# Patient Record
Sex: Female | Born: 1945
Health system: Southern US, Community
[De-identification: ages and names within clinical notes are randomized; demographics above are authoritative.]

## PROBLEM LIST (undated history)

## (undated) DIAGNOSIS — J45909 Unspecified asthma, uncomplicated: Secondary | ICD-10-CM

## (undated) DIAGNOSIS — M5430 Sciatica, unspecified side: Secondary | ICD-10-CM

## (undated) DIAGNOSIS — M545 Low back pain, unspecified: Secondary | ICD-10-CM

## (undated) DIAGNOSIS — I219 Acute myocardial infarction, unspecified: Secondary | ICD-10-CM

## (undated) DIAGNOSIS — J189 Pneumonia, unspecified organism: Secondary | ICD-10-CM

## (undated) DIAGNOSIS — G8929 Other chronic pain: Secondary | ICD-10-CM

## (undated) DIAGNOSIS — E7800 Pure hypercholesterolemia, unspecified: Secondary | ICD-10-CM

## (undated) DIAGNOSIS — I2510 Atherosclerotic heart disease of native coronary artery without angina pectoris: Secondary | ICD-10-CM

## (undated) DIAGNOSIS — I10 Essential (primary) hypertension: Secondary | ICD-10-CM

## (undated) HISTORY — DX: Essential (primary) hypertension: I10

## (undated) HISTORY — DX: Sciatica, unspecified side: M54.30

## (undated) HISTORY — DX: Acute myocardial infarction, unspecified: I21.9

---

## 1978-07-02 HISTORY — PX: SALPINGECTOMY: SHX328

## 2000-07-02 DIAGNOSIS — I219 Acute myocardial infarction, unspecified: Secondary | ICD-10-CM

## 2000-07-02 HISTORY — DX: Acute myocardial infarction, unspecified: I21.9

## 2000-07-02 HISTORY — PX: CORONARY ARTERY BYPASS GRAFT: SHX141

## 2015-05-02 ENCOUNTER — Ambulatory Visit: Payer: Medicare Other | Attending: Family Medicine | Admitting: Family Medicine

## 2015-05-02 ENCOUNTER — Encounter: Payer: Self-pay | Admitting: Family Medicine

## 2015-05-02 VITALS — BP 151/82 | HR 70 | Temp 98.1°F | Resp 16 | Ht 64.0 in | Wt 181.0 lb

## 2015-05-02 DIAGNOSIS — I252 Old myocardial infarction: Secondary | ICD-10-CM

## 2015-05-02 DIAGNOSIS — Z91048 Other nonmedicinal substance allergy status: Secondary | ICD-10-CM

## 2015-05-02 DIAGNOSIS — M2550 Pain in unspecified joint: Secondary | ICD-10-CM

## 2015-05-02 DIAGNOSIS — I10 Essential (primary) hypertension: Secondary | ICD-10-CM | POA: Diagnosis not present

## 2015-05-02 DIAGNOSIS — K219 Gastro-esophageal reflux disease without esophagitis: Secondary | ICD-10-CM | POA: Diagnosis not present

## 2015-05-02 DIAGNOSIS — Z9109 Other allergy status, other than to drugs and biological substances: Secondary | ICD-10-CM

## 2015-05-02 DIAGNOSIS — G4700 Insomnia, unspecified: Secondary | ICD-10-CM | POA: Diagnosis not present

## 2015-05-02 MED ORDER — ENALAPRIL MALEATE 20 MG PO TABS: 20.0000 mg | ORAL_TABLET | Freq: Every day | ORAL | Status: DC

## 2015-05-02 MED ORDER — MONTELUKAST SODIUM 10 MG PO TABS: 10.0000 mg | ORAL_TABLET | Freq: Every day | ORAL | Status: DC

## 2015-05-02 MED ORDER — ASPIRIN 81 MG PO TABS: 81.0000 mg | ORAL_TABLET | Freq: Every day | ORAL | Status: DC

## 2015-05-02 MED ORDER — METOPROLOL SUCCINATE ER 100 MG PO TB24: 100.0000 mg | ORAL_TABLET | Freq: Every day | ORAL | Status: DC

## 2015-05-02 MED ORDER — TRAZODONE HCL 50 MG PO TABS: 25.0000 mg | ORAL_TABLET | Freq: Every evening | ORAL | Status: DC | PRN

## 2015-05-02 MED ORDER — ESOMEPRAZOLE MAGNESIUM 40 MG PO CPDR: 40.0000 mg | DELAYED_RELEASE_CAPSULE | Freq: Every day | ORAL | Status: DC | PRN

## 2015-05-02 MED ORDER — MELOXICAM 15 MG PO TABS: 15.0000 mg | ORAL_TABLET | Freq: Every day | ORAL | Status: DC | PRN

## 2015-05-02 NOTE — Progress Notes (Signed)
Patient ID: [name], female   DOB: 10-01-1945, 69 y.o.   MRN: [id number]   Subjective:  Patient ID: [name], female    DOB: 05-28-1946  Age: 69 y.o. MRN: [id number]  CC: Hypertension and Insomnia   HPI Latondra Gebhart presents for    1. HTN: taking lopressor 100 mg daily. No HA, CP, SOB. Intermittent palpitations.   2. Hx of MI: in 2002. S/p quadruple bypass. She has not established with cardiology since moving to [place]. Taking ASA. No CP or SOB at rest. Stable chest tightness with exertion. Not on statin. Tried lipitor with leg pains in the past.   3. Insomnia: requesting BZ which she used intermittently for anxiety, trouble sleeping and palpitations.   Past Medical History  Diagnosis Date  . Hypertension Dx 1990  . Myocardial infarction [facility]) 2002    Past Surgical History  Procedure Laterality Date  . Coronary artery bypass graft  2002    Quadruple Bypass in [place]    Family History  Problem Relation Age of Onset  . Heart disease Mother   . Heart disease Father   . Heart disease Brother     Social History  Substance Use Topics  . Smoking status: Never Smoker   . Smokeless tobacco: Not on file  . Alcohol Use: No    ROS Review of Systems  Constitutional: Negative for fever and chills.  Eyes: Negative for visual disturbance.  Respiratory: Positive for chest tightness. Negative for shortness of breath.   Cardiovascular: Negative for chest pain.  Gastrointestinal: Negative for abdominal pain and blood in stool.  Musculoskeletal: Negative for back pain and arthralgias.  Skin: Negative for rash.  Allergic/Immunologic: Negative for immunocompromised state.  Hematological: Negative for adenopathy. Does not bruise/bleed easily.  Psychiatric/Behavioral: Positive for sleep disturbance. Negative for suicidal ideas and dysphoric mood.    Objective:   Today's Vitals: BP 151/82 mmHg  Pulse 70  Temp(Src) 98.1 F (36.7 C) (Oral)  Resp 16  Ht   (1.626 m)  Wt 181 lb (82.101 kg)  BMI 31.05 kg/m2  SpO2 96%  Physical Exam  Constitutional: She is oriented to person, place, and time. She appears well-developed and well-nourished. No distress.  HENT:  Head: Normocephalic and atraumatic.  Cardiovascular: Normal rate, regular rhythm, normal heart sounds and intact distal pulses.   Pulmonary/Chest: Effort normal and breath sounds normal.  Musculoskeletal: She exhibits no edema.  Neurological: She is alert and oriented to person, place, and time.  Skin: Skin is warm and dry. No rash noted.  Psychiatric: She has a normal mood and affect.    Assessment & Plan:   No problem-specific assessment & plan notes found for this encounter.   Outpatient Encounter Prescriptions as of 05/02/2015  Medication Sig  . aspirin 81 MG tablet Take 1 tablet (81 mg total) by mouth daily.  . enalapril (VASOTEC) 20 MG tablet Take 1 tablet (20 mg total) by mouth daily.  . meloxicam (MOBIC) 15 MG tablet Take 1 tablet (15 mg total) by mouth daily as needed for pain.  . nitroGLYCERIN (NITROSTAT) 0.4 MG SL tablet Place 0.4 mg under the tongue every 5 (five) minutes as needed for chest pain.  . Omega-3 Fatty Acids (FISH OIL) 1000 MG CAPS Take 1 capsule by mouth.  . [DISCONTINUED] aspirin 81 MG tablet Take 81 mg by mouth daily.  . [DISCONTINUED] enalapril (VASOTEC) 20 MG tablet Take 20 mg by mouth daily.  . [DISCONTINUED] meloxicam (MOBIC) 15 MG tablet Take 15  mg by mouth daily.  . [DISCONTINUED] metoprolol (LOPRESSOR) 100 MG tablet Take 100 mg by mouth once.  Marland Kitchen. esomeprazole (NEXIUM) 40 MG capsule Take 1 capsule (40 mg total) by mouth daily as needed.  . metoprolol succinate (TOPROL-XL) 100 MG 24 hr tablet Take 1 tablet (100 mg total) by mouth daily. Take with or immediately following a meal.  . montelukast (SINGULAIR) 10 MG tablet Take 1 tablet (10 mg total) by mouth at bedtime.  . traZODone (DESYREL) 50 MG tablet Take 0.5-1 tablets (25-50 mg total) by mouth at  bedtime as needed for sleep.  . [DISCONTINUED] esomeprazole (NEXIUM) 40 MG capsule Take 1 capsule (40 mg total) by mouth daily as needed.  . [DISCONTINUED] montelukast (SINGULAIR) 10 MG tablet Take 1 tablet (10 mg total) by mouth at bedtime.   No facility-administered encounter medications on file as of 05/02/2015.    Follow-up: No Follow-up on file.    Dessa PhiJosalyn Amontae Ng MD

## 2015-05-02 NOTE — Progress Notes (Signed)
Establish care  Hx MI 1990 Medicine refills  No Hx tobacco

## 2015-05-02 NOTE — Patient Instructions (Addendum)
Diagnoses and all orders for this visit:  Environmental allergies -     Discontinue: montelukast (SINGULAIR) 10 MG tablet; Take 1 tablet (10 mg total) by mouth at bedtime. -     montelukast (SINGULAIR) 10 MG tablet; Take 1 tablet (10 mg total) by mouth at bedtime.  Essential hypertension -     Ambulatory referral to Cardiology -     enalapril (VASOTEC) 20 MG tablet; Take 1 tablet (20 mg total) by mouth daily. -     metoprolol succinate (TOPROL-XL) 100 MG 24 hr tablet; Take 1 tablet (100 mg total) by mouth daily. Take with or immediately following a meal.  History of MI (myocardial infarction) -     Ambulatory referral to Cardiology -     enalapril (VASOTEC) 20 MG tablet; Take 1 tablet (20 mg total) by mouth daily. -     aspirin 81 MG tablet; Take 1 tablet (81 mg total) by mouth daily. -     metoprolol succinate (TOPROL-XL) 100 MG 24 hr tablet; Take 1 tablet (100 mg total) by mouth daily. Take with or immediately following a meal. -     Lipid Panel; Future -     COMPLETE METABOLIC PANEL WITH GFR; Future  Gastroesophageal reflux disease, esophagitis presence not specified -     Discontinue: esomeprazole (NEXIUM) 40 MG capsule; Take 1 capsule (40 mg total) by mouth daily as needed. -     esomeprazole (NEXIUM) 40 MG capsule; Take 1 capsule (40 mg total) by mouth daily as needed.  Arthralgia -     meloxicam (MOBIC) 15 MG tablet; Take 1 tablet (15 mg total) by mouth daily as needed for pain.  Insomnia -     traZODone (DESYREL) 50 MG tablet; Take 0.5-1 tablets (25-50 mg total) by mouth at bedtime as needed for sleep.    Return on Friday for fasting labs F/u in 4 weeks for give me an update on trazodone as needed for sleep  Dr. Armen PickupFunches

## 2015-05-03 ENCOUNTER — Encounter: Payer: Self-pay | Admitting: Family Medicine

## 2015-05-03 NOTE — Assessment & Plan Note (Signed)
A: intermittent insomnia P: Avoid BZ as first line treatment Trial of trazodone

## 2015-05-03 NOTE — Assessment & Plan Note (Signed)
A: BP elevated on 100 mg lopressor once daily  P: Start toprol XL 100 mg daily

## 2015-05-03 NOTE — Assessment & Plan Note (Signed)
Patient declined statin Taking fish oil Cardiology referral Lipids Release of records form signed

## 2015-05-06 ENCOUNTER — Ambulatory Visit: Payer: Medicaid Other | Attending: Family Medicine

## 2015-05-06 DIAGNOSIS — I252 Old myocardial infarction: Secondary | ICD-10-CM

## 2015-05-06 LAB — COMPLETE METABOLIC PANEL WITH GFR
ALBUMIN: 4 g/dL (ref 3.6–5.1)
ALK PHOS: 77 U/L (ref 33–130)
ALT: 13 U/L (ref 6–29)
AST: 17 U/L (ref 10–35)
BUN: 10 mg/dL (ref 7–25)
CALCIUM: 9.5 mg/dL (ref 8.6–10.4)
CO2: 28 mmol/L (ref 20–31)
Chloride: 104 mmol/L (ref 98–110)
Creat: 0.71 mg/dL (ref 0.50–0.99)
GFR, EST NON AFRICAN AMERICAN: 87 mL/min (ref >=60)
GFR, Est African American: >89 mL/min (ref >=60)
GLUCOSE: 117 mg/dL — AB (ref 65–99)
POTASSIUM: 5.8 mmol/L — AB (ref 3.5–5.3)
SODIUM: 138 mmol/L (ref 135–146)
Total Bilirubin: 0.9 mg/dL (ref 0.2–1.2)
Total Protein: 7.1 g/dL (ref 6.1–8.1)

## 2015-05-06 LAB — LIPID PANEL
CHOL/HDL RATIO: 5.3 ratio — AB (ref <=5.0)
Cholesterol: 217 mg/dL — ABNORMAL HIGH (ref 125–200)
HDL: 41 mg/dL — AB (ref >=46)
LDL Cholesterol: 138 mg/dL — ABNORMAL HIGH (ref <130)
Triglycerides: 189 mg/dL — ABNORMAL HIGH (ref <150)
VLDL: 38 mg/dL — ABNORMAL HIGH (ref <30)

## 2015-05-09 ENCOUNTER — Other Ambulatory Visit: Payer: Self-pay | Admitting: Family Medicine

## 2015-05-09 DIAGNOSIS — I252 Old myocardial infarction: Secondary | ICD-10-CM

## 2015-05-09 DIAGNOSIS — E785 Hyperlipidemia, unspecified: Secondary | ICD-10-CM

## 2015-05-09 MED ORDER — ROSUVASTATIN CALCIUM 20 MG PO TABS: 20.0000 mg | ORAL_TABLET | Freq: Every day | ORAL | Status: DC

## 2015-05-11 ENCOUNTER — Telehealth: Payer: Self-pay | Admitting: *Deleted

## 2015-05-11 NOTE — Telephone Encounter (Signed)
-----   Message from Dessa PhiJosalyn Funches, MD sent at 05/09/2015  9:28 AM EST ----- High cholesterol Given hx of coronary artery disease I recommend high intensity statin I recall that she did not tolerate lipitor in the past   Trial of crestor 20 mg daily  If crestor causes pain then we can lower intensity to pravastatin

## 2015-05-11 NOTE — Telephone Encounter (Signed)
Date of birth verified by pt   Lab results given pt  Start Crestor  Pt verbalized understanding

## 2015-07-12 MED FILL — ENALAPRIL MALEATE 20 MG TAB: 20 | 30 days supply | Qty: 30 | Fill #2

## 2015-07-12 MED FILL — ESOMEPRAZOLE MAG DR 40 MG C: 40 | 30 days supply | Qty: 30 | Fill #2

## 2015-07-12 MED FILL — AMLODIPINE BESYLATE 5 MG TA: 5 | 30 days supply | Qty: 30 | Fill #2

## 2015-07-12 MED FILL — METOPROLOL SUCC ER 50 MG TA: 50 | 30 days supply | Qty: 60 | Fill #2

## 2015-07-12 MED FILL — MELOXICAM 15 MG TABLET: 15 | 30 days supply | Qty: 30 | Fill #2

## 2015-07-26 MED FILL — ?MONTELUKAST SOD 10 MG TAB: 10 | 30 days supply | Qty: 30 | Fill #2

## 2015-07-28 ENCOUNTER — Encounter: Payer: Self-pay | Admitting: Family Medicine

## 2015-07-28 ENCOUNTER — Other Ambulatory Visit: Payer: Self-pay | Admitting: Family Medicine

## 2015-07-28 ENCOUNTER — Ambulatory Visit: Payer: Medicare Other | Attending: Family Medicine | Admitting: Family Medicine

## 2015-07-28 VITALS — BP 124/75 | HR 67 | Temp 98.0°F | Resp 16 | Wt 179.0 lb

## 2015-07-28 DIAGNOSIS — Z1159 Encounter for screening for other viral diseases: Secondary | ICD-10-CM | POA: Diagnosis not present

## 2015-07-28 DIAGNOSIS — G4700 Insomnia, unspecified: Secondary | ICD-10-CM | POA: Insufficient documentation

## 2015-07-28 DIAGNOSIS — I10 Essential (primary) hypertension: Secondary | ICD-10-CM | POA: Diagnosis present

## 2015-07-28 DIAGNOSIS — Z0000 Encounter for general adult medical examination without abnormal findings: Secondary | ICD-10-CM

## 2015-07-28 DIAGNOSIS — Z9109 Other allergy status, other than to drugs and biological substances: Secondary | ICD-10-CM

## 2015-07-28 DIAGNOSIS — Z91048 Other nonmedicinal substance allergy status: Secondary | ICD-10-CM

## 2015-07-28 DIAGNOSIS — Z79899 Other long term (current) drug therapy: Secondary | ICD-10-CM | POA: Insufficient documentation

## 2015-07-28 DIAGNOSIS — K219 Gastro-esophageal reflux disease without esophagitis: Secondary | ICD-10-CM | POA: Insufficient documentation

## 2015-07-28 DIAGNOSIS — M2550 Pain in unspecified joint: Secondary | ICD-10-CM | POA: Diagnosis not present

## 2015-07-28 DIAGNOSIS — Z7982 Long term (current) use of aspirin: Secondary | ICD-10-CM | POA: Insufficient documentation

## 2015-07-28 DIAGNOSIS — I252 Old myocardial infarction: Secondary | ICD-10-CM | POA: Insufficient documentation

## 2015-07-28 LAB — POCT GLYCOSYLATED HEMOGLOBIN (HGB A1C): HEMOGLOBIN A1C: 6.7

## 2015-07-28 MED ORDER — ASPIRIN 81 MG PO TABS: 81.0000 mg | ORAL_TABLET | Freq: Every day | ORAL | Status: DC

## 2015-07-28 MED ORDER — ESOMEPRAZOLE MAGNESIUM 40 MG PO CPDR: 40.0000 mg | DELAYED_RELEASE_CAPSULE | Freq: Every day | ORAL | Status: DC | PRN

## 2015-07-28 MED ORDER — MONTELUKAST SODIUM 10 MG PO TABS: 10.0000 mg | ORAL_TABLET | Freq: Every day | ORAL | Status: DC

## 2015-07-28 MED ORDER — METOPROLOL SUCCINATE ER 100 MG PO TB24: 100.0000 mg | ORAL_TABLET | Freq: Every day | ORAL | Status: DC

## 2015-07-28 MED ORDER — MELOXICAM 15 MG PO TABS: 15.0000 mg | ORAL_TABLET | Freq: Every day | ORAL | Status: DC | PRN

## 2015-07-28 MED ORDER — TRAZODONE HCL 50 MG PO TABS: 25.0000 mg | ORAL_TABLET | Freq: Every evening | ORAL | Status: DC | PRN

## 2015-07-28 MED ORDER — ENALAPRIL MALEATE 20 MG PO TABS: 20.0000 mg | ORAL_TABLET | Freq: Every day | ORAL | Status: DC

## 2015-07-28 NOTE — Patient Instructions (Addendum)
[name] was seen today for hypertension.  Diagnoses and all orders for this visit:  Healthcare maintenance -     POCT glycosylated hemoglobin (Hb A1C)  Need for hepatitis C screening test -     Hepatitis C antibody, reflex  History of MI (myocardial infarction) -     aspirin 81 MG tablet; Take 1 tablet (81 mg total) by mouth daily. -     enalapril (VASOTEC) 20 MG tablet; Take 1 tablet (20 mg total) by mouth daily. -     metoprolol succinate (TOPROL-XL) 100 MG 24 hr tablet; Take 1 tablet (100 mg total) by mouth daily. Take with or immediately following a meal.  Essential hypertension -     enalapril (VASOTEC) 20 MG tablet; Take 1 tablet (20 mg total) by mouth daily. -     metoprolol succinate (TOPROL-XL) 100 MG 24 hr tablet; Take 1 tablet (100 mg total) by mouth daily. Take with or immediately following a meal.  Gastroesophageal reflux disease, esophagitis presence not specified -     esomeprazole (NEXIUM) 40 MG capsule; Take 1 capsule (40 mg total) by mouth daily as needed.  Arthralgia -     meloxicam (MOBIC) 15 MG tablet; Take 1 tablet (15 mg total) by mouth daily as needed for pain.  Environmental allergies -     montelukast (SINGULAIR) 10 MG tablet; Take 1 tablet (10 mg total) by mouth at bedtime.  Insomnia -     traZODone (DESYREL) 50 MG tablet; Take 0.5-1 tablets (25-50 mg total) by mouth at bedtime as needed for sleep.   All meds refilled  Please follow up in 3 months for HTN  Dr. Armen Pickup

## 2015-07-28 NOTE — Progress Notes (Signed)
F/u HTN  C/c back pain  Pain scale # 5 No tobacco user  No suicidal thoughts in the past two weeks

## 2015-07-28 NOTE — Progress Notes (Signed)
Subjective:  Patient ID: [name], female    DOB: May 23, 1946  Age: 70 y.o. MRN: [id number]  CC: Hypertension   HPI [name] presents for   CHRONIC HYPERTENSION  Disease Monitoring  Blood pressure range: not checking   Chest pain: no   Dyspnea: no   Claudication: no   Medication compliance: yes  Medication Side Effects  Lightheadedness: no   Urinary frequency: no   Edema: no    Preventitive Healthcare:  Exercise: yes   Diet Pattern: low calorie   Salt Restriction: yes   2.  Hx of MI: tried crestor. Had severe aches. Same experience with lipitor in the past. Stopped crestor. Declines trial of another statin. Report taking daily qunol.   Social History  Substance Use Topics  . Smoking status: Never Smoker   . Smokeless tobacco: Not on file  . Alcohol Use: No      Outpatient Prescriptions Prior to Visit  Medication Sig Dispense Refill  . aspirin 81 MG tablet Take 1 tablet (81 mg total) by mouth daily. 30 tablet 11  . enalapril (VASOTEC) 20 MG tablet Take 1 tablet (20 mg total) by mouth daily. 30 tablet 11  . esomeprazole (NEXIUM) 40 MG capsule Take 1 capsule (40 mg total) by mouth daily as needed. 30 capsule 3  . meloxicam (MOBIC) 15 MG tablet Take 1 tablet (15 mg total) by mouth daily as needed for pain. 30 tablet 5  . metoprolol succinate (TOPROL-XL) 100 MG 24 hr tablet Take 1 tablet (100 mg total) by mouth daily. Take with or immediately following a meal. 30 tablet 11  . montelukast (SINGULAIR) 10 MG tablet Take 1 tablet (10 mg total) by mouth at bedtime. 30 tablet 3  . nitroGLYCERIN (NITROSTAT) 0.4 MG SL tablet Place 0.4 mg under the tongue every 5 (five) minutes as needed for chest pain.    . Omega-3 Fatty Acids (FISH OIL) 1000 MG CAPS Take 1 capsule by mouth.    . traZODone (DESYREL) 50 MG tablet Take 0.5-1 tablets (25-50 mg total) by mouth at bedtime as needed for sleep. 30 tablet 0  . rosuvastatin (CRESTOR) 20 MG tablet Take 1 tablet (20 mg total)  by mouth daily. (Patient not taking: Reported on 07/28/2015) 30 tablet 2   No facility-administered medications prior to visit.    ROS Review of Systems  Constitutional: Negative for fever and chills.  Eyes: Negative for visual disturbance.  Respiratory: Negative for shortness of breath.   Cardiovascular: Negative for chest pain.  Gastrointestinal: Negative for abdominal pain and blood in stool.  Musculoskeletal: Negative for back pain and arthralgias.  Skin: Negative for rash.  Allergic/Immunologic: Negative for immunocompromised state.  Hematological: Negative for adenopathy. Does not bruise/bleed easily.  Psychiatric/Behavioral: Negative for suicidal ideas and dysphoric mood.    Objective:  BP 124/75 mmHg  Pulse 67  Temp(Src) 98 F (36.7 C) (Oral)  Resp 16  Wt 179 lb (81.194 kg)  SpO2 96%  BP/Weight 07/28/2015 05/02/2015  Systolic BP 124 151  Diastolic BP 75 82  Wt. (Lbs) 179 181  BMI 30.71 31.05    Physical Exam  Constitutional: She is oriented to person, place, and time. She appears well-developed and well-nourished. No distress.  HENT:  Head: Normocephalic and atraumatic.  Cardiovascular: Normal rate, regular rhythm, normal heart sounds and intact distal pulses.   Pulmonary/Chest: Effort normal and breath sounds normal.  Musculoskeletal: She exhibits no edema.  Neurological: She is alert and oriented to person, place, and time.  Skin: Skin is warm and dry. No rash noted.  Psychiatric: She has a normal mood and affect.   Lab Results  Component Value Date   HGBA1C 6.70 07/28/2015     Assessment & Plan:   Ardel was seen today for hypertension.  Diagnoses and all orders for this visit:  Healthcare maintenance -     POCT glycosylated hemoglobin (Hb A1C)  Need for hepatitis C screening test -     Hepatitis C antibody, reflex  History of MI (myocardial infarction) -     aspirin 81 MG tablet; Take 1 tablet (81 mg total) by mouth daily. -     enalapril  (VASOTEC) 20 MG tablet; Take 1 tablet (20 mg total) by mouth daily. -     metoprolol succinate (TOPROL-XL) 100 MG 24 hr tablet; Take 1 tablet (100 mg total) by mouth daily. Take with or immediately following a meal.  Essential hypertension -     enalapril (VASOTEC) 20 MG tablet; Take 1 tablet (20 mg total) by mouth daily. -     metoprolol succinate (TOPROL-XL) 100 MG 24 hr tablet; Take 1 tablet (100 mg total) by mouth daily. Take with or immediately following a meal.  Gastroesophageal reflux disease, esophagitis presence not specified -     esomeprazole (NEXIUM) 40 MG capsule; Take 1 capsule (40 mg total) by mouth daily as needed.  Arthralgia -     meloxicam (MOBIC) 15 MG tablet; Take 1 tablet (15 mg total) by mouth daily as needed for pain.  Environmental allergies -     montelukast (SINGULAIR) 10 MG tablet; Take 1 tablet (10 mg total) by mouth at bedtime.  Insomnia -     traZODone (DESYREL) 50 MG tablet; Take 0.5-1 tablets (25-50 mg total) by mouth at bedtime as needed for sleep.    No orders of the defined types were placed in this encounter.    Follow-up: No Follow-up on file.   Dessa Phi MD

## 2015-07-28 NOTE — Assessment & Plan Note (Signed)
A: Blood pressure at goal. Meds: compliant  P: I will continue the patient's current medication regimen since her blood pressure is at goal.   

## 2015-07-28 NOTE — Assessment & Plan Note (Signed)
Intolerant of statin  Statin DC/d

## 2015-07-29 LAB — HEPATITIS C ANTIBODY: HCV Ab: NEGATIVE

## 2015-08-08 ENCOUNTER — Telehealth: Payer: Self-pay | Admitting: *Deleted

## 2015-08-08 NOTE — Telephone Encounter (Signed)
Date of birth verified by pt  Lab results given Pt verbalized understanding 

## 2015-08-08 NOTE — Telephone Encounter (Signed)
-----   Message from Dessa Phi, MD sent at 07/29/2015  8:33 AM EST ----- Screening hep C negative

## 2015-08-10 MED FILL — METOPROLOL SUCC ER 50 MG TA: 50 | 30 days supply | Qty: 60 | Fill #3

## 2015-08-10 MED FILL — MELOXICAM 15 MG TABLET: 15 | 30 days supply | Qty: 30 | Fill #3

## 2015-08-10 MED FILL — AMLODIPINE BESYLATE 5 MG TA: 5 | 30 days supply | Qty: 30 | Fill #3

## 2015-08-11 MED FILL — ENALAPRIL MALEATE 20 MG TAB: 20 | 30 days supply | Qty: 30 | Fill #3

## 2015-08-24 MED FILL — MONTELUKAST SOD 10 MG TAB: 10 | 30 days supply | Qty: 30 | Fill #0

## 2015-08-26 ENCOUNTER — Other Ambulatory Visit: Payer: Self-pay | Admitting: Family Medicine

## 2015-08-26 DIAGNOSIS — K219 Gastro-esophageal reflux disease without esophagitis: Secondary | ICD-10-CM

## 2015-08-26 MED ORDER — PANTOPRAZOLE SODIUM 40 MG PO TBEC: 40.0000 mg | DELAYED_RELEASE_TABLET | Freq: Every day | ORAL | Status: DC

## 2015-09-12 MED FILL — MELOXICAM 15 MG TABLET: 15 | 30 days supply | Qty: 30 | Fill #4

## 2015-09-12 MED FILL — METOPROLOL SUCC ER 50 MG TA: 50 | 30 days supply | Qty: 60 | Fill #4

## 2015-09-12 MED FILL — ENALAPRIL MALEATE 20 MG TAB: 20 | 30 days supply | Qty: 30 | Fill #4

## 2015-09-12 MED FILL — AMLODIPINE BESYLATE 5 MG TA: 5 | 30 days supply | Qty: 30 | Fill #4

## 2015-10-04 MED FILL — MONTELUKAST SOD 10 MG TAB: 10 | 30 days supply | Qty: 30 | Fill #1

## 2015-10-17 MED FILL — METOPROLOL SUCC ER 50 MG TA: 50 | 30 days supply | Qty: 60 | Fill #5

## 2015-10-17 MED FILL — MELOXICAM 15 MG TABLET: 15 | 30 days supply | Qty: 30 | Fill #5

## 2015-10-17 MED FILL — AMLODIPINE BESYLATE 5 MG TA: 5 | 30 days supply | Qty: 30 | Fill #5

## 2015-10-17 MED FILL — ENALAPRIL MALEATE 20 MG TAB: 20 | 30 days supply | Qty: 30 | Fill #5

## 2015-10-20 ENCOUNTER — Encounter: Payer: Self-pay | Admitting: Family Medicine

## 2015-10-20 ENCOUNTER — Ambulatory Visit: Payer: Medicare Other | Attending: Family Medicine | Admitting: Family Medicine

## 2015-10-20 VITALS — BP 117/72 | HR 64 | Temp 97.9°F | Resp 16 | Ht 64.0 in | Wt 178.0 lb

## 2015-10-20 DIAGNOSIS — R739 Hyperglycemia, unspecified: Secondary | ICD-10-CM | POA: Diagnosis not present

## 2015-10-20 DIAGNOSIS — I252 Old myocardial infarction: Secondary | ICD-10-CM

## 2015-10-20 DIAGNOSIS — M2550 Pain in unspecified joint: Secondary | ICD-10-CM

## 2015-10-20 DIAGNOSIS — Z91048 Other nonmedicinal substance allergy status: Secondary | ICD-10-CM

## 2015-10-20 DIAGNOSIS — K219 Gastro-esophageal reflux disease without esophagitis: Secondary | ICD-10-CM

## 2015-10-20 DIAGNOSIS — I10 Essential (primary) hypertension: Secondary | ICD-10-CM

## 2015-10-20 DIAGNOSIS — E785 Hyperlipidemia, unspecified: Secondary | ICD-10-CM

## 2015-10-20 DIAGNOSIS — H01139 Eczematous dermatitis of unspecified eye, unspecified eyelid: Secondary | ICD-10-CM | POA: Insufficient documentation

## 2015-10-20 DIAGNOSIS — G4700 Insomnia, unspecified: Secondary | ICD-10-CM

## 2015-10-20 DIAGNOSIS — Z9109 Other allergy status, other than to drugs and biological substances: Secondary | ICD-10-CM

## 2015-10-20 DIAGNOSIS — M79601 Pain in right arm: Secondary | ICD-10-CM | POA: Insufficient documentation

## 2015-10-20 LAB — POCT GLYCOSYLATED HEMOGLOBIN (HGB A1C): HEMOGLOBIN A1C: 6.2

## 2015-10-20 LAB — GLUCOSE, POCT (MANUAL RESULT ENTRY): POC Glucose: 124 mg/dl — AB (ref 70–99)

## 2015-10-20 MED ORDER — MONTELUKAST SODIUM 10 MG PO TABS: 10.0000 mg | ORAL_TABLET | Freq: Every day | ORAL | Status: DC

## 2015-10-20 MED ORDER — PANTOPRAZOLE SODIUM 40 MG PO TBEC: 40.0000 mg | DELAYED_RELEASE_TABLET | Freq: Every day | ORAL | Status: DC

## 2015-10-20 MED ORDER — DICLOFENAC SODIUM 1 % TD GEL: 2.0000 g | Freq: Four times a day (QID) | TRANSDERMAL | Status: DC | PRN

## 2015-10-20 MED ORDER — TRAZODONE HCL 50 MG PO TABS: 25.0000 mg | ORAL_TABLET | Freq: Every evening | ORAL | Status: DC | PRN

## 2015-10-20 MED ORDER — HYDROCORTISONE VALERATE 0.2 % EX OINT: 1.0000 "application " | TOPICAL_OINTMENT | Freq: Two times a day (BID) | CUTANEOUS | Status: DC | PRN

## 2015-10-20 MED ORDER — METOPROLOL SUCCINATE ER 100 MG PO TB24: 100.0000 mg | ORAL_TABLET | Freq: Every day | ORAL | Status: DC

## 2015-10-20 MED ORDER — MELOXICAM 15 MG PO TABS: 15.0000 mg | ORAL_TABLET | Freq: Every day | ORAL | Status: DC | PRN

## 2015-10-20 MED ORDER — ROSUVASTATIN CALCIUM 20 MG PO TABS
10.0000 mg | ORAL_TABLET | ORAL | Status: DC
Start: 2015-10-20 — End: 2016-04-21

## 2015-10-20 MED ORDER — ENALAPRIL MALEATE 20 MG PO TABS: 20.0000 mg | ORAL_TABLET | Freq: Every day | ORAL | Status: DC

## 2015-10-20 MED ORDER — ASPIRIN 81 MG PO TABS: 81.0000 mg | ORAL_TABLET | Freq: Every day | ORAL | Status: AC

## 2015-10-20 NOTE — Patient Instructions (Signed)
Sierra DavenportSandra was seen today for arm pain.  Diagnoses and all orders for this visit:  Hyperglycemia -     POCT glycosylated hemoglobin (Hb A1C) -     POCT glucose (manual entry)  History of MI (myocardial infarction) -     rosuvastatin (CRESTOR) 20 MG tablet; Take 0.5 tablets (10 mg total) by mouth every other day. -     enalapril (VASOTEC) 20 MG tablet; Take 1 tablet (20 mg total) by mouth daily. -     metoprolol succinate (TOPROL-XL) 100 MG 24 hr tablet; Take 1 tablet (100 mg total) by mouth daily. Take with or immediately following a meal. -     aspirin 81 MG tablet; Take 1 tablet (81 mg total) by mouth daily.  Hyperlipidemia -     rosuvastatin (CRESTOR) 20 MG tablet; Take 0.5 tablets (10 mg total) by mouth every other day.  Arthralgia -     meloxicam (MOBIC) 15 MG tablet; Take 1 tablet (15 mg total) by mouth daily as needed for pain.  Essential hypertension -     enalapril (VASOTEC) 20 MG tablet; Take 1 tablet (20 mg total) by mouth daily. -     metoprolol succinate (TOPROL-XL) 100 MG 24 hr tablet; Take 1 tablet (100 mg total) by mouth daily. Take with or immediately following a meal.  Environmental allergies -     montelukast (SINGULAIR) 10 MG tablet; Take 1 tablet (10 mg total) by mouth at bedtime.  Gastroesophageal reflux disease, esophagitis presence not specified -     pantoprazole (PROTONIX) 40 MG tablet; Take 1 tablet (40 mg total) by mouth daily.  Insomnia -     traZODone (DESYREL) 50 MG tablet; Take 0.5-1 tablets (25-50 mg total) by mouth at bedtime as needed for sleep.  Eczematous dermatitis of eyelid, unspecified laterality -     hydrocortisone valerate ointment (WEST-CORT) 0.2 %; Apply 1 application topically 2 (two) times daily as needed. Eyelid dermatitis  Right arm pain -     diclofenac sodium (VOLTAREN) 1 % GEL; Apply 2 g topically 4 (four) times daily as needed (R bicep pain).   90 day supply ordered on all meds  F/u in 6 months for check up  Dr. Armen PickupFunches

## 2015-10-20 NOTE — Progress Notes (Signed)
C/C arm pain x 1 weeks no injury  No tobacco user  Pain scale # 0 Pt stated taking Crestor  1/2 pill every other day  No suicidal thoughts in the past two weeks

## 2015-10-20 NOTE — Progress Notes (Signed)
Subjective:  Patient ID: [name], female    DOB: 09/06/1945  Age: 70 y.o. MRN: [id number]  CC: No chief complaint on file.   HPI [name] presents for   CHRONIC HYPERTENSION  Disease Monitoring  Blood pressure range: not checking   Chest pain: no   Dyspnea: no   Claudication: no   Medication compliance: yes  Medication Side Effects  Lightheadedness: no   Urinary frequency: no   Edema: no    Preventitive Healthcare:  Exercise: yes   Diet Pattern: low calorie   Salt Restriction: yes   2.  Hx of MI: tried crestor. Had severe aches. Same experience with lipitor in the past. She stopped crestor for a while. But restarted at the urging of her cardiologist. She is taking 10 mg every other day with a mobic and tolerating it well.   3. R bicep pain: no pain now. Mobic helps most of the time but not all of the time. Pain exacerbated by reaching backward. No trauma. No arm swelling. She walks with 4 lb weights in each hand for exercise.   4. Eyelid dermatitis: gets itchy or dry eyelids that stings at times. She was treated with an ointment in the past but cannot recall the name. No eye swelling or trauma.   Social History  Substance Use Topics  . Smoking status: Never Smoker   . Smokeless tobacco: Not on file  . Alcohol Use: No      Outpatient Prescriptions Prior to Visit  Medication Sig Dispense Refill  . aspirin 81 MG tablet Take 1 tablet (81 mg total) by mouth daily. 30 tablet 11  . enalapril (VASOTEC) 20 MG tablet Take 1 tablet (20 mg total) by mouth daily. 30 tablet 11  . meloxicam (MOBIC) 15 MG tablet Take 1 tablet (15 mg total) by mouth daily as needed for pain. 30 tablet 5  . metoprolol succinate (TOPROL-XL) 100 MG 24 hr tablet Take 1 tablet (100 mg total) by mouth daily. Take with or immediately following a meal. 30 tablet 11  . montelukast (SINGULAIR) 10 MG tablet Take 1 tablet (10 mg total) by mouth at bedtime. 30 tablet 3  . nitroGLYCERIN  (NITROSTAT) 0.4 MG SL tablet Place 0.4 mg under the tongue every 5 (five) minutes as needed for chest pain.    . NON FORMULARY qunol OTC daily    . Omega-3 Fatty Acids (FISH OIL) 1000 MG CAPS Take 1 capsule by mouth.    . pantoprazole (PROTONIX) 40 MG tablet Take 1 tablet (40 mg total) by mouth daily. 30 tablet 3  . traZODone (DESYREL) 50 MG tablet Take 0.5-1 tablets (25-50 mg total) by mouth at bedtime as needed for sleep. 30 tablet 0   No facility-administered medications prior to visit.    ROS Review of Systems  Constitutional: Negative for fever and chills.  Eyes: Negative for visual disturbance.  Respiratory: Negative for shortness of breath.   Cardiovascular: Negative for chest pain.  Gastrointestinal: Negative for abdominal pain and blood in stool.  Musculoskeletal: Positive for myalgias and arthralgias. Negative for back pain.  Skin: Negative for rash.  Allergic/Immunologic: Negative for immunocompromised state.  Hematological: Negative for adenopathy. Does not bruise/bleed easily.  Psychiatric/Behavioral: Negative for suicidal ideas and dysphoric mood.    Objective:  BP 117/72 mmHg  Pulse 64  Temp(Src) 97.9 F (36.6 C) (Oral)  Resp 16  Ht 5\' 4"  (1.626 m)  Wt 178 lb (80.74 kg)  BMI 30.54 kg/m2  SpO2 96%  BP/Weight 10/20/2015 07/28/2015 05/02/2015  Systolic BP 117 124 151  Diastolic BP 72 75 82  Wt. (Lbs) 178 179 181  BMI 30.54 30.71 31.05    Physical Exam  Constitutional: She is oriented to person, place, and time. She appears well-developed and well-nourished. No distress.  HENT:  Head: Normocephalic and atraumatic.  Eyes: EOM are normal.  Cardiovascular: Normal rate, regular rhythm, normal heart sounds and intact distal pulses.   Pulmonary/Chest: Effort normal and breath sounds normal.  Musculoskeletal: She exhibits no edema.       Arms: Neurological: She is alert and oriented to person, place, and time.  Skin: Skin is warm and dry. No rash noted.  Dry and  dark skin on eye lids No masses No swelling  No eye discharge   Psychiatric: She has a normal mood and affect.   Lab Results  Component Value Date   HGBA1C 6.70 07/28/2015    Lab Results  Component Value Date   HGBA1C 6.2 10/20/2015   CBG 124 Assessment & Plan:   Lamoine was seen today for arm pain.  Diagnoses and all orders for this visit:  Hyperglycemia -     POCT glycosylated hemoglobin (Hb A1C) -     POCT glucose (manual entry)    No orders of the defined types were placed in this encounter.    Follow-up: No Follow-up on file.   Dessa Phi MD

## 2015-10-20 NOTE — Assessment & Plan Note (Signed)
R bicep pains No trauma no evidence of ruptured tendon Voltaren gel ordered

## 2015-10-20 NOTE — Assessment & Plan Note (Signed)
Well controlled Compliant Continue current regimen  

## 2015-10-20 NOTE — Assessment & Plan Note (Signed)
Eczema of eyelid 0.2% hydrocortisone ointment ordered

## 2015-11-07 MED FILL — HYDROCORTISONE VAL 0.2% OIN: 0.2 | 15 days supply | Qty: 45 | Fill #0 | Status: TO

## 2015-11-07 MED FILL — MONTELUKAST SOD 10 MG TAB: 10 | 30 days supply | Qty: 30 | Fill #0

## 2015-11-07 MED FILL — VOLTAREN 1% GEL: 1 | 12 days supply | Qty: 100 | Fill #0 | Status: TO

## 2015-11-15 MED FILL — METOPROLOL SUCC ER 50 MG TA: 50 | 30 days supply | Qty: 60 | Fill #6

## 2015-11-15 MED FILL — AMLODIPINE BESYLATE 5 MG TA: 5 | 30 days supply | Qty: 30 | Fill #6

## 2015-11-15 MED FILL — MELOXICAM 15 MG TABLET: 15 | 30 days supply | Qty: 30 | Fill #0

## 2015-11-16 MED FILL — ENALAPRIL MALEATE 20 MG TAB: 20 | 30 days supply | Qty: 30 | Fill #6

## 2015-12-14 MED FILL — MELOXICAM 15 MG TABLET: 15 | 30 days supply | Qty: 30 | Fill #1

## 2015-12-14 MED FILL — ROSUVASTATIN CALCIUM 20 MG: 20 | 60 days supply | Qty: 30 | Fill #0 | Status: TO

## 2015-12-14 MED FILL — METOPROLOL SUCC ER 50 MG TA: 50 | 30 days supply | Qty: 60 | Fill #7

## 2015-12-14 MED FILL — ENALAPRIL MALEATE 20 MG TAB: 20 | 30 days supply | Qty: 30 | Fill #7

## 2015-12-15 MED FILL — MONTELUKAST SOD 10 MG TAB: 10 | 30 days supply | Qty: 30 | Fill #1

## 2015-12-27 ENCOUNTER — Encounter: Payer: Self-pay | Admitting: Family Medicine

## 2016-01-05 MED FILL — AMLODIPINE BESYLATE 5 MG TA: 5 | 30 days supply | Qty: 30 | Fill #0

## 2016-01-12 ENCOUNTER — Encounter: Payer: Self-pay | Admitting: Family Medicine

## 2016-01-16 MED FILL — MONTELUKAST SOD 10 MG TAB: 10 | 30 days supply | Qty: 30 | Fill #2

## 2016-01-16 MED FILL — METOPROLOL SUCC ER 50 MG TA: 50 | 30 days supply | Qty: 60 | Fill #8

## 2016-01-16 MED FILL — ENALAPRIL MALEATE 20 MG TAB: 20 | 30 days supply | Qty: 30 | Fill #8

## 2016-01-16 MED FILL — MELOXICAM 15 MG TABLET: 15 | 30 days supply | Qty: 30 | Fill #2

## 2016-02-08 MED FILL — ROSUVASTATIN CAL 20 MG TAB: 20 | 30 days supply | Qty: 30 | Fill #2

## 2016-02-08 MED FILL — AMLODIPINE BESYLATE 5 MG TA: 5 | 30 days supply | Qty: 30 | Fill #1 | Status: TO

## 2016-02-15 MED FILL — MONTELUKAST SOD 10 MG TAB: 10 | 30 days supply | Qty: 30 | Fill #3 | Status: TO

## 2016-02-15 MED FILL — ENALAPRIL MALEATE 20 MG TAB: 20 | 30 days supply | Qty: 30 | Fill #9 | Status: TO

## 2016-02-15 MED FILL — METOPROLOL SUCC ER 50 MG TA: 50 | 30 days supply | Qty: 60 | Fill #9 | Status: TO

## 2016-02-15 MED FILL — MELOXICAM 15 MG TABLET: 15 | 30 days supply | Qty: 30 | Fill #3 | Status: TO

## 2016-02-23 ENCOUNTER — Emergency Department (HOSPITAL_COMMUNITY)
Admission: EM | Admit: 2016-02-23 | Discharge: 2016-02-23 | Disposition: A | Discharge status: Home / Self Care | Payer: Medicare Other | Attending: Emergency Medicine | Admitting: Emergency Medicine

## 2016-02-23 ENCOUNTER — Encounter (HOSPITAL_COMMUNITY): Payer: Self-pay

## 2016-02-23 DIAGNOSIS — I10 Essential (primary) hypertension: Secondary | ICD-10-CM | POA: Diagnosis not present

## 2016-02-23 DIAGNOSIS — Z7982 Long term (current) use of aspirin: Secondary | ICD-10-CM | POA: Insufficient documentation

## 2016-02-23 DIAGNOSIS — M5442 Lumbago with sciatica, left side: Secondary | ICD-10-CM | POA: Insufficient documentation

## 2016-02-23 DIAGNOSIS — Z951 Presence of aortocoronary bypass graft: Secondary | ICD-10-CM | POA: Diagnosis not present

## 2016-02-23 DIAGNOSIS — M545 Low back pain: Secondary | ICD-10-CM | POA: Diagnosis present

## 2016-02-23 DIAGNOSIS — I252 Old myocardial infarction: Secondary | ICD-10-CM | POA: Insufficient documentation

## 2016-02-23 MED ORDER — PREDNISONE 20 MG PO TABS: 40.0000 mg | ORAL_TABLET | Freq: Every day | ORAL | 0 refills | Status: DC

## 2016-02-23 MED ORDER — TRAMADOL HCL 50 MG PO TABS: 50.0000 mg | ORAL_TABLET | Freq: Two times a day (BID) | ORAL | 0 refills | Status: DC | PRN

## 2016-02-23 MED ORDER — PREDNISONE 20 MG PO TABS
60.0000 mg | ORAL_TABLET | Freq: Once | ORAL | Status: AC
  Administered 2016-02-23: 60 mg via ORAL
  Filled 2016-02-23: qty 3

## 2016-02-23 MED ORDER — CYCLOBENZAPRINE HCL 10 MG PO TABS: 10.0000 mg | ORAL_TABLET | Freq: Two times a day (BID) | ORAL | 0 refills | Status: DC | PRN

## 2016-02-23 MED ORDER — CYCLOBENZAPRINE HCL 10 MG PO TABS
10.0000 mg | ORAL_TABLET | Freq: Once | ORAL | Status: AC
  Administered 2016-02-23: 10 mg via ORAL
  Filled 2016-02-23: qty 1

## 2016-02-23 MED ORDER — KETOROLAC TROMETHAMINE 60 MG/2ML IM SOLN
30.0000 mg | Freq: Once | INTRAMUSCULAR | Status: AC
  Administered 2016-02-23: 30 mg via INTRAMUSCULAR
  Filled 2016-02-23: qty 2

## 2016-02-23 NOTE — ED Provider Notes (Signed)
Medical screening examination/treatment/procedure(s) were conducted as a shared visit with non-physician practitioner(s) and myself.  I personally evaluated the patient during the encounter. Briefly, the patient is a 70 y.o. female presents with lower back pain with left-sided sciatica. No red flags in history or on exam. Likely muscle strain/spasm. Symptomatic management with analgesia and muscle relaxers appropriate at this time. Patient given recommendations for stretching exercises. No suspicion for cauda equina, AAA. Safe for discharge with strict return precautions.     Nira ConnPedro Eduardo Marylu Dudenhoeffer, MD 02/24/16 [phone]

## 2016-02-23 NOTE — ED Provider Notes (Signed)
[facility]-EMERGENCY DEPT Provider Note   CSN: [id number] Arrival date & time: 02/23/16  4098     History   Chief Complaint Chief Complaint  Patient presents with  . Back Pain    HPI [name] is a 70 y.o. female.  HPI   Patient is a 70 year old female with history of hypertension, CABG, she presents to the emergency room with complaint of low back pain in the left side that extends to her left buttocks and radiates down her leg. She has had this pain for the past 5 days with some mild improvement with applying heat and icy hot and taking ibuprofen. She rates her pain 8 out of 10, described as a shooting pain, exacerbated after sitting for periods of time also worse in the evenings when getting out of bed in the morning. No other alleviating or aggravating factors.  She denies any numbness, tingling, weakness, flank pain, dysuria, abdominal pain. She denies any bladder or bowel incontinence, no fever, no history of IV drug use, cancer, prolonged steroid use.  She pain began she is able to walk and even continues to get on the treadmill 40 minutes a day.    Past Medical History:  Diagnosis Date  . Hypertension Dx 1990  . Myocardial infarction [facility]) 2002    Patient Active Problem List   Diagnosis Date Noted  . Eczematous dermatitis of eyelid 10/20/2015  . Right arm pain 10/20/2015  . Hyperlipidemia 05/09/2015  . Environmental allergies 05/02/2015  . GERD (gastroesophageal reflux disease) 05/02/2015  . Insomnia 05/02/2015  . Arthralgia 05/02/2015  . Hypertension   . History of MI (myocardial infarction)     Past Surgical History:  Procedure Laterality Date  . CORONARY ARTERY BYPASS GRAFT  2002   Quadruple Bypass in [place]    OB History    No data available       Home Medications    Prior to Admission medications   Medication Sig Start Date End Date Taking? Authorizing Provider  amLODipine (NORVASC) 5 MG tablet Take 5 mg by mouth daily.   Yes Historical  Provider, MD  aspirin 81 MG tablet Take 1 tablet (81 mg total) by mouth daily. 10/20/15  Yes Josalyn Funches, MD  calcium carbonate (OS-CAL) 1250 (500 Ca) MG chewable tablet Chew 1 tablet by mouth daily.   Yes Historical Provider, MD  enalapril (VASOTEC) 20 MG tablet Take 1 tablet (20 mg total) by mouth daily. 10/20/15  Yes Josalyn Funches, MD  KRILL OIL PO Take 1 capsule by mouth daily.   Yes Historical Provider, MD  meloxicam (MOBIC) 15 MG tablet Take 1 tablet (15 mg total) by mouth daily as needed for pain. 10/20/15  Yes Josalyn Funches, MD  metoprolol succinate (TOPROL-XL) 100 MG 24 hr tablet Take 1 tablet (100 mg total) by mouth daily. Take with or immediately following a meal. 10/20/15  Yes Josalyn Funches, MD  montelukast (SINGULAIR) 10 MG tablet Take 1 tablet (10 mg total) by mouth at bedtime. 10/20/15  Yes Josalyn Funches, MD  nitroGLYCERIN (NITROSTAT) 0.4 MG SL tablet Place 0.4 mg under the tongue every 5 (five) minutes as needed for chest pain. Reported on 10/20/2015   Yes Historical Provider, MD  NON FORMULARY qunol OTC daily   Yes Historical Provider, MD  rosuvastatin (CRESTOR) 20 MG tablet Take 0.5 tablets (10 mg total) by mouth every other day. 10/20/15  Yes Josalyn Funches, MD  cyclobenzaprine (FLEXERIL) 10 MG tablet Take 1 tablet (10 mg total) by mouth 2 (  two) times daily as needed for muscle spasms. 02/23/16   Danelle Berry, PA-C  diclofenac sodium (VOLTAREN) 1 % GEL Apply 2 g topically 4 (four) times daily as needed (R bicep pain). Patient not taking: Reported on 02/23/2016 10/20/15   Dessa Phi, MD  hydrocortisone valerate ointment (WEST-CORT) 0.2 % Apply 1 application topically 2 (two) times daily as needed. Eyelid dermatitis Patient not taking: Reported on 02/23/2016 10/20/15   Dessa Phi, MD  pantoprazole (PROTONIX) 40 MG tablet Take 1 tablet (40 mg total) by mouth daily. Patient not taking: Reported on 02/23/2016 10/20/15   Dessa Phi, MD  predniSONE (DELTASONE) 20 MG tablet  Take 2 tablets (40 mg total) by mouth daily. 02/23/16   Danelle Berry, PA-C  traMADol (ULTRAM) 50 MG tablet Take 1 tablet (50 mg total) by mouth every 12 (twelve) hours as needed for severe pain. 02/23/16   Danelle Berry, PA-C  traZODone (DESYREL) 50 MG tablet Take 0.5-1 tablets (25-50 mg total) by mouth at bedtime as needed for sleep. Patient not taking: Reported on 02/23/2016 10/20/15   Dessa Phi, MD    Family History Family History  Problem Relation Age of Onset  . Heart disease Mother   . Heart disease Father   . Heart disease Brother     Social History Social History  Substance Use Topics  . Smoking status: Never Smoker  . Smokeless tobacco: Never Used  . Alcohol use No     Allergies   Statins   Review of Systems Review of Systems  All other systems reviewed and are negative.    Physical Exam Updated Vital Signs BP 135/92   Pulse (!) 57   Temp 97.9 F (36.6 C)   Resp 18   SpO2 99%   Physical Exam  Constitutional: She is oriented to person, place, and time. She appears well-developed and well-nourished. No distress.  HENT:  Head: Normocephalic and atraumatic.  Right Ear: External ear normal.  Left Ear: External ear normal.  Nose: Nose normal.  Mouth/Throat: Oropharynx is clear and moist. No oropharyngeal exudate.  Eyes: Conjunctivae and EOM are normal. Pupils are equal, round, and reactive to light. Right eye exhibits no discharge. Left eye exhibits no discharge. No scleral icterus.  Neck: Normal range of motion. Neck supple. No JVD present. No tracheal deviation present.  Cardiovascular: Normal rate and regular rhythm.   Pulmonary/Chest: Effort normal and breath sounds normal. No stridor. No respiratory distress.  Abdominal: Soft. Bowel sounds are normal. She exhibits no distension and no mass. There is no tenderness. There is no guarding.  Musculoskeletal: Normal range of motion. She exhibits tenderness. She exhibits no edema.  ttp in left lumbar paraspinal  muscles, left buttock, left IT band.  No muscle spasms palpated or observed, no erythema.   No midline tenderness from cervical lumbar spine, no step-off.  Lymphadenopathy:    She has no cervical adenopathy.  Neurological: She is alert and oriented to person, place, and time. She exhibits normal muscle tone. Coordination normal.  Normal sensation to light touch in all extremities. Normal strength in bilateral lower extremities with dorsi flexion and plantar flexion. Able to lift leg off the exam table to 90 bilaterally  Skin: Skin is warm and dry. No rash noted. She is not diaphoretic. No erythema. No pallor.  Psychiatric: She has a normal mood and affect. Her behavior is normal. Judgment and thought content normal.  Nursing note and vitals reviewed.    ED Treatments / Results  Labs (all labs  ordered are listed, but only abnormal results are displayed) Labs Reviewed - No data to display  EKG  EKG Interpretation None       Radiology No results found.  Procedures Procedures (including critical care time)  Medications Ordered in ED Medications  cyclobenzaprine (FLEXERIL) tablet 10 mg (10 mg Oral Given 02/23/16 1216)  predniSONE (DELTASONE) tablet 60 mg (60 mg Oral Given 02/23/16 1216)  ketorolac (TORADOL) injection 30 mg (30 mg Intramuscular Given 02/23/16 1218)     Initial Impression / Assessment and Plan / ED Course  I have reviewed the triage vital signs and the nursing notes.  Pertinent labs & imaging results that were available during my care of the patient were reviewed by me and considered in my medical decision making (see chart for details).  Clinical Course  Pt with left low back pain with radiation to buttock and down left leg to knee.  Reproducible with palpation.  Consistent with low back strain with  Normal neurological exam, no evidence of urinary incontinence or retention, pain is consistently reproducible. There is no evidence of AAA or concern for dissection  at this time.   Patient can walk but states is painful.  No loss of bowel or bladder control.  No concern for cauda equina.  No fever, night sweats, weight loss, h/o cancer, IVDU.  Pain treated here in the department with adequate improvement. RICE protocol and pain medicine indicated and discussed with patient. I have also discussed reasons to return immediately to the ER.  Patient expresses understanding and agrees with plan.  Dr. Eudelia Bunchardama has seen and personally evaluated the pt and agrees with assessment and plan. Pt discharged home in good condition, VSS.    Final Clinical Impressions(s) / ED Diagnoses   Final diagnoses:  Left-sided low back pain with left-sided sciatica    New Prescriptions Discharge Medication List as of 02/23/2016 12:11 PM    START taking these medications   Details  cyclobenzaprine (FLEXERIL) 10 MG tablet Take 1 tablet (10 mg total) by mouth 2 (two) times daily as needed for muscle spasms., Starting Thu 02/23/2016, Print    predniSONE (DELTASONE) 20 MG tablet Take 2 tablets (40 mg total) by mouth daily., Starting Thu 02/23/2016, Print    traMADol (ULTRAM) 50 MG tablet Take 1 tablet (50 mg total) by mouth every 12 (twelve) hours as needed for severe pain., Starting Thu 02/23/2016, Print         Danelle BerryLeisa Jacoby Ritsema, PA-C 02/23/16 1611

## 2016-02-23 NOTE — ED Triage Notes (Signed)
Pt presents with 6 day h/o low back pain.  Pt denies any injury, reports pain begins to low back, radiates into L buttock and down the outer aspect of L thigh and into L groin.  Pt denies dysuria, pt reports pain worsens at night.

## 2016-03-08 ENCOUNTER — Inpatient Hospital Stay: Payer: Medicare Other | Admitting: Family Medicine

## 2016-03-10 ENCOUNTER — Encounter: Payer: Self-pay | Admitting: Family Medicine

## 2016-03-10 ENCOUNTER — Ambulatory Visit: Payer: Medicare Other | Attending: Family Medicine | Admitting: Family Medicine

## 2016-03-10 VITALS — BP 120/81 | HR 82 | Temp 97.7°F | Wt 181.2 lb

## 2016-03-10 DIAGNOSIS — I10 Essential (primary) hypertension: Secondary | ICD-10-CM | POA: Insufficient documentation

## 2016-03-10 DIAGNOSIS — Z79899 Other long term (current) drug therapy: Secondary | ICD-10-CM | POA: Insufficient documentation

## 2016-03-10 DIAGNOSIS — M5442 Lumbago with sciatica, left side: Secondary | ICD-10-CM | POA: Diagnosis not present

## 2016-03-10 DIAGNOSIS — Z7982 Long term (current) use of aspirin: Secondary | ICD-10-CM | POA: Insufficient documentation

## 2016-03-10 DIAGNOSIS — E785 Hyperlipidemia, unspecified: Secondary | ICD-10-CM | POA: Diagnosis not present

## 2016-03-10 DIAGNOSIS — I252 Old myocardial infarction: Secondary | ICD-10-CM

## 2016-03-10 DIAGNOSIS — M549 Dorsalgia, unspecified: Secondary | ICD-10-CM | POA: Diagnosis present

## 2016-03-10 MED ORDER — NITROGLYCERIN 0.4 MG SL SUBL: 0.4000 mg | SUBLINGUAL_TABLET | SUBLINGUAL | 3 refills | Status: DC | PRN

## 2016-03-10 NOTE — Assessment & Plan Note (Signed)
Improved Recommended home PT Continue conservative treatment

## 2016-03-10 NOTE — Patient Instructions (Addendum)
[name] was seen today for back pain.  Diagnoses and all orders for this visit:  History of MI (myocardial infarction) -     nitroGLYCERIN (NITROSTAT) 0.4 MG SL tablet; Place 1 tablet (0.4 mg total) under the tongue every 5 (five) minutes as needed for chest pain. Reported on 10/20/2015  Hyperlipidemia -     Lipid Panel; Future   F/u in one month lipid, lab visit and tetanus shot  F/u with me in 3 months for HTN   Dr. Armen Pickup   Sciatica With Rehab The sciatic nerve runs from the back down the leg and is responsible for sensation and control of the muscles in the back (posterior) side of the thigh, lower leg, and foot. Sciatica is a condition that is characterized by inflammation of this nerve.  SYMPTOMS   Signs of nerve damage, including numbness and/or weakness along the posterior side of the lower extremity.  Pain in the back of the thigh that may also travel down the leg.  Pain that worsens when sitting for long periods of time.  Occasionally, pain in the back or buttock. CAUSES  Inflammation of the sciatic nerve is the cause of sciatica. The inflammation is due to something irritating the nerve. Common sources of irritation include:  Sitting for long periods of time.  Direct trauma to the nerve.  Arthritis of the spine.  Herniated or ruptured disk.  Slipping of the vertebrae (spondylolisthesis).  Pressure from soft tissues, such as muscles or ligament-like tissue (fascia). RISK INCREASES WITH:  Sports that place pressure or stress on the spine (football or weightlifting).  Poor strength and flexibility.  Failure to warm up properly before activity.  Family history of low back pain or disk disorders.  Previous back injury or surgery.  Poor body mechanics, especially when lifting, or poor posture. PREVENTION   Warm up and stretch properly before activity.  Maintain physical fitness:  Strength, flexibility, and endurance.  Cardiovascular  fitness.  Learn and use proper technique, especially with posture and lifting. When possible, have coach correct improper technique.  Avoid activities that place stress on the spine. PROGNOSIS If treated properly, then sciatica usually resolves within 6 weeks. However, occasionally surgery is necessary.  RELATED COMPLICATIONS   Permanent nerve damage, including pain, numbness, tingle, or weakness.  Chronic back pain.  Risks of surgery: infection, bleeding, nerve damage, or damage to surrounding tissues. TREATMENT Treatment initially involves resting from any activities that aggravate your symptoms. The use of ice and medication may help reduce pain and inflammation. The use of strengthening and stretching exercises may help reduce pain with activity. These exercises may be performed at home or with referral to a therapist. A therapist may recommend further treatments, such as transcutaneous electronic nerve stimulation (TENS) or ultrasound. Your caregiver may recommend corticosteroid injections to help reduce inflammation of the sciatic nerve. If symptoms persist despite non-surgical (conservative) treatment, then surgery may be recommended. MEDICATION  If pain medication is necessary, then nonsteroidal anti-inflammatory medications, such as aspirin and ibuprofen, or other minor pain relievers, such as acetaminophen, are often recommended.  Do not take pain medication for 7 days before surgery.  Prescription pain relievers may be given if deemed necessary by your caregiver. Use only as directed and only as much as you need.  Ointments applied to the skin may be helpful.  Corticosteroid injections may be given by your caregiver. These injections should be reserved for the most serious cases, because they may only be given a certain number  of times. HEAT AND COLD  Cold treatment (icing) relieves pain and reduces inflammation. Cold treatment should be applied for 10 to 15 minutes every 2 to  3 hours for inflammation and pain and immediately after any activity that aggravates your symptoms. Use ice packs or massage the area with a piece of ice (ice massage).  Heat treatment may be used prior to performing the stretching and strengthening activities prescribed by your caregiver, physical therapist, or athletic trainer. Use a heat pack or soak the injury in warm water. SEEK MEDICAL CARE IF:  Treatment seems to offer no benefit, or the condition worsens.  Any medications produce adverse side effects. EXERCISES  RANGE OF MOTION (ROM) AND STRETCHING EXERCISES - Sciatica Most people with sciatic will find that their symptoms worsen with either excessive bending forward (flexion) or arching at the low back (extension). The exercises which will help resolve your symptoms will focus on the opposite motion. Your physician, physical therapist or athletic trainer will help you determine which exercises will be most helpful to resolve your low back pain. Do not complete any exercises without first consulting with your clinician. Discontinue any exercises which worsen your symptoms until you speak to your clinician. If you have pain, numbness or tingling which travels down into your buttocks, leg or foot, the goal of the therapy is for these symptoms to move closer to your back and eventually resolve. Occasionally, these leg symptoms will get better, but your low back pain may worsen; this is typically an indication of progress in your rehabilitation. Be certain to be very alert to any changes in your symptoms and the activities in which you participated in the 24 hours prior to the change. Sharing this information with your clinician will allow him/her to most efficiently treat your condition. These exercises may help you when beginning to rehabilitate your injury. Your symptoms may resolve with or without further involvement from your physician, physical therapist or athletic trainer. While completing  these exercises, remember:   Restoring tissue flexibility helps normal motion to return to the joints. This allows healthier, less painful movement and activity.  An effective stretch should be held for at least 30 seconds.  A stretch should never be painful. You should only feel a gentle lengthening or release in the stretched tissue. FLEXION RANGE OF MOTION AND STRETCHING EXERCISES: STRETCH - Flexion, Single Knee to Chest   Lie on a firm bed or floor with both legs extended in front of you.  Keeping one leg in contact with the floor, bring your opposite knee to your chest. Hold your leg in place by either grabbing behind your thigh or at your knee.  Pull until you feel a gentle stretch in your low back. Hold __________ seconds.  Slowly release your grasp and repeat the exercise with the opposite side. Repeat __________ times. Complete this exercise __________ times per day.  STRETCH - Flexion, Double Knee to Chest  Lie on a firm bed or floor with both legs extended in front of you.  Keeping one leg in contact with the floor, bring your opposite knee to your chest.  Tense your stomach muscles to support your back and then lift your other knee to your chest. Hold your legs in place by either grabbing behind your thighs or at your knees.  Pull both knees toward your chest until you feel a gentle stretch in your low back. Hold __________ seconds.  Tense your stomach muscles and slowly return one leg at a  time to the floor. Repeat __________ times. Complete this exercise __________ times per day.  STRETCH - Low Trunk Rotation   Lie on a firm bed or floor. Keeping your legs in front of you, bend your knees so they are both pointed toward the ceiling and your feet are flat on the floor.  Extend your arms out to the side. This will stabilize your upper body by keeping your shoulders in contact with the floor.  Gently and slowly drop both knees together to one side until you feel a  gentle stretch in your low back. Hold for __________ seconds.  Tense your stomach muscles to support your low back as you bring your knees back to the starting position. Repeat the exercise to the other side. Repeat __________ times. Complete this exercise __________ times per day  EXTENSION RANGE OF MOTION AND FLEXIBILITY EXERCISES: STRETCH - Extension, Prone on Elbows  Lie on your stomach on the floor, a bed will be too soft. Place your palms about shoulder width apart and at the height of your head.  Place your elbows under your shoulders. If this is too painful, stack pillows under your chest.  Allow your body to relax so that your hips drop lower and make contact more completely with the floor.  Hold this position for __________ seconds.  Slowly return to lying flat on the floor. Repeat __________ times. Complete this exercise __________ times per day.  RANGE OF MOTION - Extension, Prone Press Ups  Lie on your stomach on the floor, a bed will be too soft. Place your palms about shoulder width apart and at the height of your head.  Keeping your back as relaxed as possible, slowly straighten your elbows while keeping your hips on the floor. You may adjust the placement of your hands to maximize your comfort. As you gain motion, your hands will come more underneath your shoulders.  Hold this position __________ seconds.  Slowly return to lying flat on the floor. Repeat __________ times. Complete this exercise __________ times per day.  STRENGTHENING EXERCISES - Sciatica  These exercises may help you when beginning to rehabilitate your injury. These exercises should be done near your "sweet spot." This is the neutral, low-back arch, somewhere between fully rounded and fully arched, that is your least painful position. When performed in this safe range of motion, these exercises can be used for people who have either a flexion or extension based injury. These exercises may resolve your  symptoms with or without further involvement from your physician, physical therapist or athletic trainer. While completing these exercises, remember:   Muscles can gain both the endurance and the strength needed for everyday activities through controlled exercises.  Complete these exercises as instructed by your physician, physical therapist or athletic trainer. Progress with the resistance and repetition exercises only as your caregiver advises.  You may experience muscle soreness or fatigue, but the pain or discomfort you are trying to eliminate should never worsen during these exercises. If this pain does worsen, stop and make certain you are following the directions exactly. If the pain is still present after adjustments, discontinue the exercise until you can discuss the trouble with your clinician. STRENGTHENING - Deep Abdominals, Pelvic Tilt   Lie on a firm bed or floor. Keeping your legs in front of you, bend your knees so they are both pointed toward the ceiling and your feet are flat on the floor.  Tense your lower abdominal muscles to press your low back  into the floor. This motion will rotate your pelvis so that your tail bone is scooping upwards rather than pointing at your feet or into the floor.  With a gentle tension and even breathing, hold this position for __________ seconds. Repeat __________ times. Complete this exercise __________ times per day.  STRENGTHENING - Abdominals, Crunches   Lie on a firm bed or floor. Keeping your legs in front of you, bend your knees so they are both pointed toward the ceiling and your feet are flat on the floor. Cross your arms over your chest.  Slightly tip your chin down without bending your neck.  Tense your abdominals and slowly lift your trunk high enough to just clear your shoulder blades. Lifting higher can put excessive stress on the low back and does not further strengthen your abdominal muscles.  Control your return to the starting  position. Repeat __________ times. Complete this exercise __________ times per day.  STRENGTHENING - Quadruped, Opposite UE/LE Lift  Assume a hands and knees position on a firm surface. Keep your hands under your shoulders and your knees under your hips. You may place padding under your knees for comfort.  Find your neutral spine and gently tense your abdominal muscles so that you can maintain this position. Your shoulders and hips should form a rectangle that is parallel with the floor and is not twisted.  Keeping your trunk steady, lift your right hand no higher than your shoulder and then your left leg no higher than your hip. Make sure you are not holding your breath. Hold this position __________ seconds.  Continuing to keep your abdominal muscles tense and your back steady, slowly return to your starting position. Repeat with the opposite arm and leg. Repeat __________ times. Complete this exercise __________ times per day.  STRENGTHENING - Abdominals and Quadriceps, Straight Leg Raise   Lie on a firm bed or floor with both legs extended in front of you.  Keeping one leg in contact with the floor, bend the other knee so that your foot can rest flat on the floor.  Find your neutral spine, and tense your abdominal muscles to maintain your spinal position throughout the exercise.  Slowly lift your straight leg off the floor about 6 inches for a count of 15, making sure to not hold your breath.  Still keeping your neutral spine, slowly lower your leg all the way to the floor. Repeat this exercise with each leg __________ times. Complete this exercise __________ times per day. POSTURE AND BODY MECHANICS CONSIDERATIONS - Sciatica Keeping correct posture when sitting, standing or completing your activities will reduce the stress put on different body tissues, allowing injured tissues a chance to heal and limiting painful experiences. The following are general guidelines for improved posture.  Your physician or physical therapist will provide you with any instructions specific to your needs. While reading these guidelines, remember:  The exercises prescribed by your provider will help you have the flexibility and strength to maintain correct postures.  The correct posture provides the optimal environment for your joints to work. All of your joints have less wear and tear when properly supported by a spine with good posture. This means you will experience a healthier, less painful body.  Correct posture must be practiced with all of your activities, especially prolonged sitting and standing. Correct posture is as important when doing repetitive low-stress activities (typing) as it is when doing a single heavy-load activity (lifting). RESTING POSITIONS Consider which positions are most  painful for you when choosing a resting position. If you have pain with flexion-based activities (sitting, bending, stooping, squatting), choose a position that allows you to rest in a less flexed posture. You would want to avoid curling into a fetal position on your side. If your pain worsens with extension-based activities (prolonged standing, working overhead), avoid resting in an extended position such as sleeping on your stomach. Most people will find more comfort when they rest with their spine in a more neutral position, neither too rounded nor too arched. Lying on a non-sagging bed on your side with a pillow between your knees, or on your back with a pillow under your knees will often provide some relief. Keep in mind, being in any one position for a prolonged period of time, no matter how correct your posture, can still lead to stiffness. PROPER SITTING POSTURE In order to minimize stress and discomfort on your spine, you must sit with correct posture Sitting with good posture should be effortless for a healthy body. Returning to good posture is a gradual process. Many people can work toward this most  comfortably by using various supports until they have the flexibility and strength to maintain this posture on their own. When sitting with proper posture, your ears will fall over your shoulders and your shoulders will fall over your hips. You should use the back of the chair to support your upper back. Your low back will be in a neutral position, just slightly arched. You may place a small pillow or folded towel at the base of your low back for support.  When working at a desk, create an environment that supports good, upright posture. Without extra support, muscles fatigue and lead to excessive strain on joints and other tissues. Keep these recommendations in mind: CHAIR:   A chair should be able to slide under your desk when your back makes contact with the back of the chair. This allows you to work closely.  The chair's height should allow your eyes to be level with the upper part of your monitor and your hands to be slightly lower than your elbows. BODY POSITION  Your feet should make contact with the floor. If this is not possible, use a foot rest.  Keep your ears over your shoulders. This will reduce stress on your neck and low back. INCORRECT SITTING POSTURES   If you are feeling tired and unable to assume a healthy sitting posture, do not slouch or slump. This puts excessive strain on your back tissues, causing more damage and pain. Healthier options include:  Using more support, like a lumbar pillow.  Switching tasks to something that requires you to be upright or walking.  Talking a brief walk.  Lying down to rest in a neutral-spine position. PROLONGED STANDING WHILE SLIGHTLY LEANING FORWARD  When completing a task that requires you to lean forward while standing in one place for a long time, place either foot up on a stationary 2-4 inch high object to help maintain the best posture. When both feet are on the ground, the low back tends to lose its slight inward curve. If this  curve flattens (or becomes too large), then the back and your other joints will experience too much stress, fatigue more quickly and can cause pain.  CORRECT STANDING POSTURES Proper standing posture should be assumed with all daily activities, even if they only take a few moments, like when brushing your teeth. As in sitting, your ears should fall over your  shoulders and your shoulders should fall over your hips. You should keep a slight tension in your abdominal muscles to brace your spine. Your tailbone should point down to the ground, not behind your body, resulting in an over-extended swayback posture.  INCORRECT STANDING POSTURES  Common incorrect standing postures include a forward head, locked knees and/or an excessive swayback. WALKING Walk with an upright posture. Your ears, shoulders and hips should all line-up. PROLONGED ACTIVITY IN A FLEXED POSITION When completing a task that requires you to bend forward at your waist or lean over a low surface, try to find a way to stabilize 3 of 4 of your limbs. You can place a hand or elbow on your thigh or rest a knee on the surface you are reaching across. This will provide you more stability so that your muscles do not fatigue as quickly. By keeping your knees relaxed, or slightly bent, you will also reduce stress across your low back. CORRECT LIFTING TECHNIQUES DO :   Assume a wide stance. This will provide you more stability and the opportunity to get as close as possible to the object which you are lifting.  Tense your abdominals to brace your spine; then bend at the knees and hips. Keeping your back locked in a neutral-spine position, lift using your leg muscles. Lift with your legs, keeping your back straight.  Test the weight of unknown objects before attempting to lift them.  Try to keep your elbows locked down at your sides in order get the best strength from your shoulders when carrying an object.  Always ask for help when lifting  heavy or awkward objects. INCORRECT LIFTING TECHNIQUES DO NOT:   Lock your knees when lifting, even if it is a small object.  Bend and twist. Pivot at your feet or move your feet when needing to change directions.  Assume that you cannot safely pick up a paperclip without proper posture.   This information is not intended to replace advice given to you by your health care provider. Make sure you discuss any questions you have with your health care provider.   Document Released: 06/18/2005 Document Revised: 11/02/2014 Document Reviewed: 09/30/2008 [employer] Interactive Patient Education [employer]

## 2016-03-10 NOTE — Progress Notes (Signed)
Pt states she is using [facility] but pharmacy is not found in database.

## 2016-03-10 NOTE — Progress Notes (Signed)
Subjective:  Patient ID: [name], female    DOB: 01/17/1946  Age: 70 y.o. MRN: [id number]  CC: Back Pain   HPI Sierra SkeensSandra Divito has hx of HTN and MI she presents for   1. ED f/u low back pain: she went to ED on 02/23/2016 for L sided low back pain radiating down her L leg. She was diagnosed with sciatica. She was treated with steroid shot followed by oral prednisone. She also took tramadol and flexeril for about 3 days. She tolerated the treatment well other than increased appetite and some chest pain relieved with nitroglycerin. She denies CP now. She works out daily. She uses heat and voltaren gel for pain. She stretches. No weakness in leg. No fecal or urinary incontinence.   Social History  Substance Use Topics  . Smoking status: Never Smoker  . Smokeless tobacco: Never Used  . Alcohol use No    Outpatient Medications Prior to Visit  Medication Sig Dispense Refill  . amLODipine (NORVASC) 5 MG tablet Take 5 mg by mouth daily.    Marland Kitchen. aspirin 81 MG tablet Take 1 tablet (81 mg total) by mouth daily. 90 tablet 3  . calcium carbonate (OS-CAL) 1250 (500 Ca) MG chewable tablet Chew 1 tablet by mouth daily.    . cyclobenzaprine (FLEXERIL) 10 MG tablet Take 1 tablet (10 mg total) by mouth 2 (two) times daily as needed for muscle spasms. 20 tablet 0  . diclofenac sodium (VOLTAREN) 1 % GEL Apply 2 g topically 4 (four) times daily as needed (R bicep pain). 100 g 1  . enalapril (VASOTEC) 20 MG tablet Take 1 tablet (20 mg total) by mouth daily. 90 tablet 3  . hydrocortisone valerate ointment (WEST-CORT) 0.2 % Apply 1 application topically 2 (two) times daily as needed. Eyelid dermatitis 45 g 1  . KRILL OIL PO Take 1 capsule by mouth daily.    . meloxicam (MOBIC) 15 MG tablet Take 1 tablet (15 mg total) by mouth daily as needed for pain. 90 tablet 1  . metoprolol succinate (TOPROL-XL) 100 MG 24 hr tablet Take 1 tablet (100 mg total) by mouth daily. Take with or immediately following a meal. 90  tablet 3  . montelukast (SINGULAIR) 10 MG tablet Take 1 tablet (10 mg total) by mouth at bedtime. 90 tablet 3  . nitroGLYCERIN (NITROSTAT) 0.4 MG SL tablet Place 0.4 mg under the tongue every 5 (five) minutes as needed for chest pain. Reported on 10/20/2015    . NON FORMULARY qunol OTC daily    . pantoprazole (PROTONIX) 40 MG tablet Take 1 tablet (40 mg total) by mouth daily. 90 tablet 3  . predniSONE (DELTASONE) 20 MG tablet Take 2 tablets (40 mg total) by mouth daily. 10 tablet 0  . rosuvastatin (CRESTOR) 20 MG tablet Take 0.5 tablets (10 mg total) by mouth every other day. 30 tablet 3  . traMADol (ULTRAM) 50 MG tablet Take 1 tablet (50 mg total) by mouth every 12 (twelve) hours as needed for severe pain. 6 tablet 0  . traZODone (DESYREL) 50 MG tablet Take 0.5-1 tablets (25-50 mg total) by mouth at bedtime as needed for sleep. 90 tablet 3   No facility-administered medications prior to visit.     ROS Review of Systems  Constitutional: Negative for chills and fever.  Eyes: Negative for visual disturbance.  Respiratory: Negative for shortness of breath.   Cardiovascular: Negative for chest pain.  Gastrointestinal: Negative for abdominal pain and blood in stool.  Musculoskeletal: Positive for back pain. Negative for arthralgias.  Skin: Negative for rash.  Allergic/Immunologic: Negative for immunocompromised state.  Hematological: Negative for adenopathy. Does not bruise/bleed easily.  Psychiatric/Behavioral: Negative for dysphoric mood and suicidal ideas.    Objective:  BP 120/81 (BP Location: Right Arm, Patient Position: Sitting, Cuff Size: Small)   Pulse 82   Temp 97.7 F (36.5 C) (Oral)   Wt 181 lb 3.2 oz (82.2 kg)   SpO2 92%   BMI 31.10 kg/m   BP/Weight 03/10/2016 02/23/2016 10/20/2015  Systolic BP 120 135 117  Diastolic BP 81 92 72  Wt. (Lbs) 181.2 - 178  BMI 31.1 - 30.54   Physical Exam  Constitutional: She is oriented to person, place, and time. She appears well-developed  and well-nourished. No distress.  HENT:  Head: Normocephalic and atraumatic.  Cardiovascular: Normal rate, regular rhythm, normal heart sounds and intact distal pulses.   Pulmonary/Chest: Effort normal and breath sounds normal.  Musculoskeletal: She exhibits no edema.       Lumbar back: Normal. She exhibits normal range of motion, no tenderness, no bony tenderness, no swelling, no edema, no deformity, no laceration, no pain, no spasm and normal pulse.  Neurological: She is alert and oriented to person, place, and time.  Skin: Skin is warm and dry. No rash noted.  Psychiatric: She has a normal mood and affect.    Assessment & Plan:  Everlie was seen today for back pain.  Diagnoses and all orders for this visit:  History of MI (myocardial infarction) -     nitroGLYCERIN (NITROSTAT) 0.4 MG SL tablet; Place 1 tablet (0.4 mg total) under the tongue every 5 (five) minutes as needed for chest pain. Reported on 10/20/2015  Hyperlipidemia -     Lipid Panel; Future   There are no diagnoses linked to this encounter.  No orders of the defined types were placed in this encounter.   Follow-up: Return in about 4 weeks (around 04/07/2016) for labs and tetanus shot .   Dessa Phi MD

## 2016-03-26 ENCOUNTER — Ambulatory Visit (INDEPENDENT_AMBULATORY_CARE_PROVIDER_SITE_OTHER): Payer: Medicare Other

## 2016-03-26 ENCOUNTER — Ambulatory Visit (HOSPITAL_COMMUNITY)
Admission: EM | Admit: 2016-03-26 | Discharge: 2016-03-26 | Disposition: A | Discharge status: Home / Self Care | Payer: Medicare Other | Attending: Family Medicine | Admitting: Family Medicine

## 2016-03-26 ENCOUNTER — Encounter (HOSPITAL_COMMUNITY): Payer: Self-pay | Admitting: Emergency Medicine

## 2016-03-26 DIAGNOSIS — J189 Pneumonia, unspecified organism: Secondary | ICD-10-CM

## 2016-03-26 MED ORDER — ALBUTEROL SULFATE (2.5 MG/3ML) 0.083% IN NEBU
INHALATION_SOLUTION | RESPIRATORY_TRACT | Status: AC
  Filled 2016-03-26: qty 3

## 2016-03-26 MED ORDER — ALBUTEROL SULFATE (2.5 MG/3ML) 0.083% IN NEBU
2.5000 mg | INHALATION_SOLUTION | Freq: Once | RESPIRATORY_TRACT | Status: AC
  Administered 2016-03-26: 2.5 mg via RESPIRATORY_TRACT

## 2016-03-26 MED ORDER — ALBUTEROL SULFATE HFA 108 (90 BASE) MCG/ACT IN AERS: 1.0000 | INHALATION_SPRAY | Freq: Four times a day (QID) | RESPIRATORY_TRACT | 0 refills | Status: DC | PRN

## 2016-03-26 MED ORDER — AZITHROMYCIN 250 MG PO TABS: 250.0000 mg | ORAL_TABLET | Freq: Every day | ORAL | 0 refills | Status: DC

## 2016-03-26 NOTE — ED Provider Notes (Signed)
CSN: [id number]     Arrival date & time 03/26/16  1557 History   First MD Initiated Contact with Patient 03/26/16 1744     Chief Complaint  Patient presents with  . Cough   (Consider location/radiation/quality/duration/timing/severity/associated sxs/prior Treatment) HPI 70 year old female with cough, sputum, fever symptoms of been present for about one week. She has been using home "bush medicine" but it is not been helping she states that she does have sputum and has been somewhat discolored. No shortness of breath at home or with exertion. Past Medical History:  Diagnosis Date  . Hypertension Dx 1990  . Myocardial infarction [facility]) 2002   Past Surgical History:  Procedure Laterality Date  . CORONARY ARTERY BYPASS GRAFT  2002   Quadruple Bypass in [place]   Family History  Problem Relation Age of Onset  . Heart disease Mother   . Heart disease Father   . Heart disease Brother    Social History  Substance Use Topics  . Smoking status: Never Smoker  . Smokeless tobacco: Never Used  . Alcohol use No   OB History    No data available     Review of Systems  Denies: HEADACHE, NAUSEA, ABDOMINAL PAIN, CHEST PAIN, CONGESTION, DYSURIA, SHORTNESS OF BREATH  Allergies  Statins  Home Medications   Prior to Admission medications   Medication Sig Start Date End Date Taking? Authorizing Provider  amLODipine (NORVASC) 5 MG tablet Take 5 mg by mouth daily.   Yes Historical Provider, MD  enalapril (VASOTEC) 20 MG tablet Take 1 tablet (20 mg total) by mouth daily. 10/20/15  Yes Josalyn Funches, MD  meloxicam (MOBIC) 15 MG tablet Take 1 tablet (15 mg total) by mouth daily as needed for pain. 10/20/15  Yes Josalyn Funches, MD  metoprolol succinate (TOPROL-XL) 100 MG 24 hr tablet Take 1 tablet (100 mg total) by mouth daily. Take with or immediately following a meal. 10/20/15  Yes Josalyn Funches, MD  montelukast (SINGULAIR) 10 MG tablet Take 1 tablet (10 mg total) by mouth at bedtime.  10/20/15  Yes Dessa Phi, MD  aspirin 81 MG tablet Take 1 tablet (81 mg total) by mouth daily. 10/20/15   Josalyn Funches, MD  calcium carbonate (OS-CAL) 1250 (500 Ca) MG chewable tablet Chew 1 tablet by mouth daily.    Historical Provider, MD  cyclobenzaprine (FLEXERIL) 10 MG tablet Take 1 tablet (10 mg total) by mouth 2 (two) times daily as needed for muscle spasms. 02/23/16   Danelle Berry, PA-C  diclofenac sodium (VOLTAREN) 1 % GEL Apply 2 g topically 4 (four) times daily as needed (R bicep pain). 10/20/15   Josalyn Funches, MD  hydrocortisone valerate ointment (WEST-CORT) 0.2 % Apply 1 application topically 2 (two) times daily as needed. Eyelid dermatitis 10/20/15   Josalyn Funches, MD  KRILL OIL PO Take 1 capsule by mouth daily.    Historical Provider, MD  nitroGLYCERIN (NITROSTAT) 0.4 MG SL tablet Place 1 tablet (0.4 mg total) under the tongue every 5 (five) minutes as needed for chest pain. Reported on 10/20/2015 03/10/16   Dessa Phi, MD  NON FORMULARY qunol OTC daily    Historical Provider, MD  pantoprazole (PROTONIX) 40 MG tablet Take 1 tablet (40 mg total) by mouth daily. 10/20/15   Josalyn Funches, MD  rosuvastatin (CRESTOR) 20 MG tablet Take 0.5 tablets (10 mg total) by mouth every other day. 10/20/15   Josalyn Funches, MD  traMADol (ULTRAM) 50 MG tablet Take 1 tablet (50 mg total) by mouth  every 12 (twelve) hours as needed for severe pain. 02/23/16   Danelle BerryLeisa Tapia, PA-C  traZODone (DESYREL) 50 MG tablet Take 0.5-1 tablets (25-50 mg total) by mouth at bedtime as needed for sleep. 10/20/15   Dessa PhiJosalyn Funches, MD   Meds Ordered and Administered this Visit   Medications  albuterol (PROVENTIL) (2.5 MG/3ML) 0.083% nebulizer solution 2.5 mg (not administered)    BP 132/89 (BP Location: Right Arm)   Pulse 77   Temp 98.2 F (36.8 C) (Oral)   Resp 16   SpO2 96%  No data found.   Physical Exam NURSES NOTES AND VITAL SIGNS REVIEWED. CONSTITUTIONAL: Well developed, well nourished, no acute  distress HEENT: normocephalic, atraumatic EYES: Conjunctiva normal NECK:normal ROM, supple, no adenopathy PULMONARY:No respiratory distress, normal effort patient is speaking in full sentences, coarse breath sounds with a few crackles noted right posterior chest. ABDOMINAL: Soft, ND, NT BS+, No CVAT MUSCULOSKELETAL: Normal ROM of all extremities,  SKIN: warm and dry without rash PSYCHIATRIC: Mood and affect, behavior are normal  Urgent Care Course   Clinical Course  Albuterol nebulizer and chest x-ray.  Procedures (including critical care time)  Labs Review Labs Reviewed - No data to display  Imaging Review Dg Chest 2 View  Result Date: 03/26/2016 CLINICAL DATA:  Cough starting 6 days ago. EXAM: CHEST  2 VIEW COMPARISON:  None. FINDINGS: The heart size normal. Patient status post prior CABG and median sternotomy. The mediastinal contour is normal. There is mild diffuse increased pulmonary interstitium bilaterally. Degenerative joint changes of the spine are noted. IMPRESSION: Mild interstitial edema. Electronically Signed   By: Sherian ReinWei-Chen  Lin M.D.   On: 03/26/2016 18:20     Visual Acuity Review  Right Eye Distance:   Left Eye Distance:   Bilateral Distance:    Right Eye Near:   Left Eye Near:    Bilateral Near:         MDM   1. CAP (community acquired pneumonia)   Clinical   Patient is reassured that there are no issues that require transfer to higher level of care at this time or additional tests. Patient is advised to continue home symptomatic treatment. Patient is advised that if there are new or worsening symptoms to attend the emergency department, contact primary care provider, or return to UC. Instructions of care provided discharged home in stable condition.    THIS NOTE WAS GENERATED USING A VOICE RECOGNITION SOFTWARE PROGRAM. ALL REASONABLE EFFORTS  WERE MADE TO PROOFREAD THIS DOCUMENT FOR ACCURACY.  I have verbally reviewed the discharge instructions  with the patient. A printed AVS was given to the patient.  All questions were answered prior to discharge.      Tharon AquasFrank C Taziah Difatta, PA 03/26/16 [phone]

## 2016-03-26 NOTE — ED Triage Notes (Signed)
[facility] with a [facility] of a cough, chest congestion, sore throat and headache. The patient did not present with fever today and denied any fever at home > 100.4

## 2016-04-01 DIAGNOSIS — J189 Pneumonia, unspecified organism: Secondary | ICD-10-CM

## 2016-04-01 HISTORY — DX: Pneumonia, unspecified organism: J18.9

## 2016-04-19 ENCOUNTER — Observation Stay (HOSPITAL_COMMUNITY): Payer: Medicare Other

## 2016-04-19 ENCOUNTER — Observation Stay (HOSPITAL_COMMUNITY)
Admission: AD | Admit: 2016-04-19 | Discharge: 2016-04-21 | Disposition: A | Discharge status: Home / Self Care | Payer: Medicare Other | Source: Ambulatory Visit | Attending: Cardiovascular Disease | Admitting: Cardiovascular Disease

## 2016-04-19 ENCOUNTER — Encounter (HOSPITAL_COMMUNITY): Payer: Self-pay

## 2016-04-19 DIAGNOSIS — Z951 Presence of aortocoronary bypass graft: Secondary | ICD-10-CM | POA: Diagnosis not present

## 2016-04-19 DIAGNOSIS — K219 Gastro-esophageal reflux disease without esophagitis: Secondary | ICD-10-CM | POA: Diagnosis present

## 2016-04-19 DIAGNOSIS — I252 Old myocardial infarction: Secondary | ICD-10-CM | POA: Diagnosis not present

## 2016-04-19 DIAGNOSIS — E785 Hyperlipidemia, unspecified: Secondary | ICD-10-CM | POA: Diagnosis not present

## 2016-04-19 DIAGNOSIS — M2550 Pain in unspecified joint: Secondary | ICD-10-CM | POA: Diagnosis present

## 2016-04-19 DIAGNOSIS — Z7982 Long term (current) use of aspirin: Secondary | ICD-10-CM | POA: Diagnosis not present

## 2016-04-19 DIAGNOSIS — I10 Essential (primary) hypertension: Secondary | ICD-10-CM | POA: Diagnosis not present

## 2016-04-19 DIAGNOSIS — R072 Precordial pain: Secondary | ICD-10-CM | POA: Diagnosis present

## 2016-04-19 DIAGNOSIS — I25110 Atherosclerotic heart disease of native coronary artery with unstable angina pectoris: Principal | ICD-10-CM | POA: Insufficient documentation

## 2016-04-19 DIAGNOSIS — M5430 Sciatica, unspecified side: Secondary | ICD-10-CM

## 2016-04-19 DIAGNOSIS — R079 Chest pain, unspecified: Secondary | ICD-10-CM | POA: Diagnosis present

## 2016-04-19 HISTORY — DX: Low back pain, unspecified: M54.50

## 2016-04-19 HISTORY — DX: Atherosclerotic heart disease of native coronary artery without angina pectoris: I25.10

## 2016-04-19 HISTORY — DX: Sciatica, unspecified side: M54.30

## 2016-04-19 HISTORY — DX: Pneumonia, unspecified organism: J18.9

## 2016-04-19 HISTORY — DX: Low back pain: M54.5

## 2016-04-19 HISTORY — DX: Unspecified asthma, uncomplicated: J45.909

## 2016-04-19 HISTORY — DX: Other chronic pain: G89.29

## 2016-04-19 HISTORY — DX: Pure hypercholesterolemia, unspecified: E78.00

## 2016-04-19 LAB — CBC WITH DIFFERENTIAL/PLATELET
BASOS ABS: 0.1 10*3/uL (ref 0.0–0.1)
BASOS PCT: 1 %
Eosinophils Absolute: 0.7 10*3/uL (ref 0.0–0.7)
Eosinophils Relative: 8 %
HEMATOCRIT: 43.6 % (ref 36.0–46.0)
HEMOGLOBIN: 14.1 g/dL (ref 12.0–15.0)
Lymphocytes Relative: 34 %
Lymphs Abs: 2.9 10*3/uL (ref 0.7–4.0)
MCH: 28.3 pg (ref 26.0–34.0)
MCHC: 32.3 g/dL (ref 30.0–36.0)
MCV: 87.4 fL (ref 78.0–100.0)
Monocytes Absolute: 0.7 10*3/uL (ref 0.1–1.0)
Monocytes Relative: 8 %
NEUTROS ABS: 4.1 10*3/uL (ref 1.7–7.7)
NEUTROS PCT: 49 %
Platelets: 202 10*3/uL (ref 150–400)
RBC: 4.99 MIL/uL (ref 3.87–5.11)
RDW: 13.7 % (ref 11.5–15.5)
WBC: 8.4 10*3/uL (ref 4.0–10.5)

## 2016-04-19 LAB — TROPONIN I: TROPONIN I: 0.03 ng/mL — AB (ref <0.03)

## 2016-04-19 LAB — COMPREHENSIVE METABOLIC PANEL
ALT: 21 U/L (ref 14–54)
ANION GAP: 6 (ref 5–15)
AST: 23 U/L (ref 15–41)
Albumin: 4 g/dL (ref 3.5–5.0)
Alkaline Phosphatase: 68 U/L (ref 38–126)
BUN: 13 mg/dL (ref 6–20)
CHLORIDE: 102 mmol/L (ref 101–111)
CO2: 30 mmol/L (ref 22–32)
Calcium: 9.7 mg/dL (ref 8.9–10.3)
Creatinine, Ser: 0.66 mg/dL (ref 0.44–1.00)
Glucose, Bld: 116 mg/dL — ABNORMAL HIGH (ref 65–99)
POTASSIUM: 4.5 mmol/L (ref 3.5–5.1)
Sodium: 138 mmol/L (ref 135–145)
Total Bilirubin: 1 mg/dL (ref 0.3–1.2)
Total Protein: 7.4 g/dL (ref 6.5–8.1)

## 2016-04-19 MED ORDER — HEPARIN (PORCINE) IN NACL 100-0.45 UNIT/ML-% IJ SOLN
950.0000 [IU]/h | INTRAMUSCULAR | Status: DC
  Administered 2016-04-19: 950 [IU]/h via INTRAVENOUS
  Filled 2016-04-19: qty 250

## 2016-04-19 MED ORDER — TRAMADOL HCL 50 MG PO TABS: 50.0000 mg | ORAL_TABLET | Freq: Two times a day (BID) | ORAL | Status: DC | PRN

## 2016-04-19 MED ORDER — ALBUTEROL SULFATE (2.5 MG/3ML) 0.083% IN NEBU: 2.5000 mg | INHALATION_SOLUTION | Freq: Four times a day (QID) | RESPIRATORY_TRACT | Status: DC | PRN

## 2016-04-19 MED ORDER — PANTOPRAZOLE SODIUM 40 MG PO TBEC
40.0000 mg | DELAYED_RELEASE_TABLET | Freq: Every day | ORAL | Status: DC
  Administered 2016-04-20 – 2016-04-21 (×2): 40 mg via ORAL
  Filled 2016-04-19 (×2): qty 1

## 2016-04-19 MED ORDER — MONTELUKAST SODIUM 10 MG PO TABS
10.0000 mg | ORAL_TABLET | Freq: Every day | ORAL | Status: DC
  Administered 2016-04-19 – 2016-04-20 (×2): 10 mg via ORAL
  Filled 2016-04-19 (×2): qty 1

## 2016-04-19 MED ORDER — TRAZODONE HCL 50 MG PO TABS: 25.0000 mg | ORAL_TABLET | Freq: Every evening | ORAL | Status: DC | PRN

## 2016-04-19 MED ORDER — AMLODIPINE BESYLATE 5 MG PO TABS
5.0000 mg | ORAL_TABLET | Freq: Every day | ORAL | Status: DC
  Administered 2016-04-20 – 2016-04-21 (×2): 5 mg via ORAL
  Filled 2016-04-19 (×2): qty 1

## 2016-04-19 MED ORDER — SODIUM CHLORIDE 0.9% FLUSH: 3.0000 mL | INTRAVENOUS | Status: DC | PRN

## 2016-04-19 MED ORDER — ASPIRIN EC 81 MG PO TBEC
81.0000 mg | DELAYED_RELEASE_TABLET | Freq: Every day | ORAL | Status: DC
  Administered 2016-04-20 – 2016-04-21 (×2): 81 mg via ORAL
  Filled 2016-04-19 (×2): qty 1

## 2016-04-19 MED ORDER — DICLOFENAC SODIUM 1 % TD GEL
2.0000 g | Freq: Four times a day (QID) | TRANSDERMAL | Status: DC | PRN
  Filled 2016-04-19: qty 100

## 2016-04-19 MED ORDER — ONDANSETRON HCL 4 MG/2ML IJ SOLN: 4.0000 mg | Freq: Four times a day (QID) | INTRAMUSCULAR | Status: DC | PRN

## 2016-04-19 MED ORDER — ACETAMINOPHEN 325 MG PO TABS: 650.0000 mg | ORAL_TABLET | ORAL | Status: DC | PRN

## 2016-04-19 MED ORDER — HEPARIN BOLUS VIA INFUSION
4000.0000 [IU] | Freq: Once | INTRAVENOUS | Status: AC
  Administered 2016-04-19: 4000 [IU] via INTRAVENOUS
  Filled 2016-04-19: qty 4000

## 2016-04-19 MED ORDER — METOPROLOL SUCCINATE ER 100 MG PO TB24
100.0000 mg | ORAL_TABLET | Freq: Every day | ORAL | Status: DC
  Administered 2016-04-20 – 2016-04-21 (×2): 100 mg via ORAL
  Filled 2016-04-19 (×2): qty 1

## 2016-04-19 MED ORDER — SODIUM CHLORIDE 0.9 % IV SOLN: 250.0000 mL | INTRAVENOUS | Status: DC | PRN

## 2016-04-19 MED ORDER — CALCIUM CARBONATE 1250 (500 CA) MG PO TABS
1250.0000 mg | ORAL_TABLET | Freq: Every day | ORAL | Status: DC
  Administered 2016-04-20 – 2016-04-21 (×2): 1250 mg via ORAL
  Filled 2016-04-19 (×2): qty 1

## 2016-04-19 MED ORDER — ROSUVASTATIN CALCIUM 10 MG PO TABS
10.0000 mg | ORAL_TABLET | Freq: Every day | ORAL | Status: DC
  Administered 2016-04-19 – 2016-04-20 (×2): 10 mg via ORAL
  Filled 2016-04-19 (×2): qty 1

## 2016-04-19 MED ORDER — CYCLOBENZAPRINE HCL 10 MG PO TABS: 10.0000 mg | ORAL_TABLET | Freq: Two times a day (BID) | ORAL | Status: DC | PRN

## 2016-04-19 MED ORDER — NITROGLYCERIN 0.4 MG SL SUBL: 0.4000 mg | SUBLINGUAL_TABLET | SUBLINGUAL | Status: DC | PRN

## 2016-04-19 MED ORDER — ENALAPRIL MALEATE 10 MG PO TABS
20.0000 mg | ORAL_TABLET | Freq: Every day | ORAL | Status: DC
  Administered 2016-04-20 – 2016-04-21 (×2): 20 mg via ORAL
  Filled 2016-04-19 (×2): qty 2

## 2016-04-19 MED ORDER — SODIUM CHLORIDE 0.9% FLUSH
3.0000 mL | Freq: Two times a day (BID) | INTRAVENOUS | Status: DC
  Administered 2016-04-19 – 2016-04-20 (×4): 3 mL via INTRAVENOUS

## 2016-04-19 NOTE — H&P (Signed)
Referring Physician:  Lorne SkeensSandra Sanders is an 70 y.o. female.                       Chief Complaint: Chest pain.  HPI: 70 year old female with CABG 15 years ago has left sided chest pain, burning type, across the chest with weakness and occasionally sweating spell. No fever or cough.   Past Medical History:  Diagnosis Date  . Hypertension Dx 1990  . Myocardial infarction 2002      Past Surgical History:  Procedure Laterality Date  . CORONARY ARTERY BYPASS GRAFT  2002   [place]    Family History  Problem Relation Age of Onset  . Heart disease Mother   . Heart disease Father   . Heart disease Brother    Social History:  reports that she has never smoked. She has never used smokeless tobacco. She reports that she does not drink alcohol or use drugs.  Allergies:  Allergies  Allergen Reactions  . Statins Other (See Comments)    Muscle aches     Medications Prior to Admission  Medication Sig Dispense Refill  . albuterol (PROVENTIL HFA;VENTOLIN HFA) 108 (90 Base) MCG/ACT inhaler Inhale 1-2 puffs into the lungs every 6 (six) hours as needed for wheezing or shortness of breath. 1 Inhaler 0  . amLODipine (NORVASC) 5 MG tablet Take 5 mg by mouth daily.    Marland Kitchen. aspirin 81 MG tablet Take 1 tablet (81 mg total) by mouth daily. 90 tablet 3  . azithromycin (ZITHROMAX) 250 MG tablet Take 1 tablet (250 mg total) by mouth daily. Take first 2 tablets together, then 1 every day until finished. 6 tablet 0  . calcium carbonate (OS-CAL) 1250 (500 Ca) MG chewable tablet Chew 1 tablet by mouth daily.    . cyclobenzaprine (FLEXERIL) 10 MG tablet Take 1 tablet (10 mg total) by mouth 2 (two) times daily as needed for muscle spasms. 20 tablet 0  . diclofenac sodium (VOLTAREN) 1 % GEL Apply 2 g topically 4 (four) times daily as needed (R bicep pain). 100 g 1  . enalapril (VASOTEC) 20 MG tablet Take 1 tablet (20 mg total) by mouth daily. 90 tablet 3  . hydrocortisone valerate ointment  (WEST-CORT) 0.2 % Apply 1 application topically 2 (two) times daily as needed. Eyelid dermatitis 45 g 1  . KRILL OIL PO Take 1 capsule by mouth daily.    . meloxicam (MOBIC) 15 MG tablet Take 1 tablet (15 mg total) by mouth daily as needed for pain. 90 tablet 1  . metoprolol succinate (TOPROL-XL) 100 MG 24 hr tablet Take 1 tablet (100 mg total) by mouth daily. Take with or immediately following a meal. 90 tablet 3  . montelukast (SINGULAIR) 10 MG tablet Take 1 tablet (10 mg total) by mouth at bedtime. 90 tablet 3  . nitroGLYCERIN (NITROSTAT) 0.4 MG SL tablet Place 1 tablet (0.4 mg total) under the tongue every 5 (five) minutes as needed for chest pain. Reported on 10/20/2015 20 tablet 3  . NON FORMULARY qunol OTC daily    . pantoprazole (PROTONIX) 40 MG tablet Take 1 tablet (40 mg total) by mouth daily. 90 tablet 3  . rosuvastatin (CRESTOR) 20 MG tablet Take 0.5 tablets (10 mg total) by mouth every other day. 30 tablet 3  . traMADol (ULTRAM) 50 MG tablet Take 1 tablet (50 mg total) by mouth every 12 (twelve) hours as needed for severe pain. 6 tablet  0  . traZODone (DESYREL) 50 MG tablet Take 0.5-1 tablets (25-50 mg total) by mouth at bedtime as needed for sleep. 90 tablet 3    No results found for this or any previous visit (from the past 48 hour(s)). No results found.  Review Of Systems Constitutional: No fever or chills.  Eyes: Wears glasses. No glaucoma. ENT: No hearing loss. No tinnitus. Respiratory: Positive asthma. No COPD. Cardiovascular: Occasional chest pain and shortness of breath. Abdomen: No nausea, vomiting or diarrhea. No hepatitis or gi bleed. GU: No hematuria or kidney stone. Neurological: Occasional headaches and dizziness. No stroke or seizures. Psych: No admission for anxiety or depression or suicidal thoughts. Skin: No rash. Musculoskeletal: Positive arthritis.  Blood pressure (!) 159/93, pulse 89, temperature 97.9 F (36.6 C), temperature source Oral, height 5\' 6"   (1.676 m), weight 82.6 kg (182 lb), SpO2 97 %. Body mass index is 29.38 kg/m. General appearance: alert, cooperative, appears stated age and mild distress Head: Normocephalic, atraumatic Eyes: Pink conjunctivae. Corneas clear. PERRL, EOM's intact. Sclerae- non-icteric. Neck: No adenopathy, no carotid bruit, no JVD, supple, symmetrical, trachea midline and thyroid not enlarged, Resp: Clear to auscultation bilaterally. Mid-line scar. Cardio: Regular rate and rhythm, S1, S2 normal, no murmur, click, rub or gallop GI: soft, non-tender; bowel sounds normal; no masses,  no organomegaly Extremities: extremities normal, no cyanosis or edema Skin: Warm and dry. No rashes or lesions Neurologic: Alert and oriented X 3, normal strength and tone. Normal coordination and gait  Assessment/Plan Chest pain r/o MI Hypertension CAD CABG  Place in observation. Lab work and home medications. Nuclear stress test in AM.  Ricki Rodriguez, MD  04/19/2016, 3:18 PM

## 2016-04-19 NOTE — Progress Notes (Signed)
ANTICOAGULATION CONSULT NOTE - Initial Consult  Pharmacy Consult for: Heparin Indication: chest pain/ACS  Allergies  Allergen Reactions  . Statins Other (See Comments)    Muscle aches     Patient Measurements: Height: 5\' 6"  (167.6 cm) Weight: 182 lb (82.6 kg) IBW/kg (Calculated) : 59.3 Heparin Dosing Weight: 77 kg  Vital Signs: Temp: 97.9 F (36.6 C) (10/19 1424) Temp Source: Oral (10/19 1424) BP: 159/93 (10/19 1424) Pulse Rate: 89 (10/19 1424)  Labs: No results for input(s): HGB, HCT, PLT, APTT, LABPROT, INR, HEPARINUNFRC, HEPRLOWMOCWT, CREATININE, CKTOTAL, CKMB, TROPONINI in the last 72 hours.  CrCl cannot be calculated (Patient's most recent lab result is older than the maximum 21 days allowed.).   Medical History: Past Medical History:  Diagnosis Date  . Hypertension Dx 1990  . Myocardial infarction 2002  . Sciatica 04/19/2016    Assessment: [age] YOF with hx of CAD s/p CABG 15 years ago, presented with chest pain. Pharmacy is consulted to start IV heparin. Plan for stress test tomorrow. Goal of Therapy:   Heparin level 0.3-0.7 units/ml Monitor platelets by anticoagulation protocol: Yes   Plan:  Heparin bolus 4000 units Heparin infusion 950 units/hr F/u 8 heparin level at midnight Daily heparin level and CBC  Bayard HuggerMei Kary Colaizzi, PharmD, BCPS  Clinical Pharmacist  Pager: [phone]   04/19/2016,3:49 PM

## 2016-04-20 ENCOUNTER — Encounter (HOSPITAL_COMMUNITY): Payer: Self-pay | Admitting: General Practice

## 2016-04-20 ENCOUNTER — Encounter (HOSPITAL_COMMUNITY)
Admission: AD | Disposition: A | Discharge status: Home / Self Care | Payer: Self-pay | Source: Ambulatory Visit | Attending: Cardiovascular Disease

## 2016-04-20 ENCOUNTER — Observation Stay (HOSPITAL_COMMUNITY): Payer: Medicare Other

## 2016-04-20 DIAGNOSIS — I10 Essential (primary) hypertension: Secondary | ICD-10-CM | POA: Diagnosis not present

## 2016-04-20 DIAGNOSIS — I252 Old myocardial infarction: Secondary | ICD-10-CM | POA: Diagnosis not present

## 2016-04-20 DIAGNOSIS — I25110 Atherosclerotic heart disease of native coronary artery with unstable angina pectoris: Secondary | ICD-10-CM | POA: Diagnosis not present

## 2016-04-20 DIAGNOSIS — Z951 Presence of aortocoronary bypass graft: Secondary | ICD-10-CM | POA: Diagnosis not present

## 2016-04-20 HISTORY — PX: CARDIAC CATHETERIZATION: SHX172

## 2016-04-20 LAB — BASIC METABOLIC PANEL
ANION GAP: 7 (ref 5–15)
BUN: 15 mg/dL (ref 6–20)
CALCIUM: 9.1 mg/dL (ref 8.9–10.3)
CO2: 29 mmol/L (ref 22–32)
Chloride: 103 mmol/L (ref 101–111)
Creatinine, Ser: 0.71 mg/dL (ref 0.44–1.00)
GLUCOSE: 121 mg/dL — AB (ref 65–99)
POTASSIUM: 4.6 mmol/L (ref 3.5–5.1)
Sodium: 139 mmol/L (ref 135–145)

## 2016-04-20 LAB — CBC
HEMATOCRIT: 41.5 % (ref 36.0–46.0)
Hemoglobin: 13.5 g/dL (ref 12.0–15.0)
MCH: 28.6 pg (ref 26.0–34.0)
MCHC: 32.5 g/dL (ref 30.0–36.0)
MCV: 87.9 fL (ref 78.0–100.0)
PLATELETS: 190 10*3/uL (ref 150–400)
RBC: 4.72 MIL/uL (ref 3.87–5.11)
RDW: 13.8 % (ref 11.5–15.5)
WBC: 8.6 10*3/uL (ref 4.0–10.5)

## 2016-04-20 LAB — PROTIME-INR
INR: 1.07
PROTHROMBIN TIME: 14 s (ref 11.4–15.2)

## 2016-04-20 LAB — HEPARIN LEVEL (UNFRACTIONATED)
HEPARIN UNFRACTIONATED: 0.43 [IU]/mL (ref 0.30–0.70)
Heparin Unfractionated: 0.44 IU/mL (ref 0.30–0.70)

## 2016-04-20 LAB — LIPID PANEL
CHOL/HDL RATIO: 4.2 ratio
Cholesterol: 205 mg/dL — ABNORMAL HIGH (ref 0–200)
HDL: 49 mg/dL (ref >40)
LDL Cholesterol: 129 mg/dL — ABNORMAL HIGH (ref 0–99)
TRIGLYCERIDES: 135 mg/dL (ref <150)
VLDL: 27 mg/dL (ref 0–40)

## 2016-04-20 LAB — TROPONIN I

## 2016-04-20 SURGERY — LEFT HEART CATH AND CORS/GRAFTS ANGIOGRAPHY
Anesthesia: LOCAL

## 2016-04-20 MED ORDER — SODIUM CHLORIDE 0.9 % IV SOLN: INTRAVENOUS | Status: AC

## 2016-04-20 MED ORDER — NITROGLYCERIN 1 MG/10 ML FOR IR/CATH LAB
INTRA_ARTERIAL | Status: AC
  Filled 2016-04-20: qty 10

## 2016-04-20 MED ORDER — FENTANYL CITRATE (PF) 100 MCG/2ML IJ SOLN
INTRAMUSCULAR | Status: AC
  Filled 2016-04-20: qty 2

## 2016-04-20 MED ORDER — SODIUM CHLORIDE 0.9% FLUSH: 3.0000 mL | INTRAVENOUS | Status: DC | PRN

## 2016-04-20 MED ORDER — MIDAZOLAM HCL 2 MG/2ML IJ SOLN
INTRAMUSCULAR | Status: AC
  Filled 2016-04-20: qty 2

## 2016-04-20 MED ORDER — LIDOCAINE HCL (PF) 1 % IJ SOLN
INTRAMUSCULAR | Status: AC
  Filled 2016-04-20: qty 30

## 2016-04-20 MED ORDER — TECHNETIUM TC 99M TETROFOSMIN IV KIT
10.0000 | PACK | Freq: Once | INTRAVENOUS | Status: AC | PRN
  Administered 2016-04-20: 10 via INTRAVENOUS

## 2016-04-20 MED ORDER — REGADENOSON 0.4 MG/5ML IV SOLN
INTRAVENOUS | Status: AC
  Filled 2016-04-20: qty 5

## 2016-04-20 MED ORDER — IOPAMIDOL (ISOVUE-370) INJECTION 76%
INTRAVENOUS | Status: DC | PRN
  Administered 2016-04-20: 75 mL via INTRA_ARTERIAL

## 2016-04-20 MED ORDER — REGADENOSON 0.4 MG/5ML IV SOLN
0.4000 mg | Freq: Once | INTRAVENOUS | Status: AC
  Administered 2016-04-20: 0.4 mg via INTRAVENOUS
  Filled 2016-04-20: qty 5

## 2016-04-20 MED ORDER — MIDAZOLAM HCL 2 MG/2ML IJ SOLN
INTRAMUSCULAR | Status: DC | PRN
  Administered 2016-04-20: 1 mg via INTRAVENOUS

## 2016-04-20 MED ORDER — SODIUM CHLORIDE 0.9 % IV SOLN
INTRAVENOUS | Status: DC
  Administered 2016-04-20: 15:00:00 via INTRAVENOUS

## 2016-04-20 MED ORDER — HEPARIN (PORCINE) IN NACL 2-0.9 UNIT/ML-% IJ SOLN
INTRAMUSCULAR | Status: DC | PRN
  Administered 2016-04-20: 1000 mL

## 2016-04-20 MED ORDER — SODIUM CHLORIDE 0.9% FLUSH: 3.0000 mL | Freq: Two times a day (BID) | INTRAVENOUS | Status: DC

## 2016-04-20 MED ORDER — SODIUM CHLORIDE 0.9 % IV SOLN: 250.0000 mL | INTRAVENOUS | Status: DC | PRN

## 2016-04-20 MED ORDER — HEPARIN (PORCINE) IN NACL 2-0.9 UNIT/ML-% IJ SOLN
INTRAMUSCULAR | Status: AC
Start: 2016-04-20 — End: 2016-04-20
  Filled 2016-04-20: qty 1000

## 2016-04-20 MED ORDER — TECHNETIUM TC 99M TETROFOSMIN IV KIT
30.0000 | PACK | Freq: Once | INTRAVENOUS | Status: AC | PRN
  Administered 2016-04-20: 30 via INTRAVENOUS

## 2016-04-20 MED ORDER — FENTANYL CITRATE (PF) 100 MCG/2ML IJ SOLN
INTRAMUSCULAR | Status: DC | PRN
  Administered 2016-04-20 (×2): 25 ug via INTRAVENOUS

## 2016-04-20 MED ORDER — LIDOCAINE HCL (PF) 1 % IJ SOLN
INTRAMUSCULAR | Status: DC | PRN
  Administered 2016-04-20: 20 mL via SUBCUTANEOUS

## 2016-04-20 MED ORDER — SODIUM CHLORIDE 0.9 % IV SOLN
250.0000 mL | INTRAVENOUS | Status: DC | PRN
Start: 2016-04-20 — End: 2016-04-21

## 2016-04-20 SURGICAL SUPPLY — 10 items
CATH INFINITI 5FR MULTPACK ANG (CATHETERS) ×2 IMPLANT
KIT ENCORE 26 ADVANTAGE (KITS) IMPLANT
KIT HEART LEFT (KITS) ×2 IMPLANT
PACK CARDIAC CATHETERIZATION (CUSTOM PROCEDURE TRAY) ×2 IMPLANT
SHEATH PINNACLE 5F 10CM (SHEATH) ×2 IMPLANT
SHEATH PINNACLE 6F 10CM (SHEATH) IMPLANT
SYR MEDRAD MARK V 150ML (SYRINGE) ×2 IMPLANT
TRANSDUCER W/STOPCOCK (MISCELLANEOUS) ×2 IMPLANT
TUBING CIL FLEX 10 FLL-RA (TUBING) IMPLANT
WIRE EMERALD 3MM-J .035X150CM (WIRE) ×2 IMPLANT

## 2016-04-20 NOTE — Interval H&P Note (Signed)
History and Physical Interval Note:  04/20/2016 2:46 PM  [name]  has presented today for surgery, with the diagnosis of cp  The various methods of treatment have been discussed with the patient and family. After consideration of risks, benefits and other options for treatment, the patient has consented to  Procedure(s): Left Heart Cath and Cors/Grafts Angiography (N/A) as a surgical intervention .  The patient's history has been reviewed, patient examined, no change in status, stable for surgery.  I have reviewed the patient's chart and labs.  Questions were answered to the patient's satisfaction.     [name] S

## 2016-04-20 NOTE — Progress Notes (Signed)
Patient sitting up in bed, no needs at this time. Call light within reach.  

## 2016-04-20 NOTE — Care Management Obs Status (Signed)
MEDICARE OBSERVATION STATUS NOTIFICATION   Patient Details  Name: [name] MRN: [id number] Date of Birth: Jun 22, 1946   Medicare Observation Status Notification Given:  Yes    Darrold SpanWebster, Rody Keadle Hall, RN 04/20/2016, 4:47 PM

## 2016-04-20 NOTE — Progress Notes (Signed)
ANTICOAGULATION CONSULT NOTE - Initial Consult  Pharmacy Consult for: Heparin Indication: chest pain/ACS  Allergies  Allergen Reactions  . Statins Other (See Comments)    Muscle aches     Patient Measurements: Height: 5\' 6"  (167.6 cm) Weight: 179 lb 4.8 oz (81.3 kg) IBW/kg (Calculated) : 59.3 Heparin Dosing Weight: 77 kg  Vital Signs: Temp: 98.1 F (36.7 C) (10/20 0535) Temp Source: Oral (10/20 0535) BP: 164/96 (10/20 0909) Pulse Rate: 65 (10/20 0535)  Labs:  Recent Labs  04/19/16 1559 04/19/16 2028 04/20/16 0013 04/20/16 0313  HGB 14.1  --   --  13.5  HCT 43.6  --   --  41.5  PLT 202  --   --  190  HEPARINUNFRC  --   --  0.43 0.44  CREATININE 0.66  --   --  0.71  TROPONINI 0.03* <0.03  --  <0.03    Estimated Creatinine Clearance: 70.3 mL/min (by C-G formula based on SCr of 0.71 mg/dL).  Assessment: [age] YOF with hx of CAD s/p CABG 15 years ago, presented with chest pain  PMH: HTN, CAD  Anticoag: heparin for ACS, stress test in AM. HL 0.44 on 950 units/hr  Nephro: SCr 0.71  Heme/Onc: H&H 13.5/41.5, Plt 190  Goal of Therapy:  Heparin level 0.3-0.7 units/ml Monitor platelets by anticoagulation protocol: Yes   Plan:  Heparin infusion 950 units/hr Daily heparin level and CBC F/U plans s/p stress test  Isaac BlissMichael Irja Wheless, PharmD, BCPS, [facility] Clinical Pharmacist Pager [phone] 04/20/2016 10:23 AM

## 2016-04-20 NOTE — Progress Notes (Signed)
Site area: rt groin fa sheath Site Prior to Removal:  Level 0 Pressure Applied For:  20 minutes Manual:   yes Patient Status During Pull:  stable Post Pull Site:  Level  0 Post Pull Instructions Given:  yes Post Pull Pulses Present: yes Dressing Applied:  tegaderm Bedrest begins @ 1620 Comments:

## 2016-04-20 NOTE — Progress Notes (Signed)
ANTICOAGULATION CONSULT NOTE - follow-up Consult  Pharmacy Consult for: Heparin Indication: chest pain/ACS  Allergies  Allergen Reactions  . Statins Other (See Comments)    Muscle aches     Patient Measurements: Height: 5\' 6"  (167.6 cm) Weight: 182 lb (82.6 kg) IBW/kg (Calculated) : 59.3 Heparin Dosing Weight: 77 kg  Vital Signs: Temp: 98 F (36.7 C) (10/19 1952) Temp Source: Oral (10/19 1952) BP: 132/80 (10/19 1952) Pulse Rate: 68 (10/19 1952)  Labs:  Recent Labs  04/19/16 1559 04/19/16 2028 04/20/16 0013  HGB 14.1  --   --   HCT 43.6  --   --   PLT 202  --   --   HEPARINUNFRC  --   --  0.43  CREATININE 0.66  --   --   TROPONINI 0.03* <0.03  --     Estimated Creatinine Clearance: Sierra.9 mL/min (by C-G formula based on SCr of 0.66 mg/dL).   Assessment: [name] on heparin for r/o ACS. Heparin level therapeutic (0.43) on gtt at 950 units/hr. No bleeding noted. Plan for stress test tomorrow.  Goal of Therapy:  Heparin level 0.3-0.7 units/ml Monitor platelets by anticoagulation protocol: Yes   Plan:  Continue heparin infusion 950 units/hr Will f/u daily heparin level to confirm therapeutic  Christoper Fabianaron Jennafer Gladue, PharmD, BCPS Clinical pharmacist, pager [phone] 04/20/2016,1:38 AM

## 2016-04-21 DIAGNOSIS — I25110 Atherosclerotic heart disease of native coronary artery with unstable angina pectoris: Secondary | ICD-10-CM | POA: Diagnosis not present

## 2016-04-21 LAB — BASIC METABOLIC PANEL
ANION GAP: 5 (ref 5–15)
BUN: 12 mg/dL (ref 6–20)
CO2: 29 mmol/L (ref 22–32)
Calcium: 9.1 mg/dL (ref 8.9–10.3)
Chloride: 104 mmol/L (ref 101–111)
Creatinine, Ser: 0.73 mg/dL (ref 0.44–1.00)
GFR calc non Af Amer: >60 mL/min (ref >60)
Glucose, Bld: 125 mg/dL — ABNORMAL HIGH (ref 65–99)
Potassium: 4.6 mmol/L (ref 3.5–5.1)
SODIUM: 138 mmol/L (ref 135–145)

## 2016-04-21 LAB — CBC
HEMATOCRIT: 41.5 % (ref 36.0–46.0)
HEMOGLOBIN: 13.6 g/dL (ref 12.0–15.0)
MCH: 28.6 pg (ref 26.0–34.0)
MCHC: 32.8 g/dL (ref 30.0–36.0)
MCV: 87.2 fL (ref 78.0–100.0)
Platelets: 187 10*3/uL (ref 150–400)
RBC: 4.76 MIL/uL (ref 3.87–5.11)
RDW: 13.8 % (ref 11.5–15.5)
WBC: 6.5 10*3/uL (ref 4.0–10.5)

## 2016-04-21 LAB — HEPARIN LEVEL (UNFRACTIONATED)

## 2016-04-21 MED ORDER — ISOSORBIDE MONONITRATE ER 30 MG PO TB24: 30.0000 mg | ORAL_TABLET | Freq: Every day | ORAL | 3 refills | Status: DC

## 2016-04-21 MED ORDER — ISOSORBIDE MONONITRATE ER 30 MG PO TB24
30.0000 mg | ORAL_TABLET | Freq: Every day | ORAL | Status: DC
  Administered 2016-04-21: 30 mg via ORAL
  Filled 2016-04-21: qty 1

## 2016-04-21 MED ORDER — ROSUVASTATIN CALCIUM 20 MG PO TABS: 20.0000 mg | ORAL_TABLET | ORAL | 3 refills | Status: DC

## 2016-04-21 MED ORDER — CLOPIDOGREL BISULFATE 75 MG PO TABS: 75.0000 mg | ORAL_TABLET | Freq: Every day | ORAL | 3 refills | Status: AC

## 2016-04-21 NOTE — Discharge Summary (Signed)
NAMDartha Sanders:  [name]             ACCOUNT NO.:  [id number]  MEDICAL RECORD NO.:  [id number]  LOCATION:  [id number]                        FACILITY:  [facility]  PHYSICIAN:  Yolonda Purtle N. Sharyn LullHarwani, M.D. DATE OF BIRTH:  1945-08-16  DATE OF ADMISSION:  04/19/2016 DATE OF DISCHARGE:  04/21/2016                              DISCHARGE SUMMARY   ADMITTING DIAGNOSES:  Chest pain rule out myocardial infarction, coronary artery disease status post coronary artery bypass graft, history of myocardial infarction in the past, hypertension, hyperlipidemia.  DISCHARGE DIAGNOSES: 1. Stable angina, myocardial infarction ruled out.  Positive nuclear     stress test status post left cardiac catheterization. 2. Multivessel coronary artery disease status post coronary artery     bypass graft with occluded saphenous vein graft to right coronary     artery and critical multiple sequential lesions in the saphenous     vein graft to obtuse marginal.  Patent left internal mammary     artery, hypertension, hyperlipidemia, history of myocardial     infarction in the past.  DISCHARGE HOME MEDICATIONS: 1. Clopidogrel 75 mg 1 tablet daily. 2. Isosorbide mononitrate 30 mg 1 tablet daily. 3. Albuterol inhaler 1-2 puffs every 6 hours as before. 4. Amlodipine 5 mg 1 tablet daily. 5. Aspirin 81 mg 1 tablet daily. 6. Calcium carbonate 1 tablet daily as before. 7. Flexeril 10 mg twice daily as before as needed. 8. Diclofenac sodium gel as before as needed. 9. Enalapril 20 mg 1 tablet daily. 10.Hydrocortisone ointment 0.2% locally as before. 11.__________ oil 1 capsule daily as before. 12.Metoprolol succinate 100 mg 1 tablet daily as before. 13.Singulair 10 mg 1 tablet daily. 14.Nitrostat 0.4 mg sublingual use as directed. 15.Protonix 40 mg daily. 16.Tramadol 50 mg every 12 hours as needed for severe pain. 17.Trazodone 25-50 mg daily at bedtime as before. 18.Crestor has been increased to 20 mg daily. 19.The patient advised to  stop meloxicam.  DIET:  Low salt, low cholesterol.  ACTIVITY:  Increase activity slowly as tolerated.  CONDITION AT DISCHARGE:  Stable.  The patient was discussed at length regarding the cath finding and if she notices any change in her anginal symptoms, should call 911 immediately.  Discussed various options of treatment, i.e., medical versus CABG versus very high risk PCI, wanted to be treated medically and absolutely refusing for evaluation for CABG.  BRIEF HISTORY AND HOSPITAL COURSE:  [name] is a 70 year old female with past medical history significant for coronary artery disease, history of MI in the past status [place], hypertension, hyperlipidemia, was admitted by Dr. Algie CofferKadakia because of left-sided chest pain, burning in nature across the chest with weakness and occasional diaphoresis.  The patient denies any fever or chills or cough.  PHYSICAL EXAMINATION:  GENERAL:  She was alert, awake, oriented x3. VITAL SIGNS:  Blood pressure was 159/93, pulse 89, she was afebrile. HEENT:  Conjunctivae were pink. NECK:  Supple.  No JVD.  No bruit. LUNGS:  Clear to auscultation without rhonchi or rales. CARDIOVASCULAR:  S1, S2 was normal.  There was no murmur, gallop, or rub. ABDOMEN:  Soft.  Bowel sounds were present.  Nontender. EXTREMITIES:  There is no  clubbing, cyanosis, or edema.  LABORATORY DATA:  Her labs, sodium was 138, potassium 4.5, BUN 13, creatinine 0.66.  Glucose was minimally elevated 116.  Three sets of troponin-I were negative.  Cholesterol was 205, triglycerides 135, LDL 129.  Hemoglobin was 14.1, hematocrit 43.6.  The patient had nuclear scan which showed moderate reversibility involving the anterior wall including anteroseptal and anterolateral wall.  Findings were suggestive of ischemia with EF of 52%.  BRIEF HOSPITAL COURSE:  The patient was admitted to telemetry unit.  MI was ruled out by serial enzymes and  EKG.  The patient subsequently underwent nuclear stress test as above requiring cardiac catheterization.  The patient was noted to have critical sequential stenosis and sequential saphenous vein graft to obtuse marginal with the graft that appears to be degenerated, 70 years old.  The patient opted for medical management.  The patient did not have any further episodes of anginal chest pain during the hospital stay.  Her groin is stable with no evidence of hematoma or bruit.  The patient will be discharged home on above medications and will be followed up by Dr. Algie Coffer early next week.     [name], M.D.     [facility]  D:  04/21/2016  T:  04/21/2016  Job:  [id number]

## 2016-04-21 NOTE — Discharge Summary (Signed)
Discharge summary dictated on 04/21/2016 dictation number is [id number]

## 2016-04-21 NOTE — Discharge Instructions (Signed)
Coronary Angiogram °A coronary angiogram, also called coronary angiography, is an X-ray procedure used to look at the arteries in the heart. In this procedure, a dye (contrast dye) is injected through a long, hollow tube (catheter). The catheter is about the size of a piece of cooked spaghetti and is inserted through your groin, wrist, or arm. The dye is injected into each artery, and X-rays are then taken to show if there is a blockage in the arteries of your heart. °LET YOUR HEALTH CARE PROVIDER KNOW ABOUT: °· Any allergies you have, including allergies to shellfish or contrast dye.   °· All medicines you are taking, including vitamins, herbs, eye drops, creams, and over-the-counter medicines.   °· Previous problems you or members of your family have had with the use of anesthetics.   °· Any blood disorders you have.   °· Previous surgeries you have had. °· History of kidney problems or failure.   °· Other medical conditions you have. °RISKS AND COMPLICATIONS  °Generally, a coronary angiogram is a safe procedure. However, problems can occur and include: °· Allergic reaction to the dye. °· Bleeding from the access site or other locations. °· Kidney injury, especially in people with impaired kidney function.  °· Stroke (rare). °· Heart attack (rare). °BEFORE THE PROCEDURE  °· Do not eat or drink anything after midnight the night before the procedure or as directed by your health care provider.   °· Ask your health care provider about changing or stopping your regular medicines. This is especially important if you are taking diabetes medicines or blood thinners. °PROCEDURE °· You may be given a medicine to help you relax (sedative) before the procedure. This medicine is given through an intravenous (IV) access tube that is inserted into one of your veins.   °· The area where the catheter will be inserted will be washed and shaved. This is usually done in the groin but may be done in the fold of your arm (near your  elbow) or in the wrist.    °· A medicine will be given to numb the area where the catheter will be inserted (local anesthetic).   °· The health care provider will insert the catheter into an artery. The catheter will be guided by using a special type of X-ray (fluoroscopy) of the blood vessel being examined.   °· A special dye will then be injected into the catheter, and X-rays will be taken. The dye will help to show where any narrowing or blockages are located in the heart arteries.   °AFTER THE PROCEDURE  °· If the procedure is done through the leg, you will be kept in bed lying flat for several hours. You will be instructed to not bend or cross your legs. °· The insertion site will be checked frequently.   °· The pulse in your feet or wrist will be checked frequently.   °· Additional blood tests, X-rays, and an electrocardiogram may be done.   °  °This information is not intended to replace advice given to you by your health care provider. Make sure you discuss any questions you have with your health care provider. °  °Document Released: 12/23/2002 Document Revised: 07/09/2014 Document Reviewed: 11/10/2012 °Elsevier Interactive Patient Education ©2016 [employer] °Acute Coronary Syndrome °Acute coronary syndrome (ACS) is a serious problem in which there is suddenly not enough blood and oxygen supplied to the heart. ACS may mean that one or more of the blood vessels in your heart (coronary arteries) may be blocked. ACS can result in chest pain or a heart attack (myocardial infarction   or MI). °CAUSES °This condition is caused by atherosclerosis, which is the buildup of fat and cholesterol (plaque) on the inside of the arteries. Over time, the plaque may narrow or block the artery, and this will lessen blood flow to the heart. Plaque can also become weak and break off within a coronary artery to form a clot and cause a sudden blockage. °RISK FACTORS °The risks factors of this condition include: °· High  cholesterol levels. °· High blood pressure (hypertension). °· Smoking. °· Diabetes. °· Age. °· Family history of chest pain, heart disease, or stroke. °· Lack of exercise. °SYMPTOMS °The most common signs of this condition include: °· Chest pain, which can be: °· A crushing or squeezing in the chest. °· A tightness, pressure, fullness, or heaviness in the chest. °· Present for more than a few minutes, or it can stop and recur. °· Pain in the arms, neck, jaw, or back. °· Unexplained heartburn or indigestion. °· Shortness of breath. °· Nausea. °· Sudden cold sweats. °· Feeling light-headed or dizzy. °Sometimes, this condition has no symptoms. °DIAGNOSIS °ACS may be diagnosed through the following tests: °· Electrocardiogram (ECG). °· Blood tests. °· Coronary angiogram. This is a procedure to look at the coronary arteries to see if there is any blockage. °TREATMENT °Treatment for ACS may include: °· Healthy behavioral changes to reduce or control risk factors. °· Medicine. °· Coronary stenting. A stent helps to keep an artery open. °· Coronary angioplasty. This procedure widens a narrowed or blocked artery. °· Coronary artery bypass surgery. This will allow your blood to pass the blockage (bypass) to reach your heart. °HOME CARE INSTRUCTIONS °Eating and Drinking °· Follow a heart-healthy diet. A dietitian can you help to educate you about healthy food options and changes. °· Use healthy cooking methods such as roasting, grilling, broiling, baking, poaching, steaming, or stir-frying. Talk to a dietitian to learn more about healthy cooking methods. °Medicines °· Take medicines only as directed by your health care provider. °· Do not take the following medicines unless your health care provider approves: °¨ Nonsteroidal anti-inflammatory drugs (NSAIDs), such as ibuprofen, naproxen, or celecoxib. °¨ Vitamin supplements that contain vitamin A, vitamin E, or both. °¨ Hormone replacement therapy that contains estrogen with or  without progestin. °· Stop illegal drug use. °Activities °· Follow an exercise program that is approved by your health care provider. °· Plan rest periods when you are fatigued. °Lifestyle °· Do not use any tobacco products, including cigarettes, chewing tobacco, or electronic cigarettes. If you need help quitting, ask your health care provider. °· If you drink alcohol, and your health care provider approves, limit your alcohol intake to no more than 1 drink per day. One drink equals 12 ounces of beer, 5 ounces of wine, or 1½ ounces of hard liquor. °· Learn to manage stress. °· Maintain a healthy weight. Lose weight as approved by your health care provider. °General Instructions °· Manage other health conditions, such as hypertension and diabetes, as directed by your health care provider. °· Keep all follow-up visits as directed by your health care provider. This is important. °· Your health care provider may ask you to monitor your blood pressure. A blood pressure reading consists of a higher number over a lower number, such as 110 over 72, written as 110/72. Ideally, your blood pressure should be: °¨ Below 140/90 if you have no other medical conditions. °¨ Below 130/80 if you have diabetes or kidney disease. °SEEK IMMEDIATE MEDICAL CARE IF: °· You   have pain in your chest, neck, arm, jaw, stomach, or back that lasts more than a few minutes, is recurring, or is not relieved by taking medicine under your tongue (sublingual nitroglycerin). °· You have profuse sweating without cause. °· You have unexplained: °¨ Heartburn or indigestion. °¨ Shortness of breath or difficulty breathing. °¨ Nausea or vomiting. °¨ Fatigue. °¨ Feelings of nervousness or anxiety. °¨ Weakness. °¨ Diarrhea. °· You have sudden light-headedness or dizziness. °· You faint. °These symptoms may represent a serious problem that is an emergency. Do not wait to see if the symptoms will go away. Get medical help right away. Call your local emergency  services (911 in the [place] Do not drive yourself to the clinic or hospital. °  °This information is not intended to replace advice given to you by your health care provider. Make sure you discuss any questions you have with your health care provider. °  °Document Released: 06/18/2005 Document Revised: 07/09/2014 Document Reviewed: 10/20/2013 °Elsevier Interactive Patient Education ©2016 Elsevier Inc. ° °

## 2016-04-23 ENCOUNTER — Encounter (HOSPITAL_COMMUNITY): Payer: Self-pay | Admitting: Cardiovascular Disease

## 2016-04-23 MED FILL — Nitroglycerin IV Soln 100 MCG/ML in D5W: INTRA_ARTERIAL | Qty: 10 | Status: AC

## 2016-04-27 ENCOUNTER — Encounter: Payer: Self-pay | Admitting: Family Medicine

## 2016-04-27 ENCOUNTER — Ambulatory Visit: Payer: Medicare Other | Attending: Family Medicine | Admitting: Family Medicine

## 2016-04-27 VITALS — BP 107/72 | HR 76 | Temp 97.7°F | Ht 64.0 in | Wt 180.2 lb

## 2016-04-27 DIAGNOSIS — Z951 Presence of aortocoronary bypass graft: Secondary | ICD-10-CM | POA: Insufficient documentation

## 2016-04-27 DIAGNOSIS — Z7902 Long term (current) use of antithrombotics/antiplatelets: Secondary | ICD-10-CM | POA: Diagnosis not present

## 2016-04-27 DIAGNOSIS — J189 Pneumonia, unspecified organism: Secondary | ICD-10-CM | POA: Insufficient documentation

## 2016-04-27 DIAGNOSIS — I252 Old myocardial infarction: Secondary | ICD-10-CM | POA: Insufficient documentation

## 2016-04-27 DIAGNOSIS — Z79899 Other long term (current) drug therapy: Secondary | ICD-10-CM | POA: Diagnosis not present

## 2016-04-27 DIAGNOSIS — R079 Chest pain, unspecified: Secondary | ICD-10-CM | POA: Diagnosis not present

## 2016-04-27 DIAGNOSIS — I2510 Atherosclerotic heart disease of native coronary artery without angina pectoris: Secondary | ICD-10-CM | POA: Insufficient documentation

## 2016-04-27 DIAGNOSIS — L409 Psoriasis, unspecified: Secondary | ICD-10-CM | POA: Diagnosis not present

## 2016-04-27 DIAGNOSIS — I10 Essential (primary) hypertension: Secondary | ICD-10-CM | POA: Diagnosis not present

## 2016-04-27 DIAGNOSIS — Z23 Encounter for immunization: Secondary | ICD-10-CM

## 2016-04-27 DIAGNOSIS — Z7982 Long term (current) use of aspirin: Secondary | ICD-10-CM | POA: Insufficient documentation

## 2016-04-27 DIAGNOSIS — E7800 Pure hypercholesterolemia, unspecified: Secondary | ICD-10-CM | POA: Insufficient documentation

## 2016-04-27 MED ORDER — FUROSEMIDE 20 MG PO TABS: 20.0000 mg | ORAL_TABLET | Freq: Every day | ORAL | 0 refills | Status: DC

## 2016-04-27 MED ORDER — CLOBETASOL PROPIONATE 0.05 % EX FOAM: Freq: Two times a day (BID) | CUTANEOUS | 0 refills | Status: DC

## 2016-04-27 MED ORDER — ROSUVASTATIN CALCIUM 20 MG PO TABS: 20.0000 mg | ORAL_TABLET | Freq: Every day | ORAL | 3 refills | Status: DC

## 2016-04-27 NOTE — Patient Instructions (Addendum)
Sierra DavenportSandra was seen today for hospitalization follow-up and follow-up.  Diagnoses and all orders for this visit:  Essential hypertension -     furosemide (LASIX) 20 MG tablet; Take 1 tablet (20 mg total) by mouth daily.  History of MI (myocardial infarction) -     rosuvastatin (CRESTOR) 20 MG tablet; Take 1 tablet (20 mg total) by mouth daily. -     furosemide (LASIX) 20 MG tablet; Take 1 tablet (20 mg total) by mouth daily.  Pure hypercholesterolemia -     rosuvastatin (CRESTOR) 20 MG tablet; Take 1 tablet (20 mg total) by mouth daily.  Psoriasis of scalp -     clobetasol (OLUX) 0.05 % topical foam; Apply topically 2 (two) times daily. To scalp for two weeks    F/u in 4 weeks for HTN and fluid check   Dr. Armen PickupFunches

## 2016-04-27 NOTE — Assessment & Plan Note (Signed)
Adding clobetasol foam BID for 2 weeks

## 2016-04-27 NOTE — Addendum Note (Signed)
Addended by: Ronette DeterFARRINGTON, Loula Marcella V on: 04/27/2016 03:43 PM   Modules accepted: Orders

## 2016-04-27 NOTE — Progress Notes (Signed)
Pt is here for hospital follow up.  Pt needs refills on all medications.(no tramadol)

## 2016-04-27 NOTE — Progress Notes (Signed)
Subjective:  Patient ID: [name], female    DOB: 1945-08-31  Age: 70 y.o. MRN: [id number]  CC: Hospitalization Follow-up (chest pain, CAD) and Follow-up (pneumonia )   HPI [name]  Has CAD hx  MI and CABG, HTN, HLD she  Presents with her partner for    1.  ED f/u pneumonia: she was seen in [facility] ED on 03/26/16 for cough. CXR was obtained and revealed mild interstitial edema.   2. She was admitted to [facility] on 04/19/16 by her cardiologist for chest pain, weakness and sweating. She had a NM myocardial scan on 04/20/2016 that revealed ischemia. She had cardiac catheterization that revealed mild left ventricular systolic dysfunction (Ef 45-50%) and multiple areas of chronic total occlusion and severe graft disease. She had been intolerant of statins (crestor and lipitor) but is now taking crestor 20 mg every other daily.  She denies CP. She has fatigue, shortness of breath with exertion and cough. She has follow up her her cardiologist next week.   Social History  Substance Use Topics  . Smoking status: Never Smoker  . Smokeless tobacco: Never Used  . Alcohol use No    Outpatient Medications Prior to Visit  Medication Sig Dispense Refill  . albuterol (PROVENTIL HFA;VENTOLIN HFA) 108 (90 Base) MCG/ACT inhaler Inhale 1-2 puffs into the lungs every 6 (six) hours as needed for wheezing or shortness of breath. 1 Inhaler 0  . amLODipine (NORVASC) 5 MG tablet Take 5 mg by mouth daily.    Marland Kitchen aspirin 81 MG tablet Take 1 tablet (81 mg total) by mouth daily. 90 tablet 3  . calcium carbonate (OS-CAL) 1250 (500 Ca) MG chewable tablet Chew 1 tablet by mouth daily.    . clopidogrel (PLAVIX) 75 MG tablet Take 1 tablet (75 mg total) by mouth daily. 30 tablet 3  . cyclobenzaprine (FLEXERIL) 10 MG tablet Take 1 tablet (10 mg total) by mouth 2 (two) times daily as needed for muscle spasms. 20 tablet 0  . diclofenac sodium (VOLTAREN) 1 % GEL Apply 2 g topically 4 (four) times daily as  needed (R bicep pain). 100 g 1  . enalapril (VASOTEC) 20 MG tablet Take 1 tablet (20 mg total) by mouth daily. 90 tablet 3  . hydrocortisone valerate ointment (WEST-CORT) 0.2 % Apply 1 application topically 2 (two) times daily as needed. Eyelid dermatitis 45 g 1  . isosorbide mononitrate (IMDUR) 30 MG 24 hr tablet Take 1 tablet (30 mg total) by mouth daily. 30 tablet 3  . KRILL OIL PO Take 1 capsule by mouth daily.    . metoprolol succinate (TOPROL-XL) 100 MG 24 hr tablet Take 1 tablet (100 mg total) by mouth daily. Take with or immediately following a meal. 90 tablet 3  . montelukast (SINGULAIR) 10 MG tablet Take 1 tablet (10 mg total) by mouth at bedtime. 90 tablet 3  . nitroGLYCERIN (NITROSTAT) 0.4 MG SL tablet Place 1 tablet (0.4 mg total) under the tongue every 5 (five) minutes as needed for chest pain. Reported on 10/20/2015 20 tablet 3  . pantoprazole (PROTONIX) 40 MG tablet Take 1 tablet (40 mg total) by mouth daily. 90 tablet 3  . rosuvastatin (CRESTOR) 20 MG tablet Take 1 tablet (20 mg total) by mouth every other day. 30 tablet 3  . traMADol (ULTRAM) 50 MG tablet Take 1 tablet (50 mg total) by mouth every 12 (twelve) hours as needed for severe pain. 6 tablet 0  . traZODone (DESYREL) 50 MG tablet  Take 0.5-1 tablets (25-50 mg total) by mouth at bedtime as needed for sleep. 90 tablet 3   No facility-administered medications prior to visit.     ROS Review of Systems  Constitutional: Negative for chills and fever.  Eyes: Negative for visual disturbance.  Respiratory: Positive for shortness of breath.   Cardiovascular: Negative for chest pain.  Gastrointestinal: Negative for abdominal pain and blood in stool.  Musculoskeletal: Positive for back pain. Negative for arthralgias.  Skin: Positive for rash (scalp ).  Allergic/Immunologic: Negative for immunocompromised state.  Hematological: Negative for adenopathy. Does not bruise/bleed easily.  Psychiatric/Behavioral: Negative for dysphoric  mood and suicidal ideas.    Objective:  BP 107/72 (BP Location: Left Arm, Patient Position: Sitting, Cuff Size: Small)   Pulse 76   Temp 97.7 F (36.5 C) (Oral)   Ht 5\' 4"  (1.626 m)   Wt 180 lb 3.2 oz (81.7 kg)   SpO2 93%   BMI 30.93 kg/m   BP/Weight 04/27/2016 04/21/2016 03/26/2016  Systolic BP 107 117 132  Diastolic BP 72 73 89  Wt. (Lbs) 180.2 178.8 -  BMI 30.93 28.86 -   Wt Readings from Last 3 Encounters:  04/27/16 180 lb 3.2 oz (81.7 kg)  04/21/16 178 lb 12.8 oz (81.1 kg)  03/10/16 181 lb 3.2 oz (82.2 kg)    Physical Exam  Constitutional: She is oriented to person, place, and time. She appears well-developed and well-nourished. No distress.  HENT:  Head: Normocephalic and atraumatic.    Cardiovascular: Normal rate, regular rhythm, normal heart sounds and intact distal pulses.   Pulmonary/Chest: Effort normal and breath sounds normal.  Musculoskeletal: She exhibits edema (trace in b/l ankles ).       Lumbar back: Normal. She exhibits normal range of motion, no tenderness, no bony tenderness, no swelling, no edema, no deformity, no laceration, no pain, no spasm and normal pulse.  Neurological: She is alert and oriented to person, place, and time.  Skin: Skin is warm and dry. No rash noted.  Psychiatric: She has a normal mood and affect.    Assessment & Plan:  Sierra DavenportSandra was seen today for hospitalization follow-up and follow-up.  Diagnoses and all orders for this visit:  Essential hypertension -     furosemide (LASIX) 20 MG tablet; Take 1 tablet (20 mg total) by mouth daily.  History of MI (myocardial infarction) -     rosuvastatin (CRESTOR) 20 MG tablet; Take 1 tablet (20 mg total) by mouth daily. -     furosemide (LASIX) 20 MG tablet; Take 1 tablet (20 mg total) by mouth daily.  Pure hypercholesterolemia -     rosuvastatin (CRESTOR) 20 MG tablet; Take 1 tablet (20 mg total) by mouth daily.  Psoriasis of scalp -     clobetasol (OLUX) 0.05 % topical foam; Apply  topically 2 (two) times daily. To scalp for two weeks   There are no diagnoses linked to this encounter.  No orders of the defined types were placed in this encounter.   Follow-up: Return in about 3 weeks (around 05/18/2016) for HTN and fluid check .   Dessa PhiJosalyn Daytona Hedman MD

## 2016-04-27 NOTE — Assessment & Plan Note (Signed)
A; patient with extensive CAD on medical management P: continue current regimen with the increase of crestor to daily Adding low dose lasix for fluid control

## 2016-05-17 ENCOUNTER — Encounter: Payer: Self-pay | Admitting: Family Medicine

## 2016-05-17 ENCOUNTER — Ambulatory Visit: Payer: Medicare Other | Attending: Family Medicine | Admitting: Family Medicine

## 2016-05-17 VITALS — BP 107/72 | HR 77 | Temp 97.4°F | Ht 64.0 in | Wt 180.8 lb

## 2016-05-17 DIAGNOSIS — K219 Gastro-esophageal reflux disease without esophagitis: Secondary | ICD-10-CM | POA: Diagnosis not present

## 2016-05-17 DIAGNOSIS — I2510 Atherosclerotic heart disease of native coronary artery without angina pectoris: Secondary | ICD-10-CM | POA: Insufficient documentation

## 2016-05-17 DIAGNOSIS — M79601 Pain in right arm: Secondary | ICD-10-CM

## 2016-05-17 DIAGNOSIS — Z7902 Long term (current) use of antithrombotics/antiplatelets: Secondary | ICD-10-CM | POA: Insufficient documentation

## 2016-05-17 DIAGNOSIS — Z23 Encounter for immunization: Secondary | ICD-10-CM

## 2016-05-17 DIAGNOSIS — Z7982 Long term (current) use of aspirin: Secondary | ICD-10-CM | POA: Diagnosis not present

## 2016-05-17 DIAGNOSIS — I10 Essential (primary) hypertension: Secondary | ICD-10-CM | POA: Diagnosis not present

## 2016-05-17 DIAGNOSIS — Z79899 Other long term (current) drug therapy: Secondary | ICD-10-CM | POA: Diagnosis not present

## 2016-05-17 MED ORDER — METOPROLOL SUCCINATE ER 100 MG PO TB24: 100.0000 mg | ORAL_TABLET | Freq: Every day | ORAL | 3 refills | Status: DC

## 2016-05-17 MED ORDER — PANTOPRAZOLE SODIUM 40 MG PO TBEC: 40.0000 mg | DELAYED_RELEASE_TABLET | Freq: Every day | ORAL | 3 refills | Status: DC

## 2016-05-17 MED ORDER — TRAMADOL HCL 50 MG PO TABS: 50.0000 mg | ORAL_TABLET | Freq: Two times a day (BID) | ORAL | 2 refills | Status: DC | PRN

## 2016-05-17 MED ORDER — AMLODIPINE BESYLATE 5 MG PO TABS: 5.0000 mg | ORAL_TABLET | Freq: Every day | ORAL | 3 refills | Status: DC

## 2016-05-17 MED ORDER — FUROSEMIDE 20 MG PO TABS: 20.0000 mg | ORAL_TABLET | Freq: Every day | ORAL | 3 refills | Status: DC

## 2016-05-17 MED ORDER — DICLOFENAC SODIUM 1 % TD GEL: 2.0000 g | Freq: Four times a day (QID) | TRANSDERMAL | 5 refills | Status: DC | PRN

## 2016-05-17 NOTE — Assessment & Plan Note (Signed)
Controlled with no fluid overload Continue current regimen

## 2016-05-17 NOTE — Patient Instructions (Addendum)
[name] was seen today for hypertension.  Diagnoses and all orders for this visit:  Essential hypertension -     furosemide (LASIX) 20 MG tablet; Take 1 tablet (20 mg total) by mouth daily. -     metoprolol succinate (TOPROL-XL) 100 MG 24 hr tablet; Take 1 tablet (100 mg total) by mouth daily. Take with or immediately following a meal. -     amLODipine (NORVASC) 5 MG tablet; Take 1 tablet (5 mg total) by mouth daily.  Right arm pain -     diclofenac sodium (VOLTAREN) 1 % GEL; Apply 2 g topically 4 (four) times daily as needed (R bicep pain). -     traMADol (ULTRAM) 50 MG tablet; Take 1 tablet (50 mg total) by mouth every 12 (twelve) hours as needed for severe pain.  Gastroesophageal reflux disease, esophagitis presence not specified -     pantoprazole (PROTONIX) 40 MG tablet; Take 1 tablet (40 mg total) by mouth daily.  Atherosclerosis of native coronary artery of native heart without angina pectoris -     furosemide (LASIX) 20 MG tablet; Take 1 tablet (20 mg total) by mouth daily. -     metoprolol succinate (TOPROL-XL) 100 MG 24 hr tablet; Take 1 tablet (100 mg total) by mouth daily. Take with or immediately following a meal.    F/u in 3 months for HTN  Dr. Armen PickupFunches

## 2016-05-17 NOTE — Progress Notes (Signed)
Subjective:  Patient ID: [name], female    DOB: 27-Apr-1946  Age: 70 y.o. MRN: [id number]  CC: Hypertension   HPI Sierra SkeensSandra Sanders presents for    1. CHRONIC HYPERTENSION in setting of CAD  Disease Monitoring  Blood pressure range: not checking   Chest pain: yes, at night when laying down, burning    Dyspnea: yes, but improving from recent hospitalization    Claudication: no   Medication compliance: yes  Medication Side Effects  Lightheadedness: no   Urinary frequency: no   Edema: no, improved with lasix    Impotence:    2. CAD: taking statin and plavix. Reports that her insurance will only cover Crestor until the end of January, 2018.   Social History  Substance Use Topics  . Smoking status: Never Smoker  . Smokeless tobacco: Never Used  . Alcohol use No   Outpatient Medications Prior to Visit  Medication Sig Dispense Refill  . albuterol (PROVENTIL HFA;VENTOLIN HFA) 108 (90 Base) MCG/ACT inhaler Inhale 1-2 puffs into the lungs every 6 (six) hours as needed for wheezing or shortness of breath. 1 Inhaler 0  . amLODipine (NORVASC) 5 MG tablet Take 5 mg by mouth daily.    Marland Kitchen. aspirin 81 MG tablet Take 1 tablet (81 mg total) by mouth daily. 90 tablet 3  . calcium carbonate (OS-CAL) 1250 (500 Ca) MG chewable tablet Chew 1 tablet by mouth daily.    . clobetasol (OLUX) 0.05 % topical foam Apply topically 2 (two) times daily. To scalp for two weeks 50 g 0  . clopidogrel (PLAVIX) 75 MG tablet Take 1 tablet (75 mg total) by mouth daily. 30 tablet 3  . cyclobenzaprine (FLEXERIL) 10 MG tablet Take 1 tablet (10 mg total) by mouth 2 (two) times daily as needed for muscle spasms. 20 tablet 0  . diclofenac sodium (VOLTAREN) 1 % GEL Apply 2 g topically 4 (four) times daily as needed (R bicep pain). 100 g 1  . enalapril (VASOTEC) 20 MG tablet Take 1 tablet (20 mg total) by mouth daily. 90 tablet 3  . esomeprazole (NEXIUM) 20 MG packet Take 20 mg by mouth daily before breakfast.    .  furosemide (LASIX) 20 MG tablet Take 1 tablet (20 mg total) by mouth daily. 30 tablet 0  . hydrocortisone valerate ointment (WEST-CORT) 0.2 % Apply 1 application topically 2 (two) times daily as needed. Eyelid dermatitis 45 g 1  . isosorbide mononitrate (IMDUR) 30 MG 24 hr tablet Take 1 tablet (30 mg total) by mouth daily. 30 tablet 3  . KRILL OIL PO Take 1 capsule by mouth daily.    . metoprolol succinate (TOPROL-XL) 100 MG 24 hr tablet Take 1 tablet (100 mg total) by mouth daily. Take with or immediately following a meal. 90 tablet 3  . montelukast (SINGULAIR) 10 MG tablet Take 1 tablet (10 mg total) by mouth at bedtime. 90 tablet 3  . nitroGLYCERIN (NITROSTAT) 0.4 MG SL tablet Place 1 tablet (0.4 mg total) under the tongue every 5 (five) minutes as needed for chest pain. Reported on 10/20/2015 20 tablet 3  . rosuvastatin (CRESTOR) 20 MG tablet Take 1 tablet (20 mg total) by mouth daily. 30 tablet 3  . traMADol (ULTRAM) 50 MG tablet Take 1 tablet (50 mg total) by mouth every 12 (twelve) hours as needed for severe pain. 6 tablet 0   No facility-administered medications prior to visit.     ROS Review of Systems  Constitutional: Negative for chills  and fever.  Eyes: Negative for visual disturbance.  Respiratory: Positive for shortness of breath.   Cardiovascular: Positive for chest pain.  Gastrointestinal: Negative for abdominal pain and blood in stool.  Musculoskeletal: Negative for arthralgias and back pain.  Allergic/Immunologic: Negative for immunocompromised state.  Hematological: Negative for adenopathy. Does not bruise/bleed easily.  Psychiatric/Behavioral: Negative for dysphoric mood and suicidal ideas.    Objective:  BP 107/72 (BP Location: Left Arm, Patient Position: Sitting, Cuff Size: Small)   Pulse 77   Temp 97.4 F (36.3 C) (Oral)   Ht 5\' 4"  (1.626 m)   Wt 180 lb 12.8 oz (82 kg)   SpO2 95%   BMI 31.03 kg/m   BP/Weight 05/17/2016 04/27/2016 04/21/2016  Systolic BP 107  [id number] 117  Diastolic BP 72 72 73  Wt. (Lbs) 180.8 180.2 178.8  BMI 31.03 30.93 28.86    Physical Exam  Constitutional: She is oriented to person, place, and time. She appears well-developed and well-nourished. No distress.  HENT:  Head: Normocephalic and atraumatic.  Cardiovascular: Normal rate, regular rhythm, normal heart sounds and intact distal pulses.   Pulmonary/Chest: Effort normal and breath sounds normal.  Musculoskeletal: She exhibits no edema.       Lumbar back: Normal. She exhibits normal range of motion, no tenderness, no bony tenderness, no swelling, no edema, no deformity, no laceration, no pain, no spasm and normal pulse.  Neurological: She is alert and oriented to person, place, and time.  Skin: Skin is warm and dry. No rash noted.  Psychiatric: She has a normal mood and affect.     Assessment & Plan:  [name] was seen today for hypertension.  Diagnoses and all orders for this visit:  Essential hypertension -     furosemide (LASIX) 20 MG tablet; Take 1 tablet (20 mg total) by mouth daily. -     metoprolol succinate (TOPROL-XL) 100 MG 24 hr tablet; Take 1 tablet (100 mg total) by mouth daily. Take with or immediately following a meal. -     amLODipine (NORVASC) 5 MG tablet; Take 1 tablet (5 mg total) by mouth daily.  Right arm pain -     diclofenac sodium (VOLTAREN) 1 % GEL; Apply 2 g topically 4 (four) times daily as needed (R bicep pain). -     traMADol (ULTRAM) 50 MG tablet; Take 1 tablet (50 mg total) by mouth every 12 (twelve) hours as needed for severe pain.  Gastroesophageal reflux disease, esophagitis presence not specified -     pantoprazole (PROTONIX) 40 MG tablet; Take 1 tablet (40 mg total) by mouth daily.  Atherosclerosis of native coronary artery of native heart without angina pectoris -     furosemide (LASIX) 20 MG tablet; Take 1 tablet (20 mg total) by mouth daily. -     metoprolol succinate (TOPROL-XL) 100 MG 24 hr tablet; Take 1 tablet (100 mg  total) by mouth daily. Take with or immediately following a meal.  Other orders -     Tdap vaccine greater than or equal to 7yo IM   There are no diagnoses linked to this encounter.  No orders of the defined types were placed in this encounter.   Follow-up: Return in about 3 months (around 08/17/2016) for HTN.   Dessa PhiJosalyn Taya Ashbaugh MD

## 2016-05-17 NOTE — Assessment & Plan Note (Signed)
GERD with heartburn at night Advised patient take protonix 30 minutes before main meal protonix refilled

## 2016-05-17 NOTE — Progress Notes (Signed)
Pt is here today to follow up on HTN.  Pt needs refill on tramadol,pantoprazole, furosimide, metoprolol succinate, voltaren gel.

## 2016-05-27 ENCOUNTER — Emergency Department (HOSPITAL_COMMUNITY): Payer: Medicare Other

## 2016-05-27 ENCOUNTER — Emergency Department (HOSPITAL_COMMUNITY)
Admission: EM | Admit: 2016-05-27 | Discharge: 2016-05-27 | Disposition: A | Discharge status: Home / Self Care | Payer: Medicare Other | Attending: Emergency Medicine | Admitting: Emergency Medicine

## 2016-05-27 DIAGNOSIS — I252 Old myocardial infarction: Secondary | ICD-10-CM | POA: Diagnosis not present

## 2016-05-27 DIAGNOSIS — I10 Essential (primary) hypertension: Secondary | ICD-10-CM | POA: Insufficient documentation

## 2016-05-27 DIAGNOSIS — R0789 Other chest pain: Secondary | ICD-10-CM | POA: Insufficient documentation

## 2016-05-27 DIAGNOSIS — Z7982 Long term (current) use of aspirin: Secondary | ICD-10-CM | POA: Insufficient documentation

## 2016-05-27 DIAGNOSIS — R079 Chest pain, unspecified: Secondary | ICD-10-CM

## 2016-05-27 DIAGNOSIS — Z951 Presence of aortocoronary bypass graft: Secondary | ICD-10-CM | POA: Insufficient documentation

## 2016-05-27 DIAGNOSIS — I2510 Atherosclerotic heart disease of native coronary artery without angina pectoris: Secondary | ICD-10-CM | POA: Insufficient documentation

## 2016-05-27 LAB — BASIC METABOLIC PANEL
ANION GAP: 8 (ref 5–15)
BUN: 14 mg/dL (ref 6–20)
CALCIUM: 9 mg/dL (ref 8.9–10.3)
CHLORIDE: 103 mmol/L (ref 101–111)
CO2: 27 mmol/L (ref 22–32)
Creatinine, Ser: 0.8 mg/dL (ref 0.44–1.00)
GFR calc non Af Amer: >60 mL/min (ref >60)
Glucose, Bld: 198 mg/dL — ABNORMAL HIGH (ref 65–99)
Potassium: 3.6 mmol/L (ref 3.5–5.1)
SODIUM: 138 mmol/L (ref 135–145)

## 2016-05-27 LAB — CBC
HCT: 39.8 % (ref 36.0–46.0)
HEMOGLOBIN: 13.2 g/dL (ref 12.0–15.0)
MCH: 29.3 pg (ref 26.0–34.0)
MCHC: 33.2 g/dL (ref 30.0–36.0)
MCV: 88.2 fL (ref 78.0–100.0)
PLATELETS: 207 10*3/uL (ref 150–400)
RBC: 4.51 MIL/uL (ref 3.87–5.11)
RDW: 13.9 % (ref 11.5–15.5)
WBC: 7.7 10*3/uL (ref 4.0–10.5)

## 2016-05-27 LAB — I-STAT TROPONIN, ED: TROPONIN I, POC: 0 ng/mL (ref 0.00–0.08)

## 2016-05-27 NOTE — ED Triage Notes (Signed)
Patient arrived via [facility]. Reports 10/10 midsternal chest pain starting at approx 7:30pm tonight. Extensive previous cardiac history. Pt took two nitro while at home, [employer] gave 324 asprin, and EMS gave another two nitro. Last BP 133/70 HR 76 (irregular).

## 2016-05-27 NOTE — ED Provider Notes (Signed)
The patient is a 70 year old female with known cardiac disease, she is also known to have a prior coronary bypass graft, she presents with recurrent chest pain which has been getting worse, this is fluctuating in intensity, at this time she is chest pain-free. EKG is unchanged, labs are rather unremarkable but her history suggests significant coronary obstructive disease which she is known to have an which she does not want to have or pursue repeat grafting which was suggested. There is a very long discussion had between herself and  me and she has refused admission - has f/u with Dr. Algie CofferKadakia (tried calling X 3 - office didn't pick up) - will d/c home, pt agreeable to return if symptoms worsen.  On my eaxm has no murmurs, no tachyardia, no edema and no distress   EKG Interpretation  Date/Time:  Sunday May 27 2016 20:19:05 EST Ventricular Rate:  84 PR Interval:    QRS Duration: 112 QT Interval:  433 QTC Calculation: 512 R Axis:   2 Text Interpretation:  Sinus rhythm Inferior infarct, old Lateral leads are also involved Prolonged QT interval since last tracing no significant change Confirmed by Hyacinth MeekerMILLER  MD, Sybol Morre ([id number]) on 05/27/2016 10:13:06 PM Also confirmed by Hyacinth MeekerMILLER  MD, Natanya Holecek ([id number]), editor [place]  CCT, BEVERLY ([id number])  on 05/28/2016 7:03:50 AM      I saw and evaluated the patient, reviewed the resident's note and I agree with the findings and plan.   Final diagnoses:  Chest pain, unspecified type      Eber HongBrian Aikam Vinje, MD 05/29/16 1115

## 2016-05-27 NOTE — ED Provider Notes (Signed)
[facility]EMERGENCY DEPT Provider Note   CSN: [id number] Arrival date & time: 05/27/16  2006     History   Chief Complaint Chief Complaint  Patient presents with  . Chest Pain    HPI [name] is a 70 y.o. female.  HPI Patient is a 70 year old female with past medical history of CAD status post CABG, chronic angina, who presents with chest discomfort. Patient reports her chest pain came on after eating dinner tonight. She had what she felt like with heartburn in the center of her chest. It progressed to worsening chest pressure radiated to her bilateral shoulders. No radiation to the back. Denies nausea, vomiting, diaphoresis, shortness of breath or other associated symptoms. She took nitroglycerin 1 from her home supply with relief of her pain. However her pain returned after about 5 minutes. She took a second nitroglycerin and called EMS. Patient received full dose aspirin and additional nitroglycerin 2 prior to arrival with complete resolution of her symptoms. Patient has chronic angina and was recently started on long-acting nitrates by her cardiologist. She reports she typically experiences symptoms at night 3-4 times a week and they're always relieved with nitroglycerin 1. She was concerned because her symptoms were not relieved with her initial nitroglycerin today. Denies recent fevers, chills, cough or GI symptoms. Of note patient reports she was admitted to the hospital in October for cardiac workup. She underwent heart catheterization at that time which revealed stenosis of her bypass graft as well as worsening CAD that she was told was not amenable to stents. She reports that she was advised that a repeat CABG would be risky and she does not wish to have another CABG or stent placed.   Patient Active Problem List   Diagnosis Date Noted  . Psoriasis of scalp 04/27/2016  . CAD (coronary atherosclerotic disease) 04/27/2016  . Chest pain at rest 04/19/2016    Class: Acute  .  Precordial chest pain 04/19/2016  . Lumbago with sciatica, left side 03/10/2016  . Eczematous dermatitis of eyelid 10/20/2015  . Right arm pain 10/20/2015  . Hyperlipidemia 05/09/2015  . Environmental allergies 05/02/2015  . GERD (gastroesophageal reflux disease) 05/02/2015  . Insomnia 05/02/2015  . Arthralgia 05/02/2015  . Hypertension   . History of MI (myocardial infarction)     Past Surgical History:  Procedure Laterality Date  . CARDIAC CATHETERIZATION  04/20/2016  . CARDIAC CATHETERIZATION N/A 04/20/2016   Procedure: Left Heart Cath and Cors/Grafts Angiography;  Surgeon: Orpah Cobb, MD;  Location: [facility]ASIVE [facility];  Service: Cardiovascular;  Laterality: N/A;  . CORONARY ARTERY BYPASS GRAFT  2002   Quadruple Bypass in [place]    OB History    No data available       Home Medications    Prior to Admission medications   Medication Sig Start Date End Date Taking? Authorizing Provider  albuterol (PROVENTIL HFA;VENTOLIN HFA) 108 (90 Base) MCG/ACT inhaler Inhale 1-2 puffs into the lungs every 6 (six) hours as needed for wheezing or shortness of breath. 03/26/16   Tharon Aquas, PA  amLODipine (NORVASC) 5 MG tablet Take 1 tablet (5 mg total) by mouth daily. 05/17/16   Dessa Phi, MD  aspirin 81 MG tablet Take 1 tablet (81 mg total) by mouth daily. 10/20/15   Josalyn Funches, MD  calcium carbonate (OS-CAL) 1250 (500 Ca) MG chewable tablet Chew 1 tablet by mouth daily.    Historical Provider, MD  clobetasol (OLUX) 0.05 % topical foam Apply topically 2 (two) times  daily. To scalp for two weeks 04/27/16   Dessa Phi, MD  clopidogrel (PLAVIX) 75 MG tablet Take 1 tablet (75 mg total) by mouth daily. 04/21/16   Rinaldo Cloud, MD  cyclobenzaprine (FLEXERIL) 10 MG tablet Take 1 tablet (10 mg total) by mouth 2 (two) times daily as needed for muscle spasms. 02/23/16   Danelle Berry, PA-C  diclofenac sodium (VOLTAREN) 1 % GEL Apply 2 g topically 4 (four) times daily as needed (R  bicep pain). 05/17/16   Josalyn Funches, MD  enalapril (VASOTEC) 20 MG tablet Take 1 tablet (20 mg total) by mouth daily. 10/20/15   Josalyn Funches, MD  esomeprazole (NEXIUM) 20 MG packet Take 20 mg by mouth daily before breakfast.    Historical Provider, MD  furosemide (LASIX) 20 MG tablet Take 1 tablet (20 mg total) by mouth daily. 05/17/16   Josalyn Funches, MD  hydrocortisone valerate ointment (WEST-CORT) 0.2 % Apply 1 application topically 2 (two) times daily as needed. Eyelid dermatitis 10/20/15   Dessa Phi, MD  isosorbide mononitrate (IMDUR) 30 MG 24 hr tablet Take 1 tablet (30 mg total) by mouth daily. 04/22/16   Rinaldo Cloud, MD  KRILL OIL PO Take 1 capsule by mouth daily.    Historical Provider, MD  metoprolol succinate (TOPROL-XL) 100 MG 24 hr tablet Take 1 tablet (100 mg total) by mouth daily. Take with or immediately following a meal. 05/17/16   Josalyn Funches, MD  montelukast (SINGULAIR) 10 MG tablet Take 1 tablet (10 mg total) by mouth at bedtime. 10/20/15   Josalyn Funches, MD  nitroGLYCERIN (NITROSTAT) 0.4 MG SL tablet Place 1 tablet (0.4 mg total) under the tongue every 5 (five) minutes as needed for chest pain. Reported on 10/20/2015 03/10/16   Dessa Phi, MD  pantoprazole (PROTONIX) 40 MG tablet Take 1 tablet (40 mg total) by mouth daily. 05/17/16   Josalyn Funches, MD  rosuvastatin (CRESTOR) 20 MG tablet Take 1 tablet (20 mg total) by mouth daily. 04/27/16   Josalyn Funches, MD  traMADol (ULTRAM) 50 MG tablet Take 1 tablet (50 mg total) by mouth every 12 (twelve) hours as needed for severe pain. 05/17/16   Dessa Phi, MD    Family History Family History  Problem Relation Age of Onset  . Heart disease Mother   . Heart disease Father   . Heart disease Brother     Social History Social History  Substance Use Topics  . Smoking status: Never Smoker  . Smokeless tobacco: Never Used  . Alcohol use No     Allergies   Statins   Review of Systems Review of  Systems  Constitutional: Negative for chills and fever.  HENT: Negative for ear pain and sore throat.   Eyes: Negative for pain and visual disturbance.  Respiratory: Negative for cough and shortness of breath.   Cardiovascular: Positive for chest pain. Negative for palpitations.  Gastrointestinal: Positive for nausea. Negative for abdominal pain and vomiting.  Genitourinary: Negative for dysuria and hematuria.  Musculoskeletal: Negative for arthralgias and back pain.  Skin: Negative for color change and rash.  Neurological: Negative for seizures and syncope.  All other systems reviewed and are negative.    Physical Exam Updated Vital Signs BP 114/69   Pulse 80   Temp 97.8 F (36.6 C)   Resp 18   SpO2 97%   Physical Exam  Constitutional: She appears well-developed and well-nourished. No distress.  HENT:  Head: Normocephalic and atraumatic.  Eyes: Conjunctivae are normal.  Neck: Neck  supple.  Cardiovascular: Normal rate and regular rhythm.   No murmur heard. Pulmonary/Chest: Effort normal. No respiratory distress. She has no wheezes. She has no rales.  Abdominal: Soft. There is no tenderness.  Musculoskeletal: She exhibits no edema.  Neurological: She is alert.  Skin: Skin is warm and dry.  Psychiatric: She has a normal mood and affect.  Nursing note and vitals reviewed.    ED Treatments / Results  Labs (all labs ordered are listed, but only abnormal results are displayed) Labs Reviewed  BASIC METABOLIC PANEL - Abnormal; Notable for the following:       Result Value   Glucose, Bld 198 (*)    All other components within normal limits  CBC  I-STAT TROPOININ, ED    EKG  EKG Interpretation  Date/Time:  Sunday May 27 2016 20:19:05 EST Ventricular Rate:  84 PR Interval:    QRS Duration: 112 QT Interval:  433 QTC Calculation: 512 R Axis:   2 Text Interpretation:  Sinus rhythm Inferior infarct, old Lateral leads are also involved Prolonged QT interval since  last tracing no significant change Confirmed by Hyacinth Meeker  MD, BRIAN ([id number]) on 05/27/2016 10:13:06 PM       Radiology Dg Chest 2 View  Result Date: 05/27/2016 CLINICAL DATA:  Chest pressure beginning tonight. Radiation to the left shoulder. History of hypertension. EXAM: CHEST  2 VIEW COMPARISON:  04/19/2016 FINDINGS: Postoperative changes in the mediastinum. Borderline heart size with normal pulmonary vascularity. No focal airspace disease or consolidation in the lungs. No blunting of costophrenic angles. No pneumothorax. Calcified and tortuous aorta. Degenerative changes in the spine. IMPRESSION: Borderline cardiac enlargement. No evidence of active pulmonary disease. Electronically Signed   By: Burman Nieves M.D.   On: 05/27/2016 21:48    Procedures Procedures (including critical care time)  Medications Ordered in ED Medications - No data to display   Initial Impression / Assessment and Plan / ED Course  I have reviewed the triage vital signs and the nursing notes.  Pertinent labs & imaging results that were available during my care of the patient were reviewed by me and considered in my medical decision making (see chart for details).  Clinical Course    Patient is a 70 year old female with extensive coronary artery disease as described above who presents with chest discomfort that started this evening. She folded aspirin, nitroglycerin 4 prior to arrival with complete resolution of her pain. Exam unremarkable. CBC, BMP and i-STAT troponin unremarkable. Chest x-ray does not show acute changes. EKG does not show acute changes. Recommended admission to the hospital for further cardiac workup. However patient reports that she prefers to be discharged home. Held extensive shared making discussion with patient and husband who report understanding of the risks of going home. Patient reports understanding that her symptoms are likely cardiac related and states that she would not want any  intervention such as a CABG or a stent even if she should require one. She has a follow-up appointment scheduled with Dr. Algie Coffer (cardiologist) on Friday. Advised patient to call her cardiologist tomorrow to discuss her symptoms and possibly increasing her dose of long acting nitrates. Strict return precautions discussed. Patient reports understanding and agreement with this plan. Discharged in stable condition.   Discussed with Dr. Hyacinth Meeker, ED attending   Final diagnoses:  Chest pain, unspecified type    New Prescriptions Discharge Medication List as of 05/27/2016 10:48 PM       Isa Rankin, MD 05/28/16 0111  Eber HongBrian Miller, MD 05/29/16 1115

## 2016-05-27 NOTE — Discharge Instructions (Signed)
Call Dr. Luisa HartKakani's office tomorrow to discuss increasing her long acting nitrate. Be sure to let him know that you were seen in the emergency department tonight. Keep your follow-up appointment on Friday.  Your work up today does not exclude worsening cardiac disease. If you experience worsening chest pain that is not relieve with home medications, worsening shortness of breath or any other new concerning symptoms please return to the emergency room for evaluation.

## 2016-07-16 ENCOUNTER — Other Ambulatory Visit: Payer: Self-pay | Admitting: Family Medicine

## 2016-07-16 DIAGNOSIS — M79601 Pain in right arm: Secondary | ICD-10-CM

## 2016-07-17 NOTE — Telephone Encounter (Signed)
Tramadol Rx ready for pick up  

## 2016-08-07 ENCOUNTER — Encounter: Payer: Self-pay | Admitting: Family Medicine

## 2016-08-07 ENCOUNTER — Ambulatory Visit: Payer: Medicare Other | Attending: Family Medicine | Admitting: Family Medicine

## 2016-08-07 VITALS — BP 119/75 | HR 58 | Temp 97.5°F | Ht 64.0 in | Wt 182.2 lb

## 2016-08-07 DIAGNOSIS — M25552 Pain in left hip: Secondary | ICD-10-CM | POA: Diagnosis not present

## 2016-08-07 DIAGNOSIS — I10 Essential (primary) hypertension: Secondary | ICD-10-CM | POA: Diagnosis not present

## 2016-08-07 DIAGNOSIS — Z79899 Other long term (current) drug therapy: Secondary | ICD-10-CM | POA: Insufficient documentation

## 2016-08-07 DIAGNOSIS — Z7902 Long term (current) use of antithrombotics/antiplatelets: Secondary | ICD-10-CM | POA: Insufficient documentation

## 2016-08-07 DIAGNOSIS — I2510 Atherosclerotic heart disease of native coronary artery without angina pectoris: Secondary | ICD-10-CM | POA: Diagnosis not present

## 2016-08-07 DIAGNOSIS — Z7982 Long term (current) use of aspirin: Secondary | ICD-10-CM | POA: Diagnosis not present

## 2016-08-07 MED ORDER — NAPROXEN 500 MG PO TABS: 500.0000 mg | ORAL_TABLET | Freq: Two times a day (BID) | ORAL | 0 refills | Status: DC

## 2016-08-07 NOTE — Progress Notes (Signed)
Subjective:  Patient ID: [name], female    DOB: [birth year]  Age: 71 y.o. MRN: [id number]  CC: Hypertension   HPI [name] presents for    1. CHRONIC HYPERTENSION in setting of CAD  Disease Monitoring  Blood pressure range: not checking   Chest pain: yes, comes and goes. Improved with taking imdur at night.   Dyspnea: yes, b  Claudication: no   Medication compliance: yes  Medication Side Effects  Lightheadedness: no   Urinary frequency: no   Edema: no, improved with lasix    Impotence:    2. CAD: taking statin, plavix and imdur.   3. L hip pain: lateral pain. Was worsening. Had popping sensation. Taking crestor 20 mg, decreased dose to 10 mg 2 days ago. She denies recent trayma.   Social History  Substance Use Topics  . Smoking status: Never Smoker  . Smokeless tobacco: Never Used  . Alcohol use No   Outpatient Medications Prior to Visit  Medication Sig Dispense Refill  . albuterol (PROVENTIL HFA;VENTOLIN HFA) 108 (90 Base) MCG/ACT inhaler Inhale 1-2 puffs into the lungs every 6 (six) hours as needed for wheezing or shortness of breath. 1 Inhaler 0  . amLODipine (NORVASC) 5 MG tablet Take 1 tablet (5 mg total) by mouth daily. 90 tablet 3  . aspirin 81 MG tablet Take 1 tablet (81 mg total) by mouth daily. 90 tablet 3  . calcium carbonate (OS-CAL) 1250 (500 Ca) MG chewable tablet Chew 1 tablet by mouth daily.    . clobetasol (OLUX) 0.05 % topical foam Apply topically 2 (two) times daily. To scalp for two weeks 50 g 0  . clopidogrel (PLAVIX) 75 MG tablet Take 1 tablet (75 mg total) by mouth daily. 30 tablet 3  . cyclobenzaprine (FLEXERIL) 10 MG tablet Take 1 tablet (10 mg total) by mouth 2 (two) times daily as needed for muscle spasms. 20 tablet 0  . diclofenac sodium (VOLTAREN) 1 % GEL Apply 2 g topically 4 (four) times daily as needed (R bicep pain). 100 g 5  . enalapril (VASOTEC) 20 MG tablet Take 1 tablet (20 mg total) by mouth daily. 90 tablet 3  .  esomeprazole (NEXIUM) 20 MG packet Take 20 mg by mouth daily before breakfast.    . furosemide (LASIX) 20 MG tablet Take 1 tablet (20 mg total) by mouth daily. 90 tablet 3  . hydrocortisone valerate ointment (WEST-CORT) 0.2 % Apply 1 application topically 2 (two) times daily as needed. Eyelid dermatitis 45 g 1  . isosorbide mononitrate (IMDUR) 30 MG 24 hr tablet Take 1 tablet (30 mg total) by mouth daily. 30 tablet 3  . KRILL OIL PO Take 1 capsule by mouth daily.    . metoprolol succinate (TOPROL-XL) 100 MG 24 hr tablet Take 1 tablet (100 mg total) by mouth daily. Take with or immediately following a meal. 90 tablet 3  . montelukast (SINGULAIR) 10 MG tablet Take 1 tablet (10 mg total) by mouth at bedtime. 90 tablet 3  . nitroGLYCERIN (NITROSTAT) 0.4 MG SL tablet Place 1 tablet (0.4 mg total) under the tongue every 5 (five) minutes as needed for chest pain. Reported on 10/20/2015 20 tablet 3  . pantoprazole (PROTONIX) 40 MG tablet Take 1 tablet (40 mg total) by mouth daily. 90 tablet 3  . rosuvastatin (CRESTOR) 20 MG tablet Take 1 tablet (20 mg total) by mouth daily. 30 tablet 3  . traMADol (ULTRAM) 50 MG tablet TAKE ONE TABLET BY MOUTH every  twelve HOURS AS NEEDED FOR SEVERE pain 60 tablet 0   No facility-administered medications prior to visit.     ROS Review of Systems  Constitutional: Negative for chills and fever.  Eyes: Negative for visual disturbance.  Respiratory: Positive for shortness of breath.   Cardiovascular: Positive for chest pain.  Gastrointestinal: Negative for abdominal pain and blood in stool.  Musculoskeletal: Positive for myalgias. Negative for arthralgias and back pain.  Allergic/Immunologic: Negative for immunocompromised state.  Hematological: Negative for adenopathy. Does not bruise/bleed easily.  Psychiatric/Behavioral: Negative for dysphoric mood and suicidal ideas.    Objective:  BP 119/75 (BP Location: Left Arm, Patient Position: Sitting, Cuff Size: Small)    Pulse (!) 58   Temp 97.5 F (36.4 C) (Oral)   Ht 5\' 4"  (1.626 m)   Wt 182 lb 3.2 oz (82.6 kg)   SpO2 95%   BMI 31.27 kg/m   BP/Weight 08/07/2016 05/27/2016 05/17/2016  Systolic BP 119 114 107  Diastolic BP 75 69 72  Wt. (Lbs) 182.2 - 180.8  BMI 31.27 - 31.03   Wt Readings from Last 3 Encounters:  08/07/16 182 lb 3.2 oz (82.6 kg)  05/17/16 180 lb 12.8 oz (82 kg)  04/27/16 180 lb 3.2 oz (81.7 kg)    Physical Exam  Constitutional: She is oriented to person, place, and time. She appears well-developed and well-nourished. No distress.  HENT:  Head: Normocephalic and atraumatic.  Cardiovascular: Normal rate, regular rhythm, normal heart sounds and intact distal pulses.   Pulmonary/Chest: Effort normal and breath sounds normal.  Musculoskeletal: She exhibits no edema.       Left hip: She exhibits tenderness.       Lumbar back: Normal. She exhibits normal range of motion, no tenderness, no bony tenderness, no swelling, no edema, no deformity, no laceration, no pain, no spasm and normal pulse.       Legs: Neurological: She is alert and oriented to person, place, and time.  Skin: Skin is warm and dry. No rash noted.  Psychiatric: She has a normal mood and affect.     Assessment & Plan:  [name] was seen today for hypertension.  Diagnoses and all orders for this visit:  Left hip pain -     naproxen (NAPROSYN) 500 MG tablet; Take 1 tablet (500 mg total) by mouth 2 (two) times daily with a meal.  Essential hypertension   There are no diagnoses linked to this encounter.  No orders of the defined types were placed in this encounter.   Follow-up: Return in about 3 months (around 11/04/2016) for HTN .   Dessa PhiJosalyn Sierra Hirschi MD

## 2016-08-07 NOTE — Progress Notes (Signed)
Pt is having pain in left leg at night.(muscle pain)

## 2016-08-07 NOTE — Assessment & Plan Note (Signed)
Muscle pain with crestor She has been intolerant of statins in the past She has CAD Plan: crestor 10 mg daily Short course of NSAID

## 2016-08-07 NOTE — Assessment & Plan Note (Signed)
Well controlled Slight bradycardia Continue current regimen

## 2016-08-07 NOTE — Patient Instructions (Addendum)
[name] was seen today for hypertension.  Diagnoses and all orders for this visit:  Left hip pain -     naproxen (NAPROSYN) 500 MG tablet; Take 1 tablet (500 mg total) by mouth 2 (two) times daily with a meal.  Essential hypertension  your blood pressure is excellent, no changes needed.  Take naproxen twice daily with food for next 5 days  Continue crestor 10 mg daily with plan to transition to pravastatin once you are out of crestor  F/u in 3 months for HTN, sooner if needed   Dr. Armen PickupFunches

## 2016-08-20 ENCOUNTER — Other Ambulatory Visit: Payer: Self-pay | Admitting: Family Medicine

## 2016-08-20 DIAGNOSIS — I252 Old myocardial infarction: Secondary | ICD-10-CM

## 2016-08-21 ENCOUNTER — Other Ambulatory Visit: Payer: Self-pay | Admitting: Family Medicine

## 2016-09-03 ENCOUNTER — Other Ambulatory Visit: Payer: Self-pay | Admitting: Family Medicine

## 2016-09-03 DIAGNOSIS — I252 Old myocardial infarction: Secondary | ICD-10-CM

## 2016-09-03 DIAGNOSIS — M79601 Pain in right arm: Secondary | ICD-10-CM

## 2016-09-04 NOTE — Telephone Encounter (Signed)
Please inform patient Tramadol ready for pick up  

## 2016-09-05 NOTE — Telephone Encounter (Signed)
Pt was called and informed of script being ready for pick up.

## 2016-09-26 ENCOUNTER — Other Ambulatory Visit: Payer: Self-pay | Admitting: Family Medicine

## 2016-10-01 ENCOUNTER — Other Ambulatory Visit: Payer: Self-pay | Admitting: Family Medicine

## 2016-10-01 DIAGNOSIS — Z9109 Other allergy status, other than to drugs and biological substances: Secondary | ICD-10-CM

## 2016-10-15 DIAGNOSIS — E876 Hypokalemia: Secondary | ICD-10-CM | POA: Diagnosis not present

## 2016-10-15 DIAGNOSIS — D649 Anemia, unspecified: Secondary | ICD-10-CM | POA: Diagnosis not present

## 2016-10-15 DIAGNOSIS — E119 Type 2 diabetes mellitus without complications: Secondary | ICD-10-CM | POA: Diagnosis not present

## 2016-10-29 ENCOUNTER — Other Ambulatory Visit: Payer: Self-pay | Admitting: Family Medicine

## 2016-10-29 DIAGNOSIS — I10 Essential (primary) hypertension: Secondary | ICD-10-CM

## 2016-10-29 DIAGNOSIS — I252 Old myocardial infarction: Secondary | ICD-10-CM

## 2016-11-14 ENCOUNTER — Encounter: Payer: Self-pay | Admitting: Family Medicine

## 2016-12-03 DIAGNOSIS — R69 Illness, unspecified: Secondary | ICD-10-CM | POA: Diagnosis not present

## 2016-12-20 ENCOUNTER — Encounter: Payer: Self-pay | Admitting: Family Medicine

## 2016-12-20 ENCOUNTER — Ambulatory Visit: Payer: Medicare HMO | Attending: Family Medicine | Admitting: Family Medicine

## 2016-12-20 VITALS — BP 105/71 | HR 75 | Temp 98.4°F | Wt 175.8 lb

## 2016-12-20 DIAGNOSIS — E785 Hyperlipidemia, unspecified: Secondary | ICD-10-CM | POA: Insufficient documentation

## 2016-12-20 DIAGNOSIS — I10 Essential (primary) hypertension: Secondary | ICD-10-CM | POA: Insufficient documentation

## 2016-12-20 DIAGNOSIS — M5442 Lumbago with sciatica, left side: Secondary | ICD-10-CM | POA: Diagnosis not present

## 2016-12-20 DIAGNOSIS — K219 Gastro-esophageal reflux disease without esophagitis: Secondary | ICD-10-CM | POA: Insufficient documentation

## 2016-12-20 DIAGNOSIS — Z79899 Other long term (current) drug therapy: Secondary | ICD-10-CM | POA: Insufficient documentation

## 2016-12-20 DIAGNOSIS — Z7902 Long term (current) use of antithrombotics/antiplatelets: Secondary | ICD-10-CM | POA: Diagnosis not present

## 2016-12-20 DIAGNOSIS — G8929 Other chronic pain: Secondary | ICD-10-CM | POA: Diagnosis not present

## 2016-12-20 DIAGNOSIS — R0600 Dyspnea, unspecified: Secondary | ICD-10-CM | POA: Insufficient documentation

## 2016-12-20 DIAGNOSIS — Z7982 Long term (current) use of aspirin: Secondary | ICD-10-CM | POA: Diagnosis not present

## 2016-12-20 DIAGNOSIS — R7309 Other abnormal glucose: Secondary | ICD-10-CM | POA: Diagnosis not present

## 2016-12-20 DIAGNOSIS — I2510 Atherosclerotic heart disease of native coronary artery without angina pectoris: Secondary | ICD-10-CM | POA: Insufficient documentation

## 2016-12-20 DIAGNOSIS — I252 Old myocardial infarction: Secondary | ICD-10-CM | POA: Diagnosis not present

## 2016-12-20 DIAGNOSIS — R079 Chest pain, unspecified: Secondary | ICD-10-CM | POA: Insufficient documentation

## 2016-12-20 MED ORDER — PANTOPRAZOLE SODIUM 40 MG PO TBEC: 40.0000 mg | DELAYED_RELEASE_TABLET | Freq: Every day | ORAL | 3 refills | Status: DC

## 2016-12-20 MED ORDER — TRAMADOL HCL 50 MG PO TABS: ORAL_TABLET | ORAL | 2 refills | Status: DC

## 2016-12-20 NOTE — Assessment & Plan Note (Signed)
Persistent moderate low back pain Controlled with prn naproxen and tramadol Tramadol refilled today Patient advised of 3 month f/u requirement for chronic tramadol

## 2016-12-20 NOTE — Progress Notes (Signed)
Subjective:  Patient ID: [name], female    DOB: 11-04-1945  Age: 71 y.o. MRN: [id number]  CC: Hypertension   HPI [name] has HTN, HLD, CAD s/p MI followed closely by cardioogy (Dr. Eden EmmsNishan) presents for    1. CHRONIC HYPERTENSION in setting of CAD  Disease Monitoring  Blood pressure range: not checking   Chest pain: yes, comes and goes. Improved with taking imdur at night.   Dyspnea: yes, b  Claudication: no   Medication compliance: yes  Medication Side Effects  Lightheadedness: no   Urinary frequency: no   Edema: no, improved with lasix    Impotence:    2. CAD: treated with medical management. taking crestor 10 mg daily, plavix and imdur. She has intermittent chest pain. No SOB. No edema. She is active with walking at home. She is followed closely by her cardiologist she reports starting metformin 250 mg BID (1/2 500 mg tablet) for elevated A1c.   3. Low back pain: this is chronic. She has lumbar DJD. She has low back pain and stiffness. Pain is 4-5/10. Improved with tramadol which she takes once daily but would like to take twice daily. Pain did but no longer radiates down to left gluteal area or legs.   4. GERD: controlled with Protonix 40 mg daily. She avoid citrus. She pays attention to food triggers. She avoid overeating.   Social History  Substance Use Topics  . Smoking status: Never Smoker  . Smokeless tobacco: Never Used  . Alcohol use No   Outpatient Medications Prior to Visit  Medication Sig Dispense Refill  . albuterol (PROVENTIL HFA;VENTOLIN HFA) 108 (90 Base) MCG/ACT inhaler Inhale 1-2 puffs into the lungs every 6 (six) hours as needed for wheezing or shortness of breath. 1 Inhaler 0  . amLODipine (NORVASC) 5 MG tablet Take 1 tablet (5 mg total) by mouth daily. 90 tablet 3  . aspirin 81 MG tablet Take 1 tablet (81 mg total) by mouth daily. 90 tablet 3  . calcium carbonate (OS-CAL) 1250 (500 Ca) MG chewable tablet Chew 1 tablet by mouth daily.     . clobetasol (OLUX) 0.05 % topical foam Apply topically 2 (two) times daily. To scalp for two weeks 50 g 0  . clopidogrel (PLAVIX) 75 MG tablet Take 1 tablet (75 mg total) by mouth daily. 30 tablet 3  . cyclobenzaprine (FLEXERIL) 10 MG tablet Take 1 tablet (10 mg total) by mouth 2 (two) times daily as needed for muscle spasms. 20 tablet 0  . diclofenac sodium (VOLTAREN) 1 % GEL Apply 2 g topically 4 (four) times daily as needed (R bicep pain). 100 g 5  . enalapril (VASOTEC) 20 MG tablet TAKE ONE TABLET BY MOUTH EVERY DAY 30 tablet 2  . esomeprazole (NEXIUM) 20 MG packet Take 20 mg by mouth daily before breakfast.    . hydrocortisone valerate ointment (WEST-CORT) 0.2 % Apply 1 application topically 2 (two) times daily as needed. Eyelid dermatitis 45 g 1  . isosorbide mononitrate (IMDUR) 30 MG 24 hr tablet Take 1 tablet (30 mg total) by mouth daily. 30 tablet 3  . KRILL OIL PO Take 1 capsule by mouth daily.    . metoprolol succinate (TOPROL-XL) 100 MG 24 hr tablet Take 1 tablet (100 mg total) by mouth daily. Take with or immediately following a meal. 90 tablet 3  . montelukast (SINGULAIR) 10 MG tablet TAKE ONE TABLET BY MOUTH AT BEDTIME 30 tablet 2  . naproxen (NAPROSYN) 500 MG tablet  Take 1 tablet (500 mg total) by mouth 2 (two) times daily with a meal. 10 tablet 0  . NITROSTAT 0.4 MG SL tablet Place 1 tablet (0.4 mg total) under the tongue every 5 (five) minutes as needed for chest pain. Reported on 10/20/2015 25 tablet 2  . pantoprazole (PROTONIX) 40 MG tablet Take 1 tablet (40 mg total) by mouth daily. 90 tablet 3  . rosuvastatin (CRESTOR) 20 MG tablet Take 1 tablet (20 mg total) by mouth daily. 30 tablet 3  . traMADol (ULTRAM) 50 MG tablet TAKE ONE TABLET BY MOUTH every twelve HOURS AS NEEDED FOR SEVERE pain 60 tablet 2  . furosemide (LASIX) 20 MG tablet Take 1 tablet (20 mg total) by mouth daily. (Patient not taking: Reported on 08/07/2016) 90 tablet 3   No facility-administered medications  prior to visit.     ROS Review of Systems  Constitutional: Negative for chills and fever.  Eyes: Negative for visual disturbance.  Respiratory: Negative for shortness of breath.   Cardiovascular: Positive for chest pain.  Gastrointestinal: Negative for abdominal pain and blood in stool.  Musculoskeletal: Positive for back pain. Negative for arthralgias and myalgias.  Allergic/Immunologic: Negative for immunocompromised state.  Hematological: Negative for adenopathy. Does not bruise/bleed easily.  Psychiatric/Behavioral: Negative for dysphoric mood and suicidal ideas.    Objective:  BP 105/71   Pulse 75   Temp 98.4 F (36.9 C) (Oral)   Wt 175 lb 12.8 oz (79.7 kg)   SpO2 92%   BMI 30.18 kg/m   BP/Weight 12/20/2016 08/07/2016 05/27/2016  Systolic BP 105 119 114  Diastolic BP 71 75 69  Wt. (Lbs) 175.8 182.2 -  BMI 30.18 31.27 -   Wt Readings from Last 3 Encounters:  12/20/16 175 lb 12.8 oz (79.7 kg)  08/07/16 182 lb 3.2 oz (82.6 kg)  05/17/16 180 lb 12.8 oz (82 kg)    Physical Exam  Constitutional: She is oriented to person, place, and time. She appears well-developed and well-nourished. No distress.  HENT:  Head: Normocephalic and atraumatic.  Neck: Carotid bruit is not present.  Cardiovascular: Normal rate, regular rhythm, normal heart sounds and intact distal pulses.   Pulmonary/Chest: Effort normal and breath sounds normal.  Musculoskeletal: She exhibits no edema.       Lumbar back: She exhibits tenderness, pain and spasm. She exhibits normal range of motion, no bony tenderness, no swelling, no edema, no deformity, no laceration and normal pulse.  Neurological: She is alert and oriented to person, place, and time.  Skin: Skin is warm and dry. No rash noted.  Psychiatric: She has a normal mood and affect.   . Lab Results  Component Value Date   HGBA1C 6.2 10/20/2015    Assessment & Plan:  [name] was seen today for hypertension.  Diagnoses and all orders for  this visit:  Chronic left-sided low back pain with left-sided sciatica -     traMADol (ULTRAM) 50 MG tablet; TAKE ONE TABLET BY MOUTH every twelve HOURS AS NEEDED FOR SEVERE pain  Gastroesophageal reflux disease, esophagitis presence not specified -     pantoprazole (PROTONIX) 40 MG tablet; Take 1 tablet (40 mg total) by mouth daily.  Elevated hemoglobin A1c  Atherosclerosis of native coronary artery of native heart without angina pectoris   There are no diagnoses linked to this encounter.  No orders of the defined types were placed in this encounter.   Follow-up: Return in about 3 months (around 03/22/2017) for meet new PCP/tramadol .  Boykin Nearing MD

## 2016-12-20 NOTE — Patient Instructions (Addendum)
[name] was seen today for hypertension.  Diagnoses and all orders for this visit:  Chronic left-sided low back pain with left-sided sciatica -     traMADol (ULTRAM) 50 MG tablet; TAKE ONE TABLET BY MOUTH every twelve HOURS AS NEEDED FOR SEVERE pain  Gastroesophageal reflux disease, esophagitis presence not specified -     pantoprazole (PROTONIX) 40 MG tablet; Take 1 tablet (40 mg total) by mouth daily.  Elevated hemoglobin A1c  Atherosclerosis of native coronary artery of native heart without angina pectoris   F/u in 3 months to meet new PCP and tramadol refill   Dr. Armen PickupFunches

## 2016-12-20 NOTE — Assessment & Plan Note (Signed)
Controlled Refilled protonix

## 2016-12-20 NOTE — Assessment & Plan Note (Signed)
CAD on medical management She has a strong family history of CAD Plan: Continue current treatment Continue current walking program

## 2016-12-24 ENCOUNTER — Other Ambulatory Visit: Payer: Self-pay | Admitting: Family Medicine

## 2016-12-24 DIAGNOSIS — I252 Old myocardial infarction: Secondary | ICD-10-CM

## 2016-12-24 DIAGNOSIS — I10 Essential (primary) hypertension: Secondary | ICD-10-CM

## 2016-12-24 DIAGNOSIS — Z9109 Other allergy status, other than to drugs and biological substances: Secondary | ICD-10-CM

## 2017-01-09 ENCOUNTER — Other Ambulatory Visit: Payer: Self-pay | Admitting: Family Medicine

## 2017-01-09 DIAGNOSIS — Z9109 Other allergy status, other than to drugs and biological substances: Secondary | ICD-10-CM

## 2017-01-09 DIAGNOSIS — I10 Essential (primary) hypertension: Secondary | ICD-10-CM

## 2017-01-09 DIAGNOSIS — I252 Old myocardial infarction: Secondary | ICD-10-CM

## 2017-01-17 ENCOUNTER — Other Ambulatory Visit: Payer: Self-pay | Admitting: Family Medicine

## 2017-01-17 DIAGNOSIS — R7309 Other abnormal glucose: Secondary | ICD-10-CM

## 2017-01-17 DIAGNOSIS — E7800 Pure hypercholesterolemia, unspecified: Secondary | ICD-10-CM

## 2017-01-17 DIAGNOSIS — I10 Essential (primary) hypertension: Secondary | ICD-10-CM

## 2017-01-17 DIAGNOSIS — I252 Old myocardial infarction: Secondary | ICD-10-CM

## 2017-01-17 MED ORDER — ENALAPRIL MALEATE 20 MG PO TABS: 20.0000 mg | ORAL_TABLET | Freq: Every day | ORAL | 3 refills | Status: DC

## 2017-01-17 MED ORDER — ROSUVASTATIN CALCIUM 20 MG PO TABS: 20.0000 mg | ORAL_TABLET | Freq: Every day | ORAL | 3 refills | Status: AC

## 2017-01-17 MED ORDER — METFORMIN HCL 500 MG PO TABS: 250.0000 mg | ORAL_TABLET | Freq: Two times a day (BID) | ORAL | 3 refills | Status: DC

## 2017-01-21 DIAGNOSIS — R072 Precordial pain: Secondary | ICD-10-CM | POA: Diagnosis not present

## 2017-01-21 DIAGNOSIS — I2510 Atherosclerotic heart disease of native coronary artery without angina pectoris: Secondary | ICD-10-CM | POA: Diagnosis not present

## 2017-01-21 DIAGNOSIS — I10 Essential (primary) hypertension: Secondary | ICD-10-CM | POA: Diagnosis not present

## 2017-01-21 DIAGNOSIS — Z951 Presence of aortocoronary bypass graft: Secondary | ICD-10-CM | POA: Diagnosis not present

## 2017-02-11 ENCOUNTER — Other Ambulatory Visit: Payer: Self-pay | Admitting: Family Medicine

## 2017-02-11 DIAGNOSIS — I2510 Atherosclerotic heart disease of native coronary artery without angina pectoris: Secondary | ICD-10-CM

## 2017-02-11 DIAGNOSIS — I10 Essential (primary) hypertension: Secondary | ICD-10-CM

## 2017-02-25 ENCOUNTER — Other Ambulatory Visit: Payer: Self-pay | Admitting: Family Medicine

## 2017-02-25 DIAGNOSIS — I10 Essential (primary) hypertension: Secondary | ICD-10-CM

## 2017-02-25 DIAGNOSIS — G8929 Other chronic pain: Secondary | ICD-10-CM

## 2017-02-25 DIAGNOSIS — M5442 Lumbago with sciatica, left side: Secondary | ICD-10-CM

## 2017-03-01 DIAGNOSIS — R69 Illness, unspecified: Secondary | ICD-10-CM | POA: Diagnosis not present

## 2017-03-25 ENCOUNTER — Other Ambulatory Visit: Payer: Self-pay | Admitting: Family Medicine

## 2017-03-25 DIAGNOSIS — I252 Old myocardial infarction: Secondary | ICD-10-CM

## 2017-03-25 DIAGNOSIS — I10 Essential (primary) hypertension: Secondary | ICD-10-CM

## 2017-03-26 DIAGNOSIS — R072 Precordial pain: Secondary | ICD-10-CM | POA: Diagnosis not present

## 2017-03-26 DIAGNOSIS — I2510 Atherosclerotic heart disease of native coronary artery without angina pectoris: Secondary | ICD-10-CM | POA: Diagnosis not present

## 2017-03-26 DIAGNOSIS — Z951 Presence of aortocoronary bypass graft: Secondary | ICD-10-CM | POA: Diagnosis not present

## 2017-03-26 DIAGNOSIS — I10 Essential (primary) hypertension: Secondary | ICD-10-CM | POA: Diagnosis not present

## 2017-04-05 DIAGNOSIS — M5136 Other intervertebral disc degeneration, lumbar region: Secondary | ICD-10-CM | POA: Diagnosis not present

## 2017-04-05 DIAGNOSIS — M25562 Pain in left knee: Secondary | ICD-10-CM | POA: Diagnosis not present

## 2017-04-05 DIAGNOSIS — M1712 Unilateral primary osteoarthritis, left knee: Secondary | ICD-10-CM | POA: Diagnosis not present

## 2017-04-05 DIAGNOSIS — M545 Low back pain: Secondary | ICD-10-CM | POA: Diagnosis not present

## 2017-04-05 DIAGNOSIS — M25561 Pain in right knee: Secondary | ICD-10-CM | POA: Diagnosis not present

## 2017-04-05 DIAGNOSIS — M2351 Chronic instability of knee, right knee: Secondary | ICD-10-CM | POA: Diagnosis not present

## 2017-04-05 DIAGNOSIS — M1711 Unilateral primary osteoarthritis, right knee: Secondary | ICD-10-CM | POA: Diagnosis not present

## 2017-04-05 DIAGNOSIS — M2352 Chronic instability of knee, left knee: Secondary | ICD-10-CM | POA: Diagnosis not present

## 2017-04-22 ENCOUNTER — Other Ambulatory Visit: Payer: Self-pay | Admitting: Pharmacist

## 2017-04-22 DIAGNOSIS — I252 Old myocardial infarction: Secondary | ICD-10-CM

## 2017-04-22 DIAGNOSIS — I10 Essential (primary) hypertension: Secondary | ICD-10-CM

## 2017-04-22 MED ORDER — ENALAPRIL MALEATE 20 MG PO TABS: 20.0000 mg | ORAL_TABLET | Freq: Every day | ORAL | 0 refills | Status: DC

## 2017-05-06 ENCOUNTER — Ambulatory Visit: Payer: Medicare HMO | Attending: Internal Medicine | Admitting: Internal Medicine

## 2017-05-06 ENCOUNTER — Encounter: Payer: Self-pay | Admitting: Internal Medicine

## 2017-05-06 VITALS — BP 108/72 | HR 93 | Temp 97.8°F | Resp 20 | Ht 65.0 in | Wt 173.0 lb

## 2017-05-06 DIAGNOSIS — Z9889 Other specified postprocedural states: Secondary | ICD-10-CM | POA: Insufficient documentation

## 2017-05-06 DIAGNOSIS — R079 Chest pain, unspecified: Secondary | ICD-10-CM | POA: Insufficient documentation

## 2017-05-06 DIAGNOSIS — J45909 Unspecified asthma, uncomplicated: Secondary | ICD-10-CM | POA: Insufficient documentation

## 2017-05-06 DIAGNOSIS — Z79891 Long term (current) use of opiate analgesic: Secondary | ICD-10-CM | POA: Diagnosis not present

## 2017-05-06 DIAGNOSIS — Z7902 Long term (current) use of antithrombotics/antiplatelets: Secondary | ICD-10-CM | POA: Insufficient documentation

## 2017-05-06 DIAGNOSIS — K219 Gastro-esophageal reflux disease without esophagitis: Secondary | ICD-10-CM

## 2017-05-06 DIAGNOSIS — Z888 Allergy status to other drugs, medicaments and biological substances status: Secondary | ICD-10-CM | POA: Insufficient documentation

## 2017-05-06 DIAGNOSIS — I25810 Atherosclerosis of coronary artery bypass graft(s) without angina pectoris: Secondary | ICD-10-CM | POA: Insufficient documentation

## 2017-05-06 DIAGNOSIS — Z8249 Family history of ischemic heart disease and other diseases of the circulatory system: Secondary | ICD-10-CM | POA: Diagnosis not present

## 2017-05-06 DIAGNOSIS — Z2821 Immunization not carried out because of patient refusal: Secondary | ICD-10-CM

## 2017-05-06 DIAGNOSIS — E785 Hyperlipidemia, unspecified: Secondary | ICD-10-CM | POA: Diagnosis not present

## 2017-05-06 DIAGNOSIS — I10 Essential (primary) hypertension: Secondary | ICD-10-CM | POA: Insufficient documentation

## 2017-05-06 DIAGNOSIS — R110 Nausea: Secondary | ICD-10-CM | POA: Diagnosis not present

## 2017-05-06 DIAGNOSIS — Z79899 Other long term (current) drug therapy: Secondary | ICD-10-CM | POA: Diagnosis not present

## 2017-05-06 DIAGNOSIS — R7303 Prediabetes: Secondary | ICD-10-CM | POA: Insufficient documentation

## 2017-05-06 DIAGNOSIS — M5442 Lumbago with sciatica, left side: Secondary | ICD-10-CM | POA: Diagnosis not present

## 2017-05-06 DIAGNOSIS — G8929 Other chronic pain: Secondary | ICD-10-CM | POA: Insufficient documentation

## 2017-05-06 DIAGNOSIS — I25718 Atherosclerosis of autologous vein coronary artery bypass graft(s) with other forms of angina pectoris: Secondary | ICD-10-CM | POA: Diagnosis not present

## 2017-05-06 DIAGNOSIS — I252 Old myocardial infarction: Secondary | ICD-10-CM | POA: Diagnosis not present

## 2017-05-06 DIAGNOSIS — Z7982 Long term (current) use of aspirin: Secondary | ICD-10-CM | POA: Diagnosis not present

## 2017-05-06 LAB — GLUCOSE, POCT (MANUAL RESULT ENTRY): POC Glucose: 122 mg/dl — AB (ref 70–99)

## 2017-05-06 LAB — POCT GLYCOSYLATED HEMOGLOBIN (HGB A1C): HEMOGLOBIN A1C: 6.2

## 2017-05-06 MED ORDER — METFORMIN HCL 500 MG PO TABS: 500.0000 mg | ORAL_TABLET | Freq: Two times a day (BID) | ORAL | 3 refills | Status: DC

## 2017-05-06 NOTE — Progress Notes (Signed)
Pt is here for follow up. She needs to  establish care With PCP.   Would like refill on clomatrazole CBG- 122 HgA1C-6.2

## 2017-05-06 NOTE — Patient Instructions (Addendum)
Use your back brace to see if it helps. If not, we can refer to a spine specialist.   Try to avoid foods that cause heart burn for you.  Speak with cardiologist about the type of exercise activity he would recommend for you.

## 2017-05-06 NOTE — Progress Notes (Signed)
Patient ID: [name], female    DOB: Jul 14, 1945  MRN: [id number]  CC: No chief complaint on file.   Subjective: [name] is a 71 y.o. female who presents for chronic disease management. Last saw Dr. Armen PickupFunches 11/2016. Her spouse, Smitty CordsBruce, is with her Her concerns today include:  Patient with history of CAD s/p CADG x 4 (MI fall 2017 for medical management, Dr.Kadakia), HTN, HLD, chronic LBP (LT sided sciatica on Naprosyn and  tramadol), GERD, / asthma, preDM  1. LBP: my sciatica has been bothering her a lot -radiates down LT leg. No numbness or tingling -just got a back brace, has not used as yet -prefers [employer] Hot and heating pad. Tramadol causes nausea, takes only when she really needs it. Not taking Naprosyn -pain more at nights because she sleeps on her back -stiff in mornings, better once she gets moving around and does leg exercises  2. CAD/s/p CADG x 4:  -compliant with meds -used SL Nitro two nights ago. Took 2. Has to use once every few wks.  3. GERD:  "I can't live without Hot Sauce." She avoids other foods that she knows causes heart burn for her. -takes Omeprazole or Nexium OTC. Not on Protonix  4. Pre-DM: -she stopped eating ice cream, cake, cookies.  Lost 9 pounds since February. Cardiologist inc Metformin to 500 mg BID -limited physical activity after MI in fall of 2017. Prior to this she went to gym several times a wk. Walks a lot in her yard.   HM: declines MMG, colonoscopy, flu shot  Patient Active Problem List   Diagnosis Date Noted  . Elevated hemoglobin A1c 12/20/2016  . Psoriasis of scalp 04/27/2016  . CAD (coronary atherosclerotic disease) 04/27/2016  . Chest pain at rest 04/19/2016    Class: Acute  . Precordial chest pain 04/19/2016  . Lumbago with sciatica, left side 03/10/2016  . Eczematous dermatitis of eyelid 10/20/2015  . Hyperlipidemia 05/09/2015  . Environmental allergies 05/02/2015  . GERD (gastroesophageal reflux disease)  05/02/2015  . Insomnia 05/02/2015  . Arthralgia 05/02/2015  . Hypertension   . History of MI (myocardial infarction)      Current Outpatient Medications on File Prior to Visit  Medication Sig Dispense Refill  . albuterol (PROVENTIL HFA;VENTOLIN HFA) 108 (90 Base) MCG/ACT inhaler Inhale 1-2 puffs into the lungs every 6 (six) hours as needed for wheezing or shortness of breath. 1 Inhaler 0  . amLODipine (NORVASC) 5 MG tablet Take 1 tablet (5 mg total) by mouth daily. 90 tablet 3  . aspirin 81 MG tablet Take 1 tablet (81 mg total) by mouth daily. 90 tablet 3  . calcium carbonate (OS-CAL) 1250 (500 Ca) MG chewable tablet Chew 1 tablet by mouth daily.    . clopidogrel (PLAVIX) 75 MG tablet Take 1 tablet (75 mg total) by mouth daily. 30 tablet 3  . enalapril (VASOTEC) 20 MG tablet Take 1 tablet (20 mg total) by mouth daily. 30 tablet 0  . esomeprazole (NEXIUM) 20 MG packet Take 20 mg by mouth daily before breakfast.    . hydrocortisone valerate ointment (WEST-CORT) 0.2 % Apply 1 application topically 2 (two) times daily as needed. Eyelid dermatitis 45 g 1  . isosorbide mononitrate (IMDUR) 30 MG 24 hr tablet Take 1 tablet (30 mg total) by mouth daily. 30 tablet 3  . metoprolol succinate (TOPROL-XL) 100 MG 24 hr tablet Take 1 tablet (100 mg total) by mouth daily. Take with or immediately following a meal. 90  tablet 0  . montelukast (SINGULAIR) 10 MG tablet TAKE ONE TABLET BY MOUTH AT BEDTIME 30 tablet 2  . NITROSTAT 0.4 MG SL tablet Place 1 tablet (0.4 mg total) under the tongue every 5 (five) minutes as needed for chest pain. Reported on 10/20/2015 25 tablet 2  . rosuvastatin (CRESTOR) 20 MG tablet Take 1 tablet (20 mg total) by mouth daily. 90 tablet 3  . traMADol (ULTRAM) 50 MG tablet TAKE ONE TABLET BY MOUTH every twelve HOURS AS NEEDED FOR SEVERE pain 60 tablet 2  . cyclobenzaprine (FLEXERIL) 10 MG tablet Take 1 tablet (10 mg total) by mouth 2 (two) times daily as needed for muscle spasms.  (Patient not taking: Reported on 05/06/2017) 20 tablet 0  . diclofenac sodium (VOLTAREN) 1 % GEL Apply 2 g topically 4 (four) times daily as needed (R bicep pain). (Patient not taking: Reported on 05/06/2017) 100 g 5  . furosemide (LASIX) 20 MG tablet Take 1 tablet (20 mg total) by mouth daily. (Patient not taking: Reported on 08/07/2016) 90 tablet 3  . KRILL OIL PO Take 1 capsule by mouth daily.     No current facility-administered medications on file prior to visit.     Allergies  Allergen Reactions  . Statins Other (See Comments)    Muscle aches     Social History   Socioeconomic History  . Marital status: Divorced    Spouse name: Not on file  . Number of children: Not on file  . Years of education: Not on file  . Highest education level: Not on file  Social Needs  . Financial resource strain: Not on file  . Food insecurity - worry: Not on file  . Food insecurity - inability: Not on file  . Transportation needs - medical: Not on file  . Transportation needs - non-medical: Not on file  Occupational History  . Occupation: retired   Tobacco Use  . Smoking status: Never Smoker  . Smokeless tobacco: Never Used  Substance and Sexual Activity  . Alcohol use: No    Alcohol/week: 0.0 oz  . Drug use: No  . Sexual activity: No  Other Topics Concern  . Not on file  Social History Narrative  . Not on file    Family History  Problem Relation Age of Onset  . Heart disease Mother   . Heart disease Father   . Heart disease Brother     Past Surgical History:  Procedure Laterality Date  . CARDIAC CATHETERIZATION  04/20/2016  . CORONARY ARTERY BYPASS GRAFT  2002   Quadruple Bypass in [place]    ROS: Review of Systems Negative except as stated above   PHYSICAL EXAM: BP 108/72 (BP Location: Left Arm, Patient Position: Sitting)   Pulse 93   Temp 97.8 F (36.6 C) (Oral)   Resp 20   Ht 5\' 5"  (1.651 m)   Wt 173 lb (78.5 kg)   SpO2 96%   BMI 28.79 kg/m   Wt Readings from  Last 3 Encounters:  05/06/17 173 lb (78.5 kg)  12/20/16 175 lb 12.8 oz (79.7 kg)  08/07/16 182 lb 3.2 oz (82.6 kg)    Physical Exam General appearance - alert, well appearing, and in no distress Mental status - alert, oriented to person, place, and time, normal mood, behavior, speech, dress, motor activity, and thought processes Eyes - pupils equal and reactive, extraocular eye movements intact Mouth - mucous membranes moist, pharynx normal without lesions Neck - supple, no significant adenopathy  Chest - clear to auscultation, no wheezes, rales or rhonchi, symmetric air entry Heart - normal rate, regular rhythm, normal S1, S2, no murmurs, rubs, clicks or gallops Extremities -LLE larger than RT. Chronic  post CABG, vein harvested from LT  A1C 6.2 BS 122  ASSESSMENT AND PLAN: 1. Chronic left-sided low back pain with left-sided sciatica -Patient not begun taking medications.  She will try the back brace that she just got.  If no improvement we can refer to orthopedics -Continue tramadol as needed.  Continue using icy hot and heating pad.  2. Prediabetes Continue healthy eating habits.  Encouraged to speak with her cardiologist about exercise.  I encouraged her to walk but always keep nitroglycerin with her - metFORMIN (GLUCOPHAGE) 500 MG tablet; Take 1 tablet (500 mg total) 2 (two) times daily by mouth.  Dispense: 60 tablet; Refill: 3 - Glucose (CBG) - HgB A1c  3. Coronary artery disease of autologous vein bypass graft with stable angina pectoris (HCC) Compliant with isosorbide, Plavix, Crestor, enalapril and amlodipine - CBC - Comprehensive metabolic panel - Lipid panel  4. Gastroesophageal reflux disease without esophagitis GERD precautions discussed.  She is not interested in giving up hot sauce  5. Influenza vaccination declined  6. Hyperlipidemia, unspecified hyperlipidemia type   7. Essential hypertension Well-controlled.  Patient was given the opportunity to ask  questions.  Patient verbalized understanding of the plan and was able to repeat key elements of the plan.   Orders Placed This Encounter  Procedures  . CBC  . Comprehensive metabolic panel  . Lipid panel  . Glucose (CBG)  . HgB A1c     Requested Prescriptions   Signed Prescriptions Disp Refills  . metFORMIN (GLUCOPHAGE) 500 MG tablet 60 tablet 3    Sig: Take 1 tablet (500 mg total) 2 (two) times daily by mouth.    Return in about 3 months (around 08/06/2017).  Jonah Blue, MD, FACP

## 2017-05-07 LAB — COMPREHENSIVE METABOLIC PANEL
A/G RATIO: 1.7 (ref 1.2–2.2)
ALBUMIN: 4.7 g/dL (ref 3.5–4.8)
ALT: 13 IU/L (ref 0–32)
AST: 13 IU/L (ref 0–40)
Alkaline Phosphatase: 73 IU/L (ref 39–117)
BILIRUBIN TOTAL: 0.8 mg/dL (ref 0.0–1.2)
BUN / CREAT RATIO: 18 (ref 12–28)
BUN: 10 mg/dL (ref 8–27)
CALCIUM: 9.5 mg/dL (ref 8.7–10.3)
CHLORIDE: 103 mmol/L (ref 96–106)
CO2: 26 mmol/L (ref 20–29)
Creatinine, Ser: 0.57 mg/dL (ref 0.57–1.00)
GFR, EST AFRICAN AMERICAN: 108 mL/min/{1.73_m2} (ref >59)
GFR, EST NON AFRICAN AMERICAN: 94 mL/min/{1.73_m2} (ref >59)
GLOBULIN, TOTAL: 2.7 g/dL (ref 1.5–4.5)
Glucose: 101 mg/dL — ABNORMAL HIGH (ref 65–99)
POTASSIUM: 4.1 mmol/L (ref 3.5–5.2)
SODIUM: 143 mmol/L (ref 134–144)
TOTAL PROTEIN: 7.4 g/dL (ref 6.0–8.5)

## 2017-05-07 LAB — CBC
HEMATOCRIT: 40.1 % (ref 34.0–46.6)
Hemoglobin: 13 g/dL (ref 11.1–15.9)
MCH: 28.1 pg (ref 26.6–33.0)
MCHC: 32.4 g/dL (ref 31.5–35.7)
MCV: 87 fL (ref 79–97)
PLATELETS: 239 10*3/uL (ref 150–379)
RBC: 4.63 x10E6/uL (ref 3.77–5.28)
RDW: 13.9 % (ref 12.3–15.4)
WBC: 7.4 10*3/uL (ref 3.4–10.8)

## 2017-05-07 LAB — LIPID PANEL
Chol/HDL Ratio: 3.1 ratio (ref 0.0–4.4)
Cholesterol, Total: 137 mg/dL (ref 100–199)
HDL: 44 mg/dL (ref >39)
LDL Calculated: 59 mg/dL (ref 0–99)
Triglycerides: 169 mg/dL — ABNORMAL HIGH (ref 0–149)
VLDL Cholesterol Cal: 34 mg/dL (ref 5–40)

## 2017-05-14 ENCOUNTER — Telehealth: Payer: Self-pay

## 2017-05-14 DIAGNOSIS — K219 Gastro-esophageal reflux disease without esophagitis: Secondary | ICD-10-CM | POA: Diagnosis not present

## 2017-05-14 DIAGNOSIS — Z0000 Encounter for general adult medical examination without abnormal findings: Secondary | ICD-10-CM | POA: Diagnosis not present

## 2017-05-14 DIAGNOSIS — E8770 Fluid overload, unspecified: Secondary | ICD-10-CM | POA: Diagnosis not present

## 2017-05-14 DIAGNOSIS — E785 Hyperlipidemia, unspecified: Secondary | ICD-10-CM | POA: Diagnosis not present

## 2017-05-14 DIAGNOSIS — M545 Low back pain: Secondary | ICD-10-CM | POA: Diagnosis not present

## 2017-05-14 DIAGNOSIS — I25119 Atherosclerotic heart disease of native coronary artery with unspecified angina pectoris: Secondary | ICD-10-CM | POA: Diagnosis not present

## 2017-05-14 DIAGNOSIS — I509 Heart failure, unspecified: Secondary | ICD-10-CM | POA: Diagnosis not present

## 2017-05-14 DIAGNOSIS — I110 Hypertensive heart disease with heart failure: Secondary | ICD-10-CM | POA: Diagnosis not present

## 2017-05-14 DIAGNOSIS — J4520 Mild intermittent asthma, uncomplicated: Secondary | ICD-10-CM | POA: Diagnosis not present

## 2017-05-14 DIAGNOSIS — M62838 Other muscle spasm: Secondary | ICD-10-CM | POA: Diagnosis not present

## 2017-05-14 NOTE — Telephone Encounter (Signed)
Contacted pt to go over lab results pt is aware and doesn't have any questions or concerns 

## 2017-05-16 ENCOUNTER — Other Ambulatory Visit: Payer: Self-pay | Admitting: Internal Medicine

## 2017-05-16 DIAGNOSIS — I10 Essential (primary) hypertension: Secondary | ICD-10-CM

## 2017-05-16 DIAGNOSIS — I252 Old myocardial infarction: Secondary | ICD-10-CM

## 2017-05-31 ENCOUNTER — Other Ambulatory Visit: Payer: Self-pay | Admitting: Internal Medicine

## 2017-05-31 DIAGNOSIS — I10 Essential (primary) hypertension: Secondary | ICD-10-CM

## 2017-05-31 DIAGNOSIS — I252 Old myocardial infarction: Secondary | ICD-10-CM

## 2017-06-03 ENCOUNTER — Other Ambulatory Visit: Payer: Self-pay | Admitting: Internal Medicine

## 2017-06-03 ENCOUNTER — Other Ambulatory Visit: Payer: Self-pay | Admitting: Pharmacist

## 2017-06-03 DIAGNOSIS — I252 Old myocardial infarction: Secondary | ICD-10-CM

## 2017-06-03 DIAGNOSIS — I2510 Atherosclerotic heart disease of native coronary artery without angina pectoris: Secondary | ICD-10-CM

## 2017-06-03 DIAGNOSIS — I10 Essential (primary) hypertension: Secondary | ICD-10-CM

## 2017-06-03 MED ORDER — METOPROLOL SUCCINATE ER 100 MG PO TB24: 100.0000 mg | ORAL_TABLET | Freq: Every day | ORAL | 0 refills | Status: DC

## 2017-06-03 MED ORDER — AMLODIPINE BESYLATE 5 MG PO TABS: 5.0000 mg | ORAL_TABLET | Freq: Every day | ORAL | 0 refills | Status: DC

## 2017-06-15 IMAGING — DX DG CHEST 2V
2 series · 2 of 2 positions shown · non-contrast
Comparison: None.

CLINICAL DATA: Cough starting 6 days ago.

EXAM:
CHEST  2 VIEW

[chest pa]
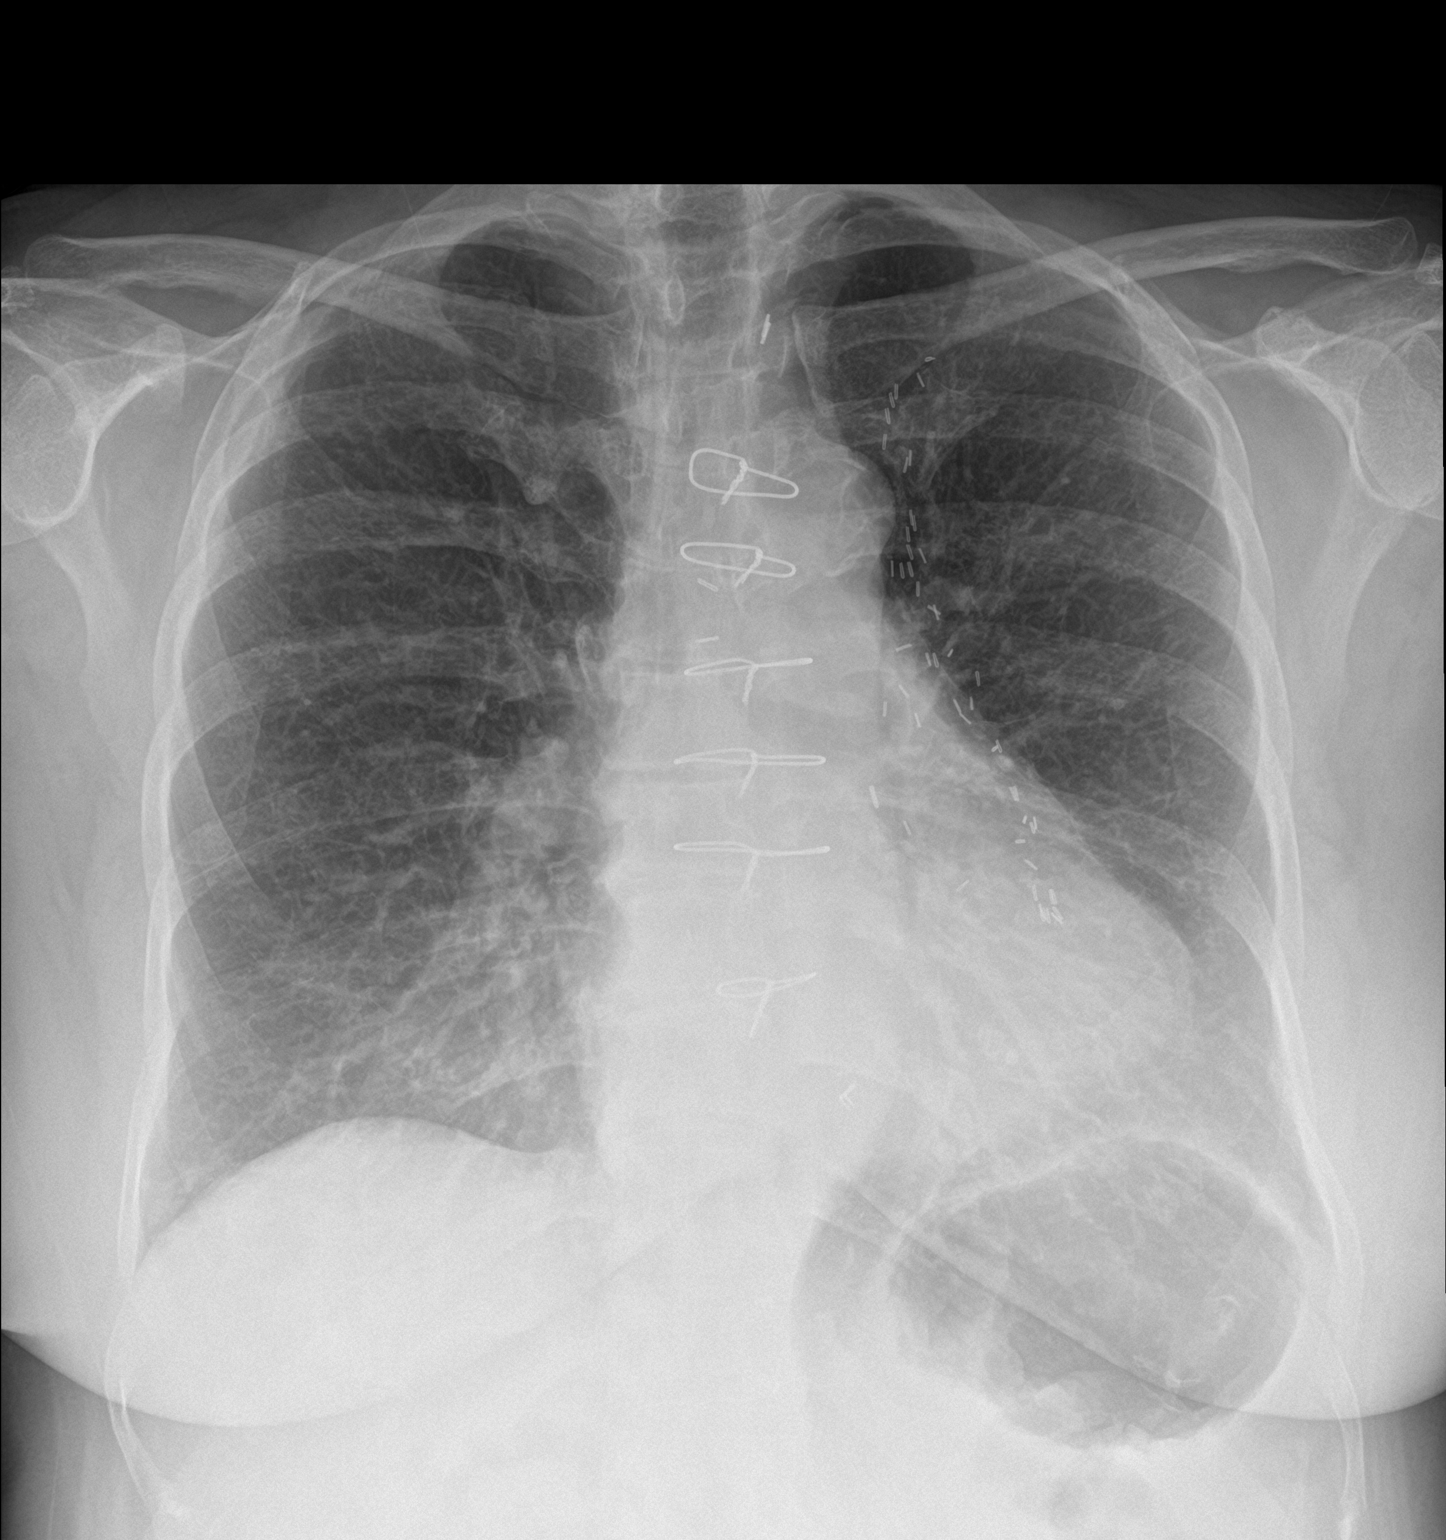

[chest lat]
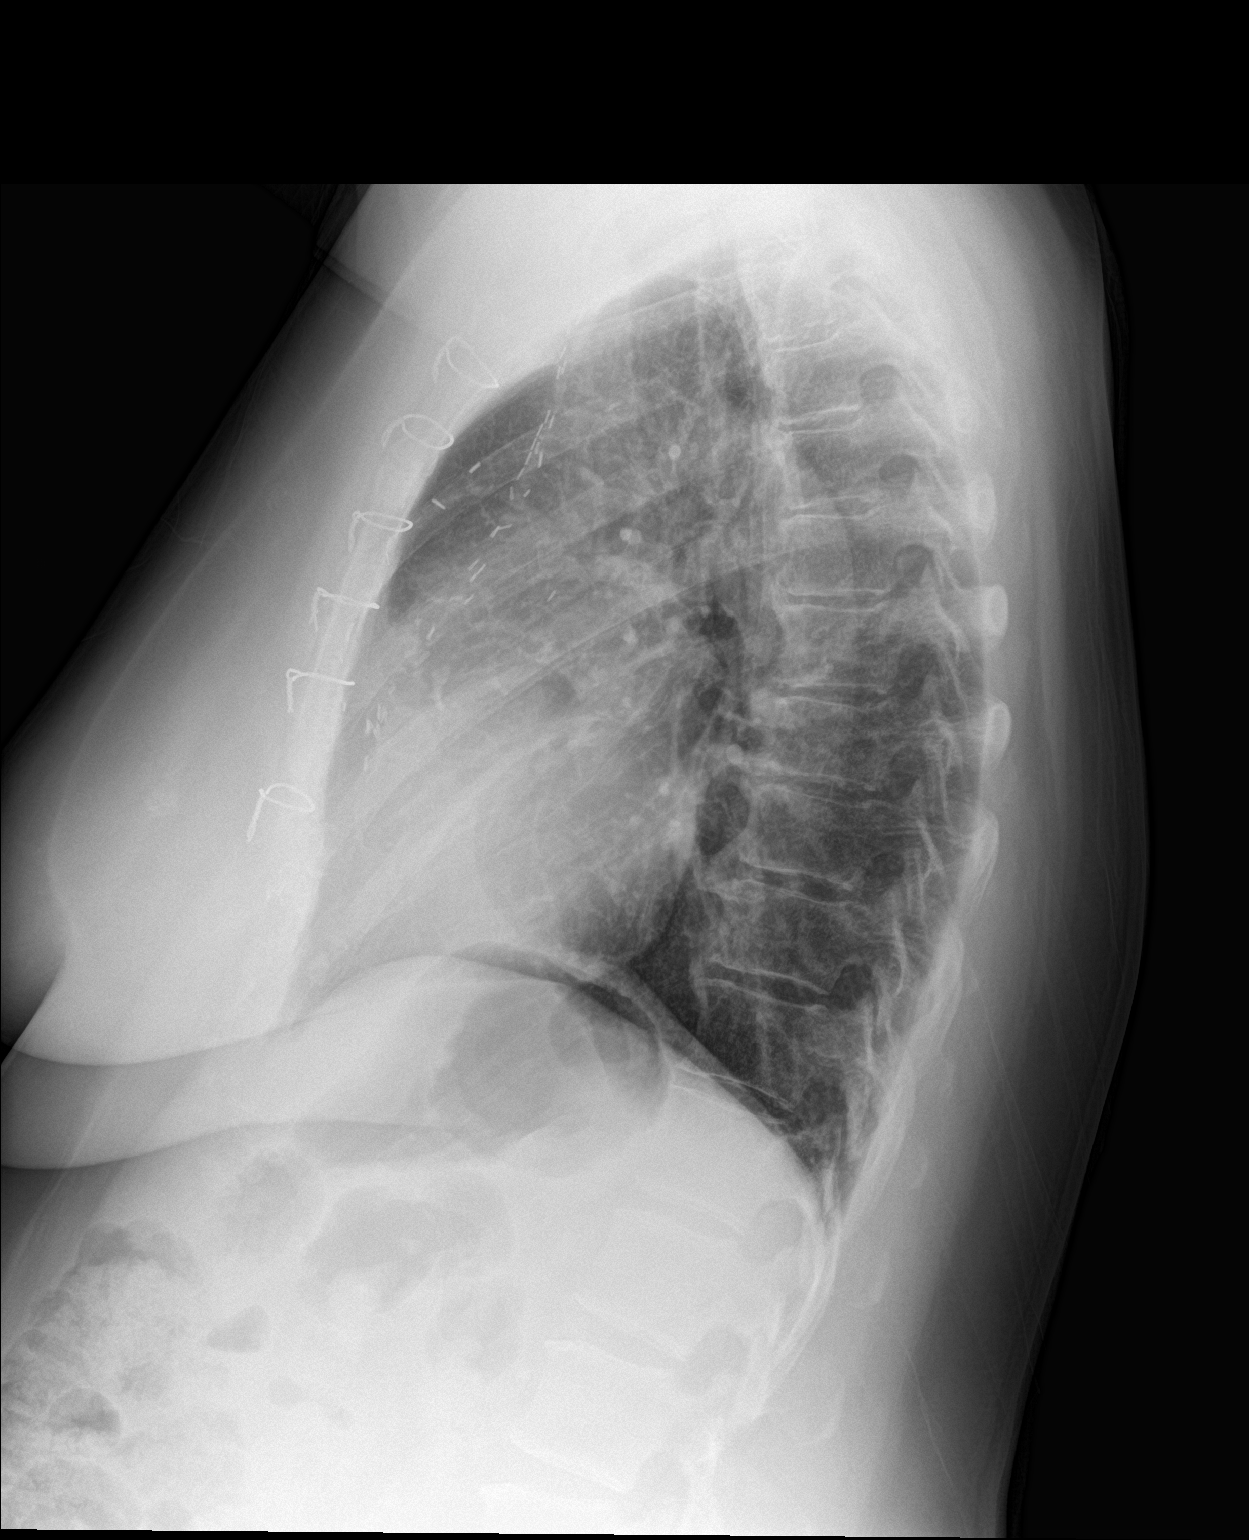

[2 of 2 positions shown; findings below may reference images not displayed]

FINDINGS: The heart size normal. Patient status post prior CABG and median
sternotomy. The mediastinal contour is normal. There is mild diffuse
increased pulmonary interstitium bilaterally. Degenerative joint
changes of the spine are noted.
IMPRESSION: Mild interstitial edema.

## 2017-08-06 ENCOUNTER — Encounter: Payer: Self-pay | Admitting: Internal Medicine

## 2017-08-06 ENCOUNTER — Ambulatory Visit: Payer: Medicare HMO | Attending: Internal Medicine | Admitting: Internal Medicine

## 2017-08-06 VITALS — BP 111/74 | HR 77 | Temp 97.9°F | Resp 16 | Wt 170.0 lb

## 2017-08-06 DIAGNOSIS — R7303 Prediabetes: Secondary | ICD-10-CM | POA: Insufficient documentation

## 2017-08-06 DIAGNOSIS — Z951 Presence of aortocoronary bypass graft: Secondary | ICD-10-CM | POA: Insufficient documentation

## 2017-08-06 DIAGNOSIS — Z79899 Other long term (current) drug therapy: Secondary | ICD-10-CM | POA: Diagnosis not present

## 2017-08-06 DIAGNOSIS — Z760 Encounter for issue of repeat prescription: Secondary | ICD-10-CM | POA: Diagnosis not present

## 2017-08-06 DIAGNOSIS — Z2882 Immunization not carried out because of caregiver refusal: Secondary | ICD-10-CM | POA: Diagnosis not present

## 2017-08-06 DIAGNOSIS — I25810 Atherosclerosis of coronary artery bypass graft(s) without angina pectoris: Secondary | ICD-10-CM | POA: Diagnosis not present

## 2017-08-06 DIAGNOSIS — Z7984 Long term (current) use of oral hypoglycemic drugs: Secondary | ICD-10-CM | POA: Insufficient documentation

## 2017-08-06 DIAGNOSIS — I10 Essential (primary) hypertension: Secondary | ICD-10-CM

## 2017-08-06 DIAGNOSIS — E785 Hyperlipidemia, unspecified: Secondary | ICD-10-CM | POA: Insufficient documentation

## 2017-08-06 DIAGNOSIS — Z9109 Other allergy status, other than to drugs and biological substances: Secondary | ICD-10-CM | POA: Diagnosis not present

## 2017-08-06 DIAGNOSIS — M7711 Lateral epicondylitis, right elbow: Secondary | ICD-10-CM

## 2017-08-06 DIAGNOSIS — Z7982 Long term (current) use of aspirin: Secondary | ICD-10-CM | POA: Diagnosis not present

## 2017-08-06 DIAGNOSIS — I252 Old myocardial infarction: Secondary | ICD-10-CM | POA: Insufficient documentation

## 2017-08-06 DIAGNOSIS — K219 Gastro-esophageal reflux disease without esophagitis: Secondary | ICD-10-CM | POA: Insufficient documentation

## 2017-08-06 DIAGNOSIS — Z2821 Immunization not carried out because of patient refusal: Secondary | ICD-10-CM | POA: Diagnosis not present

## 2017-08-06 DIAGNOSIS — Z888 Allergy status to other drugs, medicaments and biological substances status: Secondary | ICD-10-CM | POA: Insufficient documentation

## 2017-08-06 DIAGNOSIS — E119 Type 2 diabetes mellitus without complications: Secondary | ICD-10-CM | POA: Diagnosis present

## 2017-08-06 DIAGNOSIS — Z7902 Long term (current) use of antithrombotics/antiplatelets: Secondary | ICD-10-CM | POA: Insufficient documentation

## 2017-08-06 DIAGNOSIS — G4700 Insomnia, unspecified: Secondary | ICD-10-CM | POA: Insufficient documentation

## 2017-08-06 LAB — GLUCOSE, POCT (MANUAL RESULT ENTRY): POC Glucose: 132 mg/dl — AB (ref 70–99)

## 2017-08-06 LAB — POCT GLYCOSYLATED HEMOGLOBIN (HGB A1C): Hemoglobin A1C: 6.3

## 2017-08-06 MED ORDER — ALBUTEROL SULFATE HFA 108 (90 BASE) MCG/ACT IN AERS: 1.0000 | INHALATION_SPRAY | Freq: Four times a day (QID) | RESPIRATORY_TRACT | 0 refills | Status: DC | PRN

## 2017-08-06 MED ORDER — MONTELUKAST SODIUM 10 MG PO TABS: 10.0000 mg | ORAL_TABLET | Freq: Every day | ORAL | 5 refills | Status: DC

## 2017-08-06 NOTE — Progress Notes (Signed)
Pt is requesting a refill on Clotrimazole Betamethason Dipropionate Cream 1% 0.05%  Pt states she has some discomfort in her right elbow that when she squeezes her hand she feels pain

## 2017-08-06 NOTE — Progress Notes (Signed)
Patient ID: Sierra SkeensSandra Gronau, female    DOB: [birth year]  MRN: [id number]  CC: Diabetes and Hypertension   Subjective: [name] is a 72 y.o. female who presents for chronic ds management Her concerns today include:  Patient with history of CAD s/p CADG x 4 (MI fall 2017 for medical management, Dr.Kadakia), HTN, HLD, chronic LBP (LT sided sciatica on Naprosyn and  tramadol), GERD, / asthma, preDM  1. Pain RT elbow x 3 days.  Pain was 10/10.  Hurts to do any lifting -no initiating factors.   Use Tiger Bam and heating pad yesterday with good results.  Today pain is 3-4/10  2.  CAD:  Saw her cardiologist before christmas.  No change in meds.  No CP Not taking Furosemide because no LE edema  3. GERD:  Drinks a home-made tea that decreases acid reflux.  Hardly takes Nexium  4.   Pre-DM:  Tolerating Metformin.  Does well with eating habits  5.  Request RF on Singulair and Albuterol.  Spring time is worse for her breathing and allergy symptoms   HM:  Declines flu shot  Patient Active Problem List   Diagnosis Date Noted  . Prediabetes 05/06/2017  . Psoriasis of scalp 04/27/2016  . CAD (coronary atherosclerotic disease) 04/27/2016  . Chest pain at rest 04/19/2016    Class: Acute  . Lumbago with sciatica, left side 03/10/2016  . Eczematous dermatitis of eyelid 10/20/2015  . Hyperlipidemia 05/09/2015  . Environmental allergies 05/02/2015  . GERD (gastroesophageal reflux disease) 05/02/2015  . Insomnia 05/02/2015  . Arthralgia 05/02/2015  . Hypertension   . History of MI (myocardial infarction)      Current Outpatient Medications on File Prior to Visit  Medication Sig Dispense Refill  . amLODipine (NORVASC) 5 MG tablet Take 1 tablet (5 mg total) by mouth daily. 90 tablet 0  . aspirin 81 MG tablet Take 1 tablet (81 mg total) by mouth daily. 90 tablet 3  . calcium carbonate (OS-CAL) 1250 (500 Ca) MG chewable tablet Chew 1 tablet by mouth daily.    . clopidogrel (PLAVIX) 75  MG tablet Take 1 tablet (75 mg total) by mouth daily. 30 tablet 3  . diclofenac sodium (VOLTAREN) 1 % GEL Apply 2 g topically 4 (four) times daily as needed (R bicep pain). (Patient not taking: Reported on 05/06/2017) 100 g 5  . enalapril (VASOTEC) 20 MG tablet Take 1 tablet (20 mg total) by mouth daily. 90 tablet 0  . esomeprazole (NEXIUM) 20 MG packet Take 20 mg by mouth daily before breakfast.    . furosemide (LASIX) 20 MG tablet Take 1 tablet (20 mg total) by mouth daily. (Patient not taking: Reported on 08/07/2016) 90 tablet 3  . hydrocortisone valerate ointment (WEST-CORT) 0.2 % Apply 1 application topically 2 (two) times daily as needed. Eyelid dermatitis (Patient not taking: Reported on 08/06/2017) 45 g 1  . isosorbide mononitrate (IMDUR) 30 MG 24 hr tablet Take 1 tablet (30 mg total) by mouth daily. 30 tablet 3  . KRILL OIL PO Take 1 capsule by mouth daily.    . metFORMIN (GLUCOPHAGE) 500 MG tablet Take 1 tablet (500 mg total) 2 (two) times daily by mouth. 60 tablet 3  . metoprolol succinate (TOPROL-XL) 100 MG 24 hr tablet Take 1 tablet (100 mg total) by mouth daily. Take with or immediately following a meal. 90 tablet 0  . NITROSTAT 0.4 MG SL tablet Place 1 tablet (0.4 mg total) under the tongue every 5 (  five) minutes as needed for chest pain. Reported on 10/20/2015 25 tablet 2  . rosuvastatin (CRESTOR) 20 MG tablet Take 1 tablet (20 mg total) by mouth daily. 90 tablet 3  . traMADol (ULTRAM) 50 MG tablet TAKE ONE TABLET BY MOUTH every twelve HOURS AS NEEDED FOR SEVERE pain 60 tablet 2   No current facility-administered medications on file prior to visit.     Allergies  Allergen Reactions  . Statins Other (See Comments)    Muscle aches     Social History   Socioeconomic History  . Marital status: Divorced    Spouse name: Not on file  . Number of children: Not on file  . Years of education: Not on file  . Highest education level: Not on file  Social Needs  . Financial resource  strain: Not on file  . Food insecurity - worry: Not on file  . Food insecurity - inability: Not on file  . Transportation needs - medical: Not on file  . Transportation needs - non-medical: Not on file  Occupational History  . Occupation: retired   Tobacco Use  . Smoking status: Never Smoker  . Smokeless tobacco: Never Used  Substance and Sexual Activity  . Alcohol use: No    Alcohol/week: 0.0 oz  . Drug use: No  . Sexual activity: No  Other Topics Concern  . Not on file  Social History Narrative  . Not on file    Family History  Problem Relation Age of Onset  . Heart disease Mother   . Heart disease Father   . Heart disease Brother     Past Surgical History:  Procedure Laterality Date  . CARDIAC CATHETERIZATION  04/20/2016  . CARDIAC CATHETERIZATION N/A 04/20/2016   Procedure: Left Heart Cath and Cors/Grafts Angiography;  Surgeon: Orpah Cobb, MD;  Location: [facility];  Service: Cardiovascular;  Laterality: N/A;  . CORONARY ARTERY BYPASS GRAFT  2002   Quadruple Bypass in [place]    ROS: Review of Systems Negative except as stated above PHYSICAL EXAM: BP 111/74   Pulse 77   Temp 97.9 F (36.6 C) (Oral)   Resp 16   Wt 170 lb (77.1 kg)   SpO2 96%   BMI 28.29 kg/m   Physical Exam  General appearance - alert, well appearing, and in no distress Mental status - alert, oriented to person, place, and time, normal mood, behavior, speech, dress, motor activity, and thought processes Neck - supple, no significant adenopathy Chest - clear to auscultation, no wheezes, rales or rhonchi, symmetric air entry Heart - normal rate, regular rhythm, normal S1, S2, no murmurs, rubs, clicks or gallops Musculoskeletal -right elbow: No edema or erythema.  Mild tenderness on palpation of lateral epicondyles.  Mild discomfort with passive range of motion Extremities - peripheral pulses normal, no pedal edema, no clubbing or cyanosis    Results for orders placed or  performed in visit on 08/06/17  POCT glucose (manual entry)  Result Value Ref Range   POC Glucose 132 (A) 70 - 99 mg/dl  POCT glycosylated hemoglobin (Hb A1C)  Result Value Ref Range   Hemoglobin A1C 6.3     ASSESSMENT AND PLAN: 1. Prediabetes Continue metformin. Continue to encourage healthy eating habits.  Try to get in some exercise several times a week - POCT glucose (manual entry) - POCT glycosylated hemoglobin (Hb A1C)  2. Lateral epicondylitis of right elbow Continue use of over-the-counter pain rub and heating pad  3. Environmental allergies -  montelukast (SINGULAIR) 10 MG tablet; Take 1 tablet (10 mg total) by mouth daily.  Dispense: 30 tablet; Refill: 5  4. Essential hypertension At goal.  Continue current medications.  5. Coronary artery disease involving autologous vein coronary bypass graft without angina pectoris Stable.  Continue Crestor, enalapril, isosorbide, Plavix, aspirin and metoprolol  6. Gastroesophageal reflux disease without esophagitis -Well-controlled with home remedy  7. Influenza vaccination declined   Patient was given the opportunity to ask questions.  Patient verbalized understanding of the plan and was able to repeat key elements of the plan.   Orders Placed This Encounter  Procedures  . POCT glucose (manual entry)  . POCT glycosylated hemoglobin (Hb A1C)     Requested Prescriptions   Signed Prescriptions Disp Refills  . montelukast (SINGULAIR) 10 MG tablet 30 tablet 5    Sig: Take 1 tablet (10 mg total) by mouth daily.  Marland Kitchen albuterol (PROVENTIL HFA;VENTOLIN HFA) 108 (90 Base) MCG/ACT inhaler 1 Inhaler 0    Sig: Inhale 1-2 puffs into the lungs every 6 (six) hours as needed for wheezing or shortness of breath.    Return in about 4 months (around 12/04/2017).  Jonah Blue, MD, FACP

## 2017-08-06 NOTE — Patient Instructions (Signed)
Tennis Elbow Tennis elbow is puffiness (inflammation) of the outer tendons of your forearm close to your elbow. Your tendons attach your muscles to your bones. Tennis elbow can happen in any sport or job in which you use your elbow too much. It is caused by doing the same motion over and over. Tennis elbow can cause:  Pain and tenderness in your forearm and the outer part of your elbow.  A burning feeling. This runs from your elbow through your arm.  Weak grip in your hands.  Follow these instructions at home: Activity  Rest your elbow and wrist as told by your doctor. Try to avoid any activities that caused the problem until your doctor says that you can do them again.  If a physical therapist teaches you exercises, do all of them as told.  If you lift an object, lift it with your palm facing up. This is easier on your elbow. Lifestyle  If your tennis elbow is caused by sports, check your equipment and make sure that: ? You are using it correctly. ? It fits you well.  If your tennis elbow is caused by work, take breaks often, if you are able. Talk with your manager about doing your work in a way that is safe for you. ? If your tennis elbow is caused by computer use, talk with your manager about any changes that can be made to your work setup. General instructions  If told, apply ice to the painful area: ? Put ice in a plastic bag. ? Place a towel between your skin and the bag. ? Leave the ice on for 20 minutes, 2-3 times per day.  Take medicines only as told by your doctor.  If you were given a brace, wear it as told by your doctor.  Keep all follow-up visits as told by your doctor. This is important. Contact a doctor if:  Your pain does not get better with treatment.  Your pain gets worse.  You have weakness in your forearm, hand, or fingers.  You cannot feel your forearm, hand, or fingers. This information is not intended to replace advice given to you by your health  care provider. Make sure you discuss any questions you have with your health care provider. Document Released: 12/06/2009 Document Revised: 02/16/2016 Document Reviewed: 06/14/2014 Elsevier Interactive Patient Education  2018 [employer] Inc.  

## 2017-08-16 IMAGING — DX DG CHEST 2V
2 series · 2 of 2 positions shown · non-contrast
Comparison: 04/19/2016

CLINICAL DATA: Chest pressure beginning tonight. Radiation to the
left shoulder. History of hypertension.

EXAM:
CHEST  2 VIEW

[chest pa]
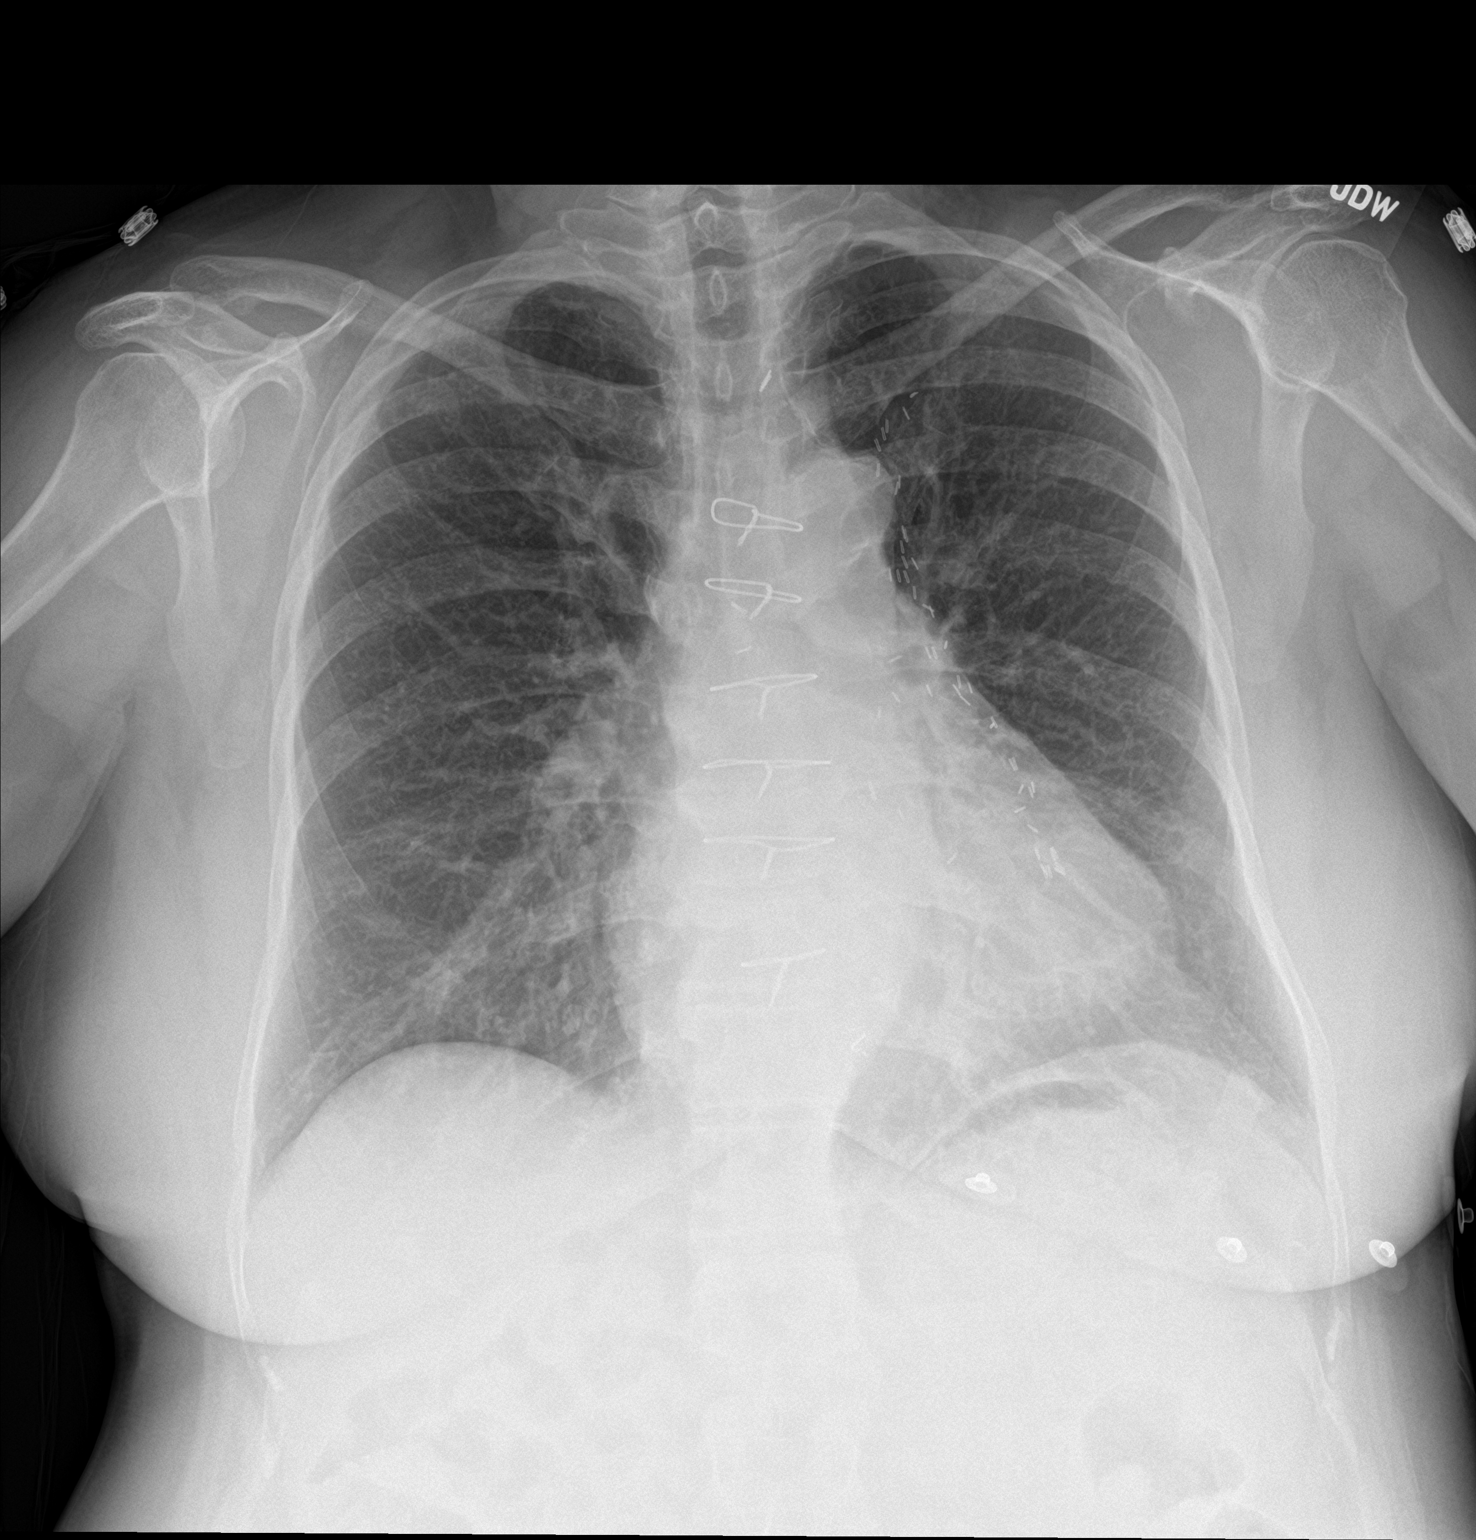

[chest lat]
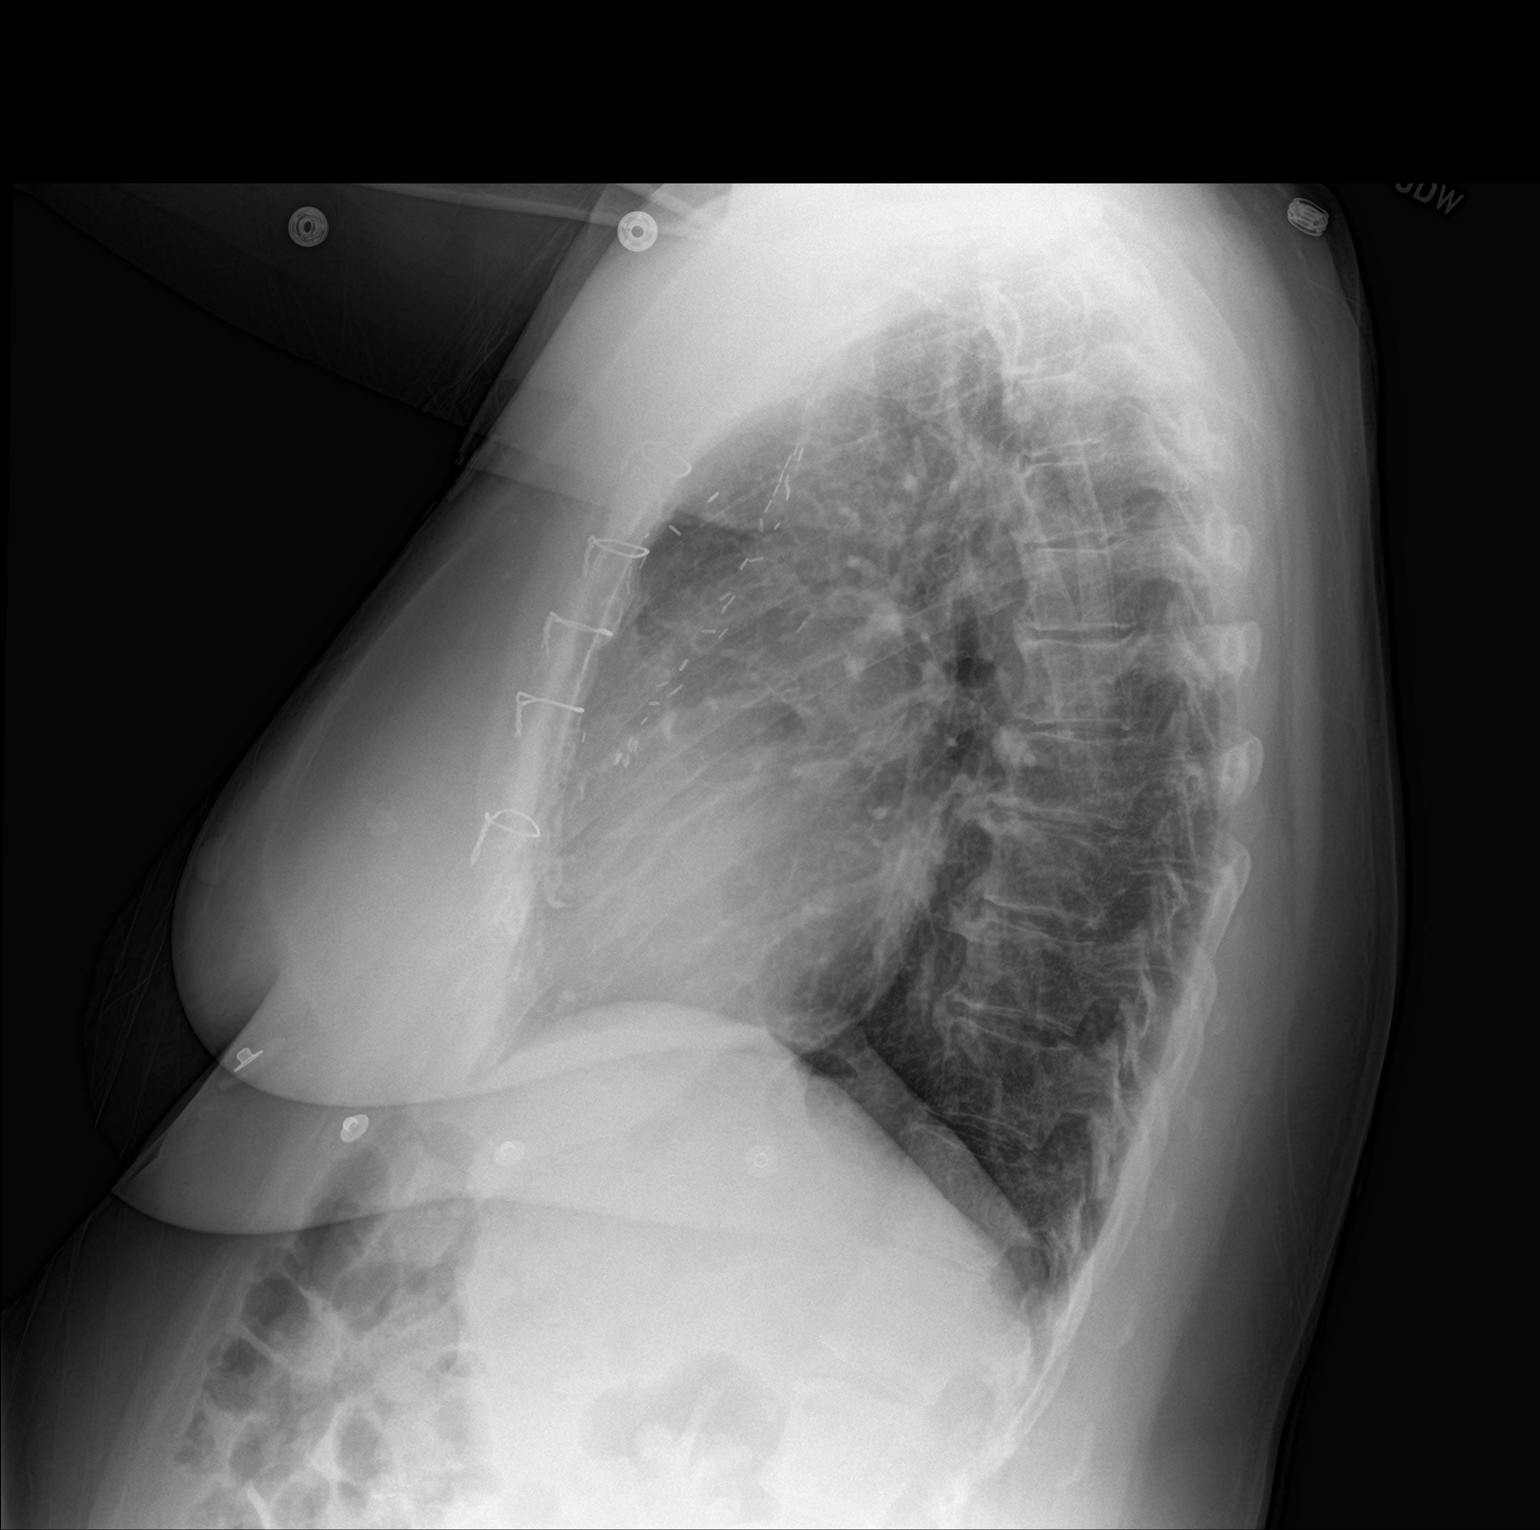

[2 of 2 positions shown; findings below may reference images not displayed]

FINDINGS: Postoperative changes in the mediastinum. Borderline heart size with
normal pulmonary vascularity. No focal airspace disease or
consolidation in the lungs. No blunting of costophrenic angles. No
pneumothorax. Calcified and tortuous aorta. Degenerative changes in
the spine.
IMPRESSION: Borderline cardiac enlargement. No evidence of active pulmonary
disease.

## 2017-09-23 ENCOUNTER — Other Ambulatory Visit: Payer: Self-pay | Admitting: Internal Medicine

## 2017-09-23 ENCOUNTER — Telehealth: Payer: Self-pay | Admitting: Internal Medicine

## 2017-09-23 DIAGNOSIS — R7303 Prediabetes: Secondary | ICD-10-CM

## 2017-09-23 NOTE — Telephone Encounter (Signed)
Dr. Algie CofferKadakia is refilling her Tramadol. This medication has not been filled by Dr. Laural BenesJohnson. Denied

## 2017-09-23 NOTE — Telephone Encounter (Signed)
Pt called to request a refill on  -traMADol (ULTRAM) 50 MG tablet  Due to her lower body pain. If approved please send to -[facility] [address] Please follow up

## 2017-09-25 ENCOUNTER — Other Ambulatory Visit: Payer: Self-pay | Admitting: Internal Medicine

## 2017-09-25 DIAGNOSIS — G8929 Other chronic pain: Secondary | ICD-10-CM

## 2017-09-25 DIAGNOSIS — M5442 Lumbago with sciatica, left side: Principal | ICD-10-CM

## 2017-10-09 ENCOUNTER — Other Ambulatory Visit: Payer: Self-pay | Admitting: Internal Medicine

## 2017-10-09 DIAGNOSIS — I2510 Atherosclerotic heart disease of native coronary artery without angina pectoris: Secondary | ICD-10-CM

## 2017-10-09 DIAGNOSIS — I10 Essential (primary) hypertension: Secondary | ICD-10-CM

## 2017-12-10 ENCOUNTER — Encounter: Payer: Self-pay | Admitting: Internal Medicine

## 2017-12-10 ENCOUNTER — Ambulatory Visit: Payer: Medicare HMO | Attending: Internal Medicine | Admitting: Internal Medicine

## 2017-12-10 VITALS — BP 108/67 | HR 67 | Temp 98.0°F | Resp 16 | Wt 170.2 lb

## 2017-12-10 DIAGNOSIS — I25810 Atherosclerosis of coronary artery bypass graft(s) without angina pectoris: Secondary | ICD-10-CM | POA: Diagnosis not present

## 2017-12-10 DIAGNOSIS — Z888 Allergy status to other drugs, medicaments and biological substances status: Secondary | ICD-10-CM | POA: Diagnosis not present

## 2017-12-10 DIAGNOSIS — Z9889 Other specified postprocedural states: Secondary | ICD-10-CM | POA: Diagnosis not present

## 2017-12-10 DIAGNOSIS — K219 Gastro-esophageal reflux disease without esophagitis: Secondary | ICD-10-CM | POA: Insufficient documentation

## 2017-12-10 DIAGNOSIS — G8929 Other chronic pain: Secondary | ICD-10-CM | POA: Diagnosis not present

## 2017-12-10 DIAGNOSIS — Z7982 Long term (current) use of aspirin: Secondary | ICD-10-CM | POA: Insufficient documentation

## 2017-12-10 DIAGNOSIS — Z8249 Family history of ischemic heart disease and other diseases of the circulatory system: Secondary | ICD-10-CM | POA: Insufficient documentation

## 2017-12-10 DIAGNOSIS — J45909 Unspecified asthma, uncomplicated: Secondary | ICD-10-CM | POA: Diagnosis not present

## 2017-12-10 DIAGNOSIS — I252 Old myocardial infarction: Secondary | ICD-10-CM | POA: Insufficient documentation

## 2017-12-10 DIAGNOSIS — R7303 Prediabetes: Secondary | ICD-10-CM

## 2017-12-10 DIAGNOSIS — Z7984 Long term (current) use of oral hypoglycemic drugs: Secondary | ICD-10-CM | POA: Diagnosis not present

## 2017-12-10 DIAGNOSIS — Z79899 Other long term (current) drug therapy: Secondary | ICD-10-CM | POA: Insufficient documentation

## 2017-12-10 DIAGNOSIS — E785 Hyperlipidemia, unspecified: Secondary | ICD-10-CM | POA: Insufficient documentation

## 2017-12-10 DIAGNOSIS — Z7902 Long term (current) use of antithrombotics/antiplatelets: Secondary | ICD-10-CM | POA: Insufficient documentation

## 2017-12-10 DIAGNOSIS — I10 Essential (primary) hypertension: Secondary | ICD-10-CM | POA: Insufficient documentation

## 2017-12-10 DIAGNOSIS — E119 Type 2 diabetes mellitus without complications: Secondary | ICD-10-CM | POA: Insufficient documentation

## 2017-12-10 LAB — POCT GLYCOSYLATED HEMOGLOBIN (HGB A1C): HBA1C, POC (PREDIABETIC RANGE): 6.1 % (ref 5.7–6.4)

## 2017-12-10 LAB — GLUCOSE, POCT (MANUAL RESULT ENTRY): POC GLUCOSE: 149 mg/dL — AB (ref 70–99)

## 2017-12-10 MED ORDER — CLOTRIMAZOLE-BETAMETHASONE 1-0.05 % EX CREA: TOPICAL_CREAM | CUTANEOUS | 1 refills | Status: DC

## 2017-12-10 NOTE — Progress Notes (Signed)
Patient ID: [name], female    DOB: 1946/04/02  MRN: [id number]  CC: Diabetes and Hypertension   Subjective: [name] is a 72 y.o. female who presents for chronic ds management Her concerns today include:  Patient with history of CAD s/p CADG x 4 (MI fall 2058for medical management, Dr.Kadakia), HTN, HLD, chronic LBP (LT sided sciaticaon Naprosyn andtramadol), GERD, / asthma, preDM  CAD:  She was having some intermittent burning CP.  Saw her cardiologist on Tuesday.  He increase Imdur to 60 mg daily.  Norvasc changed to Diltiazam  GERD:  Has flare if she eats fried.  Doing Okay on Nexium  Pre-DM:  Compliant with Metformin.  Does well with eating habits.   Requesting RF on Clotriamazole-betamethasone cream 1%/0.05% which she uses as when she gets rash on buttock area.  Also sometimes gets painful crack in skin b/w buttock cheeks.  This helps but she is out of it.  Currently does not have rash now but likes to keep some on hand to use when needed.   Patient Active Problem List   Diagnosis Date Noted  . Prediabetes 05/06/2017  . Psoriasis of scalp 04/27/2016  . CAD (coronary atherosclerotic disease) 04/27/2016  . Chest pain at rest 04/19/2016    Class: Acute  . Lumbago with sciatica, left side 03/10/2016  . Eczematous dermatitis of eyelid 10/20/2015  . Hyperlipidemia 05/09/2015  . Environmental allergies 05/02/2015  . GERD (gastroesophageal reflux disease) 05/02/2015  . Insomnia 05/02/2015  . Arthralgia 05/02/2015  . Hypertension   . History of MI (myocardial infarction)      Current Outpatient Medications on File Prior to Visit  Medication Sig Dispense Refill  . albuterol (PROVENTIL HFA;VENTOLIN HFA) 108 (90 Base) MCG/ACT inhaler Inhale 1-2 puffs into the lungs every 6 (six) hours as needed for wheezing or shortness of breath. 1 Inhaler 0  . aspirin 81 MG tablet Take 1 tablet (81 mg total) by mouth daily. 90 tablet 3  . calcium carbonate (OS-CAL) 1250  (500 Ca) MG chewable tablet Chew 1 tablet by mouth daily.    . clopidogrel (PLAVIX) 75 MG tablet Take 1 tablet (75 mg total) by mouth daily. 30 tablet 3  . diclofenac sodium (VOLTAREN) 1 % GEL Apply 2 g topically 4 (four) times daily as needed (R bicep pain). (Patient not taking: Reported on 05/06/2017) 100 g 5  . enalapril (VASOTEC) 20 MG tablet Take 1 tablet (20 mg total) by mouth daily. 90 tablet 0  . esomeprazole (NEXIUM) 20 MG packet Take 20 mg by mouth daily before breakfast.    . hydrocortisone valerate ointment (WEST-CORT) 0.2 % Apply 1 application topically 2 (two) times daily as needed. Eyelid dermatitis (Patient not taking: Reported on 08/06/2017) 45 g 1  . isosorbide mononitrate (IMDUR) 60 MG 24 hr tablet Take 60 mg by mouth 2 (two) times daily.  3  . KRILL OIL PO Take 1 capsule by mouth daily.    . metFORMIN (GLUCOPHAGE) 500 MG tablet Take 1 tablet (500 mg total) 2 (two) times daily by mouth. 60 tablet 3  . metoprolol succinate (TOPROL-XL) 100 MG 24 hr tablet Take 1 tablet (100 mg total) by mouth daily. Take with or immediately following a meal. 90 tablet 0  . montelukast (SINGULAIR) 10 MG tablet Take 1 tablet (10 mg total) by mouth daily. 30 tablet 5  . NITROSTAT 0.4 MG SL tablet Place 1 tablet (0.4 mg total) under the tongue every 5 (five) minutes as needed for  chest pain. Reported on 10/20/2015 25 tablet 2  . rosuvastatin (CRESTOR) 20 MG tablet Take 1 tablet (20 mg total) by mouth daily. 90 tablet 3  . traMADol (ULTRAM) 50 MG tablet TAKE ONE TABLET BY MOUTH every twelve HOURS AS NEEDED FOR SEVERE pain 60 tablet 2   No current facility-administered medications on file prior to visit.     Allergies  Allergen Reactions  . Statins Other (See Comments)    Muscle aches     Social History   Socioeconomic History  . Marital status: Divorced    Spouse name: Not on file  . Number of children: Not on file  . Years of education: Not on file  . Highest education level: Not on file    Occupational History  . Occupation: retired   Engineer, production  . Financial resource strain: Not on file  . Food insecurity:    Worry: Not on file    Inability: Not on file  . Transportation needs:    Medical: Not on file    Non-medical: Not on file  Tobacco Use  . Smoking status: Never Smoker  . Smokeless tobacco: Never Used  Substance and Sexual Activity  . Alcohol use: No    Alcohol/week: 0.0 oz  . Drug use: No  . Sexual activity: Never  Lifestyle  . Physical activity:    Days per week: Not on file    Minutes per session: Not on file  . Stress: Not on file  Relationships  . Social connections:    Talks on phone: Not on file    Gets together: Not on file    Attends religious service: Not on file    Active member of club or organization: Not on file    Attends meetings of clubs or organizations: Not on file    Relationship status: Not on file  . Intimate partner violence:    Fear of current or ex partner: Not on file    Emotionally abused: Not on file    Physically abused: Not on file    Forced sexual activity: Not on file  Other Topics Concern  . Not on file  Social History Narrative  . Not on file    Family History  Problem Relation Age of Onset  . Heart disease Mother   . Heart disease Father   . Heart disease Brother     Past Surgical History:  Procedure Laterality Date  . CARDIAC CATHETERIZATION  04/20/2016  . CARDIAC CATHETERIZATION N/A 04/20/2016   Procedure: Left Heart Cath and Cors/Grafts Angiography;  Surgeon: Orpah Cobb, MD;  Location: [facility];  Service: Cardiovascular;  Laterality: N/A;  . CORONARY ARTERY BYPASS GRAFT  2002   Quadruple Bypass in [place]    ROS: Review of Systems Negative except as stated above PHYSICAL EXAM: BP 108/67   Pulse 67   Temp 98 F (36.7 C) (Oral)   Resp 16   Wt 170 lb 3.2 oz (77.2 kg)   SpO2 97%   BMI 28.32 kg/m   Physical Exam  General appearance - alert, well appearing, and in no  distress Mental status - normal mood, behavior, speech, dress, motor activity, and thought processes Neck - supple, no significant adenopathy Chest - clear to auscultation, no wheezes, rales or rhonchi, symmetric air entry Heart - normal rate, regular rhythm, normal S1, S2, no murmurs, rubs, clicks or gallops Extremities - peripheral pulses normal, no pedal edema, no clubbing or cyanosis Skin: No rash at  this time Results for orders placed or performed in visit on 12/10/17  POCT glucose (manual entry)  Result Value Ref Range   POC Glucose 149 (A) 70 - 99 mg/dl  POCT glycosylated hemoglobin (Hb A1C)  Result Value Ref Range   Hemoglobin A1C  4.0 - 5.6 %   HbA1c, POC (prediabetic range) 6.1 5.7 - 6.4 %   HbA1c, POC (controlled diabetic range)  0.0 - 7.0 %    ASSESSMENT AND PLAN:  1. Prediabetes Continue to encourage healthy eating habits - POCT glucose (manual entry) - POCT glycosylated hemoglobin (Hb A1C)  2. Essential hypertension Well-controlled continue current medications  3. Coronary artery disease involving autologous vein coronary bypass graft without angina pectoris Doing better with increased dose of isosorbide per her cardiologist.  4. Gastroesophageal reflux disease without esophagitis Continue to avoid foods that cause flareup.  Continue Nexium.  Prescription sent to her pharmacy for clotrimazole betamethasone cream to use as needed when she has flareup of rash on the buttock.   Patient was given the opportunity to ask questions.  Patient verbalized understanding of the plan and was able to repeat key elements of the plan.   Orders Placed This Encounter  Procedures  . POCT glucose (manual entry)  . POCT glycosylated hemoglobin (Hb A1C)     Requested Prescriptions   Signed Prescriptions Disp Refills  . clotrimazole-betamethasone (LOTRISONE) cream 30 g 1    Sig: Apply to affected area BID    Return in about 4 months (around 04/11/2018).  Jonah Blueeborah Johnson,  MD, FACP

## 2017-12-12 DIAGNOSIS — K219 Gastro-esophageal reflux disease without esophagitis: Secondary | ICD-10-CM | POA: Diagnosis not present

## 2017-12-12 DIAGNOSIS — I10 Essential (primary) hypertension: Secondary | ICD-10-CM | POA: Diagnosis not present

## 2017-12-12 DIAGNOSIS — E785 Hyperlipidemia, unspecified: Secondary | ICD-10-CM | POA: Diagnosis not present

## 2017-12-12 DIAGNOSIS — I2510 Atherosclerotic heart disease of native coronary artery without angina pectoris: Secondary | ICD-10-CM | POA: Diagnosis not present

## 2017-12-12 DIAGNOSIS — G8929 Other chronic pain: Secondary | ICD-10-CM | POA: Diagnosis not present

## 2017-12-12 DIAGNOSIS — I252 Old myocardial infarction: Secondary | ICD-10-CM | POA: Diagnosis not present

## 2017-12-12 DIAGNOSIS — Z7902 Long term (current) use of antithrombotics/antiplatelets: Secondary | ICD-10-CM | POA: Diagnosis not present

## 2017-12-12 DIAGNOSIS — Z7982 Long term (current) use of aspirin: Secondary | ICD-10-CM | POA: Diagnosis not present

## 2017-12-12 DIAGNOSIS — E119 Type 2 diabetes mellitus without complications: Secondary | ICD-10-CM | POA: Diagnosis not present

## 2017-12-12 DIAGNOSIS — J309 Allergic rhinitis, unspecified: Secondary | ICD-10-CM | POA: Diagnosis not present

## 2018-01-10 ENCOUNTER — Other Ambulatory Visit: Payer: Self-pay | Admitting: Internal Medicine

## 2018-03-02 DIAGNOSIS — I10 Essential (primary) hypertension: Secondary | ICD-10-CM | POA: Diagnosis not present

## 2018-03-02 DIAGNOSIS — Z951 Presence of aortocoronary bypass graft: Secondary | ICD-10-CM | POA: Diagnosis not present

## 2018-03-02 DIAGNOSIS — I2510 Atherosclerotic heart disease of native coronary artery without angina pectoris: Secondary | ICD-10-CM | POA: Diagnosis not present

## 2018-03-02 DIAGNOSIS — R072 Precordial pain: Secondary | ICD-10-CM | POA: Diagnosis not present

## 2018-03-13 ENCOUNTER — Ambulatory Visit: Payer: Medicare HMO | Attending: Internal Medicine | Admitting: Internal Medicine

## 2018-03-13 ENCOUNTER — Encounter: Payer: Self-pay | Admitting: Internal Medicine

## 2018-03-13 VITALS — BP 123/73 | HR 67 | Temp 97.9°F | Resp 16 | Wt 174.0 lb

## 2018-03-13 DIAGNOSIS — K219 Gastro-esophageal reflux disease without esophagitis: Secondary | ICD-10-CM

## 2018-03-13 DIAGNOSIS — M5442 Lumbago with sciatica, left side: Secondary | ICD-10-CM | POA: Diagnosis not present

## 2018-03-13 DIAGNOSIS — I252 Old myocardial infarction: Secondary | ICD-10-CM | POA: Insufficient documentation

## 2018-03-13 DIAGNOSIS — Z9109 Other allergy status, other than to drugs and biological substances: Secondary | ICD-10-CM

## 2018-03-13 DIAGNOSIS — G8929 Other chronic pain: Secondary | ICD-10-CM | POA: Insufficient documentation

## 2018-03-13 DIAGNOSIS — I10 Essential (primary) hypertension: Secondary | ICD-10-CM | POA: Diagnosis not present

## 2018-03-13 DIAGNOSIS — Z951 Presence of aortocoronary bypass graft: Secondary | ICD-10-CM | POA: Insufficient documentation

## 2018-03-13 DIAGNOSIS — I25718 Atherosclerosis of autologous vein coronary artery bypass graft(s) with other forms of angina pectoris: Secondary | ICD-10-CM

## 2018-03-13 DIAGNOSIS — Z2821 Immunization not carried out because of patient refusal: Secondary | ICD-10-CM | POA: Diagnosis not present

## 2018-03-13 DIAGNOSIS — I2510 Atherosclerotic heart disease of native coronary artery without angina pectoris: Secondary | ICD-10-CM | POA: Insufficient documentation

## 2018-03-13 DIAGNOSIS — R7303 Prediabetes: Secondary | ICD-10-CM | POA: Insufficient documentation

## 2018-03-13 DIAGNOSIS — E785 Hyperlipidemia, unspecified: Secondary | ICD-10-CM | POA: Diagnosis not present

## 2018-03-13 LAB — POCT GLYCOSYLATED HEMOGLOBIN (HGB A1C): HEMOGLOBIN A1C: 6.1 % (ref 4.0–5.6)

## 2018-03-13 LAB — GLUCOSE, POCT (MANUAL RESULT ENTRY): POC Glucose: 117 mg/dl — AB (ref 70–99)

## 2018-03-13 MED ORDER — TRAMADOL HCL 50 MG PO TABS: 50.0000 mg | ORAL_TABLET | Freq: Two times a day (BID) | ORAL | 0 refills | Status: DC | PRN

## 2018-03-13 MED ORDER — RANITIDINE HCL 150 MG PO TABS: 150.0000 mg | ORAL_TABLET | Freq: Two times a day (BID) | ORAL | 3 refills | Status: DC

## 2018-03-13 MED ORDER — TRAMADOL HCL 50 MG PO TABS: ORAL_TABLET | ORAL | 0 refills | Status: DC

## 2018-03-13 MED ORDER — CLOTRIMAZOLE-BETAMETHASONE 1-0.05 % EX CREA: TOPICAL_CREAM | CUTANEOUS | 2 refills | Status: DC

## 2018-03-13 MED ORDER — MONTELUKAST SODIUM 10 MG PO TABS: 10.0000 mg | ORAL_TABLET | Freq: Every day | ORAL | 2 refills | Status: DC

## 2018-03-13 NOTE — Progress Notes (Signed)
Pt states she has been feeling tire lately  Pt states when she takes the Singulair it does make her tired

## 2018-03-13 NOTE — Progress Notes (Addendum)
Patient ID: [name], female    DOB: 1946-05-29  MRN: [id number]  CC: Diabetes (pre-diabetes) and Hypertension   Subjective: [name] is a 72 y.o. female who presents for chronic ds management.  Last seen in June of this year.  Spouse is with her. Her concerns today include:  Patient with history of CAD s/p CADG x 4 (MI fall 2033for medical management, Dr.Kadakia), HTN, HLD, chronic LBP (LT sided sciaticaon Naprosyn andtramadol), GERD, / asthma, preDM.  CAD:  Saw Dr. Algie Coffer yesterday.  Reports symptoms of chronic stable angina.  Chest pains only with strenuous activity.  No changes made in medications.  She requests that results of any blood work that is done today be faxed to her cardiologist.  His office phone number is [phone] and fax number is [phone]  GERD: She had taken some of her nieces ranitidine and feels that this works better for her than Nexium.  Her husband states that she still tries to eat spicy foods at times and ends up paying for it later.  Vit B12 def: Gives history of vitamin B12 deficiency.  She gets B12 shots at her cardiologist office.  She states that it is cheaper for her failure.    Request RF on Lotrisone cream which she uses intermittently when she gets a rash.  Chronic LBP: This is stable.  She gets flareup if she tries to do too much.  Low dull ache.  She uses tramadol sparingly.  Requesting refill.  No significant side effects from the medication.    Patient Active Problem List   Diagnosis Date Noted  . Prediabetes 05/06/2017  . Psoriasis of scalp 04/27/2016  . CAD (coronary atherosclerotic disease) 04/27/2016  . Chest pain at rest 04/19/2016    Class: Acute  . Lumbago with sciatica, left side 03/10/2016  . Eczematous dermatitis of eyelid 10/20/2015  . Hyperlipidemia 05/09/2015  . Environmental allergies 05/02/2015  . GERD (gastroesophageal reflux disease) 05/02/2015  . Insomnia 05/02/2015  . Arthralgia 05/02/2015    . Hypertension   . History of MI (myocardial infarction)      Current Outpatient Medications on File Prior to Visit  Medication Sig Dispense Refill  . albuterol (PROVENTIL HFA;VENTOLIN HFA) 108 (90 Base) MCG/ACT inhaler Inhale 1-2 puffs into the lungs every 6 (six) hours as needed for wheezing or shortness of breath. 1 Inhaler 0  . aspirin 81 MG tablet Take 1 tablet (81 mg total) by mouth daily. 90 tablet 3  . calcium carbonate (OS-CAL) 1250 (500 Ca) MG chewable tablet Chew 1 tablet by mouth daily.    . clopidogrel (PLAVIX) 75 MG tablet Take 1 tablet (75 mg total) by mouth daily. 30 tablet 3  . diclofenac sodium (VOLTAREN) 1 % GEL Apply 2 g topically 4 (four) times daily as needed (R bicep pain). (Patient not taking: Reported on 05/06/2017) 100 g 5  . enalapril (VASOTEC) 20 MG tablet Take 1 tablet (20 mg total) by mouth daily. 90 tablet 0  . esomeprazole (NEXIUM) 20 MG packet Take 20 mg by mouth daily before breakfast.    . hydrocortisone valerate ointment (WEST-CORT) 0.2 % Apply 1 application topically 2 (two) times daily as needed. Eyelid dermatitis (Patient not taking: Reported on 08/06/2017) 45 g 1  . isosorbide mononitrate (IMDUR) 60 MG 24 hr tablet Take 60 mg by mouth 2 (two) times daily.  3  . KRILL OIL PO Take 1 capsule by mouth daily.    . metFORMIN (GLUCOPHAGE) 500 MG tablet Take  1 tablet (500 mg total) 2 (two) times daily by mouth. 60 tablet 3  . metoprolol succinate (TOPROL-XL) 100 MG 24 hr tablet Take 1 tablet (100 mg total) by mouth daily. Take with or immediately following a meal. 90 tablet 0  . NITROSTAT 0.4 MG SL tablet Place 1 tablet (0.4 mg total) under the tongue every 5 (five) minutes as needed for chest pain. Reported on 10/20/2015 25 tablet 2  . rosuvastatin (CRESTOR) 20 MG tablet Take 1 tablet (20 mg total) by mouth daily. 90 tablet 3   No current facility-administered medications on file prior to visit.     Allergies  Allergen Reactions  . Statins Other (See Comments)     Muscle aches     Social History   Socioeconomic History  . Marital status: Divorced    Spouse name: Not on file  . Number of children: Not on file  . Years of education: Not on file  . Highest education level: Not on file  Occupational History  . Occupation: retired   Engineer, productionocial Needs  . Financial resource strain: Not on file  . Food insecurity:    Worry: Not on file    Inability: Not on file  . Transportation needs:    Medical: Not on file    Non-medical: Not on file  Tobacco Use  . Smoking status: Never Smoker  . Smokeless tobacco: Never Used  Substance and Sexual Activity  . Alcohol use: No    Alcohol/week: 0.0 standard drinks  . Drug use: No  . Sexual activity: Never  Lifestyle  . Physical activity:    Days per week: Not on file    Minutes per session: Not on file  . Stress: Not on file  Relationships  . Social connections:    Talks on phone: Not on file    Gets together: Not on file    Attends religious service: Not on file    Active member of club or organization: Not on file    Attends meetings of clubs or organizations: Not on file    Relationship status: Not on file  . Intimate partner violence:    Fear of current or ex partner: Not on file    Emotionally abused: Not on file    Physically abused: Not on file    Forced sexual activity: Not on file  Other Topics Concern  . Not on file  Social History Narrative  . Not on file    Family History  Problem Relation Age of Onset  . Heart disease Mother   . Heart disease Father   . Heart disease Brother     Past Surgical History:  Procedure Laterality Date  . CARDIAC CATHETERIZATION  04/20/2016  . CARDIAC CATHETERIZATION N/A 04/20/2016   Procedure: Left Heart Cath and Cors/Grafts Angiography;  Surgeon: Orpah CobbAjay Kadakia, MD;  Location: [facility];  Service: Cardiovascular;  Laterality: N/A;  . CORONARY ARTERY BYPASS GRAFT  2002   Quadruple Bypass in [place]    ROS: Review of Systems Negative  except as above.  Requests prescription for Singulair which she takes as needed for allergy symptoms. PHYSICAL EXAM: BP 123/73   Pulse 67   Temp 97.9 F (36.6 C) (Oral)   Resp 16   Wt 174 lb (78.9 kg)   SpO2 96%   BMI 28.96 kg/m   Wt Readings from Last 3 Encounters:  03/13/18 174 lb (78.9 kg)  12/10/17 170 lb 3.2 oz (77.2 kg)  08/06/17 170  lb (77.1 kg)    Physical Exam  General appearance - alert, well appearing, and in no distress Mental status - normal mood, behavior, speech, dress, motor activity, and thought processes Mouth - mucous membranes moist, pharynx normal without lesions Neck - supple, no significant adenopathy Chest - clear to auscultation, no wheezes, rales or rhonchi, symmetric air entry Heart - normal rate, regular rhythm, normal S1, S2, no murmurs, rubs, clicks or gallops Extremities - peripheral pulses normal, no pedal edema, no clubbing or cyanosis   Results for orders placed or performed in visit on 03/13/18  POCT glucose (manual entry)  Result Value Ref Range   POC Glucose 117 (A) 70 - 99 mg/dl  POCT glycosylated hemoglobin (Hb A1C)  Result Value Ref Range   Hemoglobin A1C     HbA1c POC (<> result, manual entry) 6.1 4.0 - 5.6 %   HbA1c, POC (prediabetic range)     HbA1c, POC (controlled diabetic range)      ASSESSMENT AND PLAN: 1. Essential hypertension At goal.  Continue enalapril and metoprolol. - CBC - Comprehensive metabolic panel - Lipid panel  2. Gastroesophageal reflux disease without esophagitis GERD precautions discussed and encouraged.  Stop Nexium - ranitidine (ZANTAC) 150 MG tablet; Take 1 tablet (150 mg total) by mouth 2 (two) times daily.  Dispense: 180 tablet; Refill: 3  3. Prediabetes A1c still in the range for prediabetes.  Encourage healthy eating habits - POCT glucose (manual entry) - POCT glycosylated hemoglobin (Hb A1C) - Microalbumin / creatinine urine ratio  4. Chronic left-sided low back pain with left-sided  sciatica She uses tramadol sparingly.  Denies any significant side effects from the medication. [place] reviewed after patient had left the office.  This reveals that patient had been getting refills from her cardiologist and last 2 refills from me.  We will need to discuss on her next visit and go over our controlled substance prescribing agreement.. - traMADol (ULTRAM) 50 MG tablet; TAKE ONE TABLET BY MOUTH every twelve HOURS AS NEEDED FOR SEVERE pain  Dispense: 60 tablet; Refill: 0 - traMADol (ULTRAM) 50 MG tablet; Take 1 tablet (50 mg total) by mouth every 12 (twelve) hours as needed (to fill on or after 04/12/2018).  Dispense: 60 tablet; Refill: 0  5. Environmental allergies - montelukast (SINGULAIR) 10 MG tablet; Take 1 tablet (10 mg total) by mouth daily.  Dispense: 90 tablet; Refill: 2  6. Coronary artery disease of autologous vein bypass graft with stable angina pectoris (HCC) Followed by cardiology. 7. Influenza vaccination declined   Patient was given the opportunity to ask questions.  Patient verbalized understanding of the plan and was able to repeat key elements of the plan.   Orders Placed This Encounter  Procedures  . Microalbumin / creatinine urine ratio  . CBC  . Comprehensive metabolic panel  . Lipid panel  . POCT glucose (manual entry)  . POCT glycosylated hemoglobin (Hb A1C)     Requested Prescriptions   Signed Prescriptions Disp Refills  . clotrimazole-betamethasone (LOTRISONE) cream 45 g 2    Sig: Apply to affected area TWICE DAILY  . traMADol (ULTRAM) 50 MG tablet 60 tablet 0    Sig: TAKE ONE TABLET BY MOUTH every twelve HOURS AS NEEDED FOR SEVERE pain  . ranitidine (ZANTAC) 150 MG tablet 180 tablet 3    Sig: Take 1 tablet (150 mg total) by mouth 2 (two) times daily.  . montelukast (SINGULAIR) 10 MG tablet 90 tablet 2    Sig: Take 1  tablet (10 mg total) by mouth daily.  . traMADol (ULTRAM) 50 MG tablet 60 tablet 0    Sig: Take 1 tablet (50 mg total) by  mouth every 12 (twelve) hours as needed (to fill on or after 04/12/2018).    Return in about 3 months (around 06/12/2018).  Jonah Blue, MD, FACP

## 2018-03-14 LAB — COMPREHENSIVE METABOLIC PANEL
ALK PHOS: 63 IU/L (ref 39–117)
ALT: 11 IU/L (ref 0–32)
AST: 14 IU/L (ref 0–40)
Albumin/Globulin Ratio: 1.6 (ref 1.2–2.2)
Albumin: 4.2 g/dL (ref 3.5–4.8)
BUN/Creatinine Ratio: 18 (ref 12–28)
BUN: 13 mg/dL (ref 8–27)
Bilirubin Total: 0.7 mg/dL (ref 0.0–1.2)
CO2: 25 mmol/L (ref 20–29)
CREATININE: 0.73 mg/dL (ref 0.57–1.00)
Calcium: 9.2 mg/dL (ref 8.7–10.3)
Chloride: 104 mmol/L (ref 96–106)
GFR calc Af Amer: 95 mL/min/{1.73_m2} (ref >59)
GFR calc non Af Amer: 83 mL/min/{1.73_m2} (ref >59)
Globulin, Total: 2.7 g/dL (ref 1.5–4.5)
Glucose: 97 mg/dL (ref 65–99)
Potassium: 4.7 mmol/L (ref 3.5–5.2)
SODIUM: 143 mmol/L (ref 134–144)
Total Protein: 6.9 g/dL (ref 6.0–8.5)

## 2018-03-14 LAB — LIPID PANEL
CHOLESTEROL TOTAL: 132 mg/dL (ref 100–199)
Chol/HDL Ratio: 2.9 ratio (ref 0.0–4.4)
HDL: 45 mg/dL (ref >39)
LDL CALC: 46 mg/dL (ref 0–99)
TRIGLYCERIDES: 205 mg/dL — AB (ref 0–149)
VLDL CHOLESTEROL CAL: 41 mg/dL — AB (ref 5–40)

## 2018-03-14 LAB — CBC
Hematocrit: 41.3 % (ref 34.0–46.6)
Hemoglobin: 12.8 g/dL (ref 11.1–15.9)
MCH: 26.7 pg (ref 26.6–33.0)
MCHC: 31 g/dL — AB (ref 31.5–35.7)
MCV: 86 fL (ref 79–97)
Platelets: 223 10*3/uL (ref 150–450)
RBC: 4.8 x10E6/uL (ref 3.77–5.28)
RDW: 14.3 % (ref 12.3–15.4)
WBC: 7.4 10*3/uL (ref 3.4–10.8)

## 2018-03-14 LAB — MICROALBUMIN / CREATININE URINE RATIO
CREATININE, UR: 125 mg/dL
MICROALB/CREAT RATIO: 5.3 mg/g{creat} (ref 0.0–30.0)
MICROALBUM., U, RANDOM: 6.6 ug/mL

## 2018-03-17 DIAGNOSIS — H2511 Age-related nuclear cataract, right eye: Secondary | ICD-10-CM | POA: Diagnosis not present

## 2018-03-17 DIAGNOSIS — H25812 Combined forms of age-related cataract, left eye: Secondary | ICD-10-CM | POA: Diagnosis not present

## 2018-03-19 ENCOUNTER — Telehealth: Payer: Self-pay

## 2018-03-19 NOTE — Telephone Encounter (Signed)
Contacted pt to go over lab results pt is aware and doesn't have any questions or concerns 

## 2018-04-08 ENCOUNTER — Emergency Department (HOSPITAL_COMMUNITY): Payer: Medicare HMO

## 2018-04-08 ENCOUNTER — Encounter (HOSPITAL_COMMUNITY): Payer: Self-pay | Admitting: *Deleted

## 2018-04-08 ENCOUNTER — Other Ambulatory Visit: Payer: Self-pay

## 2018-04-08 ENCOUNTER — Inpatient Hospital Stay (HOSPITAL_COMMUNITY)
Admission: EM | Admit: 2018-04-08 | Discharge: 2018-04-10 | DRG: 287 | Disposition: A | Discharge status: Home / Self Care | Payer: Medicare HMO | Attending: Cardiovascular Disease | Admitting: Cardiovascular Disease

## 2018-04-08 DIAGNOSIS — Z951 Presence of aortocoronary bypass graft: Secondary | ICD-10-CM | POA: Diagnosis not present

## 2018-04-08 DIAGNOSIS — E7800 Pure hypercholesterolemia, unspecified: Secondary | ICD-10-CM | POA: Diagnosis present

## 2018-04-08 DIAGNOSIS — I10 Essential (primary) hypertension: Secondary | ICD-10-CM | POA: Diagnosis present

## 2018-04-08 DIAGNOSIS — Z7984 Long term (current) use of oral hypoglycemic drugs: Secondary | ICD-10-CM | POA: Diagnosis not present

## 2018-04-08 DIAGNOSIS — J45909 Unspecified asthma, uncomplicated: Secondary | ICD-10-CM | POA: Diagnosis not present

## 2018-04-08 DIAGNOSIS — I25110 Atherosclerotic heart disease of native coronary artery with unstable angina pectoris: Secondary | ICD-10-CM | POA: Diagnosis not present

## 2018-04-08 DIAGNOSIS — Z79899 Other long term (current) drug therapy: Secondary | ICD-10-CM

## 2018-04-08 DIAGNOSIS — R0789 Other chest pain: Secondary | ICD-10-CM | POA: Diagnosis not present

## 2018-04-08 DIAGNOSIS — I200 Unstable angina: Secondary | ICD-10-CM

## 2018-04-08 DIAGNOSIS — I248 Other forms of acute ischemic heart disease: Secondary | ICD-10-CM | POA: Diagnosis not present

## 2018-04-08 DIAGNOSIS — Z8249 Family history of ischemic heart disease and other diseases of the circulatory system: Secondary | ICD-10-CM | POA: Diagnosis not present

## 2018-04-08 DIAGNOSIS — Z888 Allergy status to other drugs, medicaments and biological substances status: Secondary | ICD-10-CM

## 2018-04-08 DIAGNOSIS — R7303 Prediabetes: Secondary | ICD-10-CM | POA: Diagnosis present

## 2018-04-08 DIAGNOSIS — I252 Old myocardial infarction: Secondary | ICD-10-CM

## 2018-04-08 DIAGNOSIS — R079 Chest pain, unspecified: Secondary | ICD-10-CM | POA: Diagnosis not present

## 2018-04-08 DIAGNOSIS — I491 Atrial premature depolarization: Secondary | ICD-10-CM | POA: Diagnosis not present

## 2018-04-08 DIAGNOSIS — Z7902 Long term (current) use of antithrombotics/antiplatelets: Secondary | ICD-10-CM

## 2018-04-08 DIAGNOSIS — I2582 Chronic total occlusion of coronary artery: Secondary | ICD-10-CM | POA: Diagnosis present

## 2018-04-08 DIAGNOSIS — E785 Hyperlipidemia, unspecified: Secondary | ICD-10-CM | POA: Diagnosis not present

## 2018-04-08 DIAGNOSIS — I249 Acute ischemic heart disease, unspecified: Secondary | ICD-10-CM | POA: Diagnosis present

## 2018-04-08 LAB — BASIC METABOLIC PANEL
ANION GAP: 10 (ref 5–15)
BUN: 8 mg/dL (ref 8–23)
CALCIUM: 9.5 mg/dL (ref 8.9–10.3)
CO2: 26 mmol/L (ref 22–32)
Chloride: 104 mmol/L (ref 98–111)
Creatinine, Ser: 0.7 mg/dL (ref 0.44–1.00)
GFR calc Af Amer: >60 mL/min (ref >60)
GLUCOSE: 112 mg/dL — AB (ref 70–99)
Potassium: 3.8 mmol/L (ref 3.5–5.1)
SODIUM: 140 mmol/L (ref 135–145)

## 2018-04-08 LAB — CBC
HCT: 42.4 % (ref 36.0–46.0)
HEMOGLOBIN: 13 g/dL (ref 12.0–15.0)
MCH: 27.1 pg (ref 26.0–34.0)
MCHC: 30.7 g/dL (ref 30.0–36.0)
MCV: 88.5 fL (ref 80.0–100.0)
Platelets: 236 10*3/uL (ref 150–400)
RBC: 4.79 MIL/uL (ref 3.87–5.11)
RDW: 13.7 % (ref 11.5–15.5)
WBC: 7.5 10*3/uL (ref 4.0–10.5)

## 2018-04-08 LAB — I-STAT TROPONIN, ED: TROPONIN I, POC: 0 ng/mL (ref 0.00–0.08)

## 2018-04-08 LAB — TROPONIN I
TROPONIN I: 0.2 ng/mL — AB (ref <0.03)
Troponin I: 0.36 ng/mL (ref <0.03)

## 2018-04-08 LAB — HEPARIN LEVEL (UNFRACTIONATED): HEPARIN UNFRACTIONATED: 0.5 [IU]/mL (ref 0.30–0.70)

## 2018-04-08 LAB — MRSA PCR SCREENING: MRSA BY PCR: NEGATIVE

## 2018-04-08 MED ORDER — CLOPIDOGREL BISULFATE 75 MG PO TABS
75.0000 mg | ORAL_TABLET | Freq: Every day | ORAL | Status: DC
  Administered 2018-04-09 – 2018-04-10 (×2): 75 mg via ORAL
  Filled 2018-04-08 (×2): qty 1

## 2018-04-08 MED ORDER — ONDANSETRON HCL 4 MG/2ML IJ SOLN: 4.0000 mg | Freq: Four times a day (QID) | INTRAMUSCULAR | Status: DC | PRN

## 2018-04-08 MED ORDER — CALCIUM CARBONATE 1250 (500 CA) MG PO TABS
1250.0000 mg | ORAL_TABLET | Freq: Every day | ORAL | Status: DC
  Administered 2018-04-09 – 2018-04-10 (×2): 1250 mg via ORAL
  Filled 2018-04-08 (×2): qty 1

## 2018-04-08 MED ORDER — ALBUTEROL SULFATE (2.5 MG/3ML) 0.083% IN NEBU: 3.0000 mL | INHALATION_SOLUTION | Freq: Four times a day (QID) | RESPIRATORY_TRACT | Status: DC | PRN

## 2018-04-08 MED ORDER — ISOSORBIDE MONONITRATE ER 60 MG PO TB24
60.0000 mg | ORAL_TABLET | Freq: Two times a day (BID) | ORAL | Status: DC
  Administered 2018-04-09 – 2018-04-10 (×3): 60 mg via ORAL
  Filled 2018-04-08 (×3): qty 1

## 2018-04-08 MED ORDER — METFORMIN HCL 500 MG PO TABS: 500.0000 mg | ORAL_TABLET | Freq: Two times a day (BID) | ORAL | Status: DC

## 2018-04-08 MED ORDER — ENALAPRIL MALEATE 20 MG PO TABS
20.0000 mg | ORAL_TABLET | Freq: Every day | ORAL | Status: DC
  Administered 2018-04-09 – 2018-04-10 (×2): 20 mg via ORAL
  Filled 2018-04-08 (×2): qty 1

## 2018-04-08 MED ORDER — TRAMADOL HCL 50 MG PO TABS: 50.0000 mg | ORAL_TABLET | Freq: Two times a day (BID) | ORAL | Status: DC | PRN

## 2018-04-08 MED ORDER — METFORMIN HCL 500 MG PO TABS
500.0000 mg | ORAL_TABLET | Freq: Two times a day (BID) | ORAL | Status: DC
  Administered 2018-04-08 – 2018-04-10 (×3): 500 mg via ORAL
  Filled 2018-04-08 (×4): qty 1

## 2018-04-08 MED ORDER — ASPIRIN 81 MG PO CHEW: 324.0000 mg | CHEWABLE_TABLET | ORAL | Status: AC

## 2018-04-08 MED ORDER — ASPIRIN 300 MG RE SUPP: 300.0000 mg | RECTAL | Status: AC

## 2018-04-08 MED ORDER — HEPARIN BOLUS VIA INFUSION
4000.0000 [IU] | Freq: Once | INTRAVENOUS | Status: AC
  Administered 2018-04-08: 4000 [IU] via INTRAVENOUS
  Filled 2018-04-08: qty 4000

## 2018-04-08 MED ORDER — HEPARIN (PORCINE) IN NACL 100-0.45 UNIT/ML-% IJ SOLN
950.0000 [IU]/h | INTRAMUSCULAR | Status: DC
  Administered 2018-04-08: 950 [IU]/h via INTRAVENOUS
  Filled 2018-04-08 (×2): qty 250

## 2018-04-08 MED ORDER — ACETAMINOPHEN 325 MG PO TABS
650.0000 mg | ORAL_TABLET | ORAL | Status: DC | PRN
  Administered 2018-04-08 – 2018-04-09 (×2): 650 mg via ORAL
  Filled 2018-04-08 (×2): qty 2

## 2018-04-08 MED ORDER — ASPIRIN EC 81 MG PO TBEC
81.0000 mg | DELAYED_RELEASE_TABLET | Freq: Every day | ORAL | Status: DC
  Administered 2018-04-09 – 2018-04-10 (×2): 81 mg via ORAL
  Filled 2018-04-08 (×2): qty 1

## 2018-04-08 MED ORDER — ESOMEPRAZOLE MAGNESIUM 20 MG PO PACK: 20.0000 mg | PACK | Freq: Every day | ORAL | Status: DC

## 2018-04-08 MED ORDER — NITROGLYCERIN 0.4 MG SL SUBL
0.4000 mg | SUBLINGUAL_TABLET | SUBLINGUAL | Status: DC | PRN
  Administered 2018-04-09: 0.4 mg via SUBLINGUAL
  Filled 2018-04-08: qty 1

## 2018-04-08 MED ORDER — ROSUVASTATIN CALCIUM 20 MG PO TABS
20.0000 mg | ORAL_TABLET | Freq: Every day | ORAL | Status: DC
  Administered 2018-04-08 – 2018-04-09 (×2): 20 mg via ORAL
  Filled 2018-04-08 (×2): qty 1

## 2018-04-08 MED ORDER — METOPROLOL SUCCINATE ER 100 MG PO TB24
100.0000 mg | ORAL_TABLET | Freq: Every day | ORAL | Status: DC
  Administered 2018-04-09 – 2018-04-10 (×2): 100 mg via ORAL
  Filled 2018-04-08 (×2): qty 1

## 2018-04-08 MED ORDER — DILTIAZEM HCL 60 MG PO TABS
60.0000 mg | ORAL_TABLET | Freq: Three times a day (TID) | ORAL | Status: DC
  Administered 2018-04-08 – 2018-04-10 (×5): 60 mg via ORAL
  Filled 2018-04-08 (×5): qty 1

## 2018-04-08 NOTE — ED Triage Notes (Signed)
Patient c/o CP that started yesterday and has not resolved. Had 324 asa PTA, and has taken (9) nitro's over the last 24 hours. NAD noted.

## 2018-04-08 NOTE — ED Notes (Signed)
Patient requesting to inform dietary of specifications. Notified staff that patient does not eat meat, fish, eggs, grits, coffee or juice.

## 2018-04-08 NOTE — Progress Notes (Signed)
ANTICOAGULATION CONSULT NOTE - Initial Consult  Pharmacy Consult for heparin Indication: chest pain/ACS  Allergies  Allergen Reactions  . Statins Other (See Comments)    Muscle aches     Patient Measurements: Height: 5\' 4"  (162.6 cm) Weight: 172 lb (78 kg) IBW/kg (Calculated) : 54.7 Heparin Dosing Weight: 71.3kg  Vital Signs: Temp: 97.2 F (36.2 C) (10/08 0704) Temp Source: Oral (10/08 0704) BP: 108/68 (10/08 0830) Pulse Rate: 57 (10/08 0830)  Labs: Recent Labs    04/08/18 0700  HGB 13.0  HCT 42.4  PLT 236  CREATININE 0.70    Estimated Creatinine Clearance: 64.2 mL/min (by C-G formula based on SCr of 0.7 mg/dL).   Medical History: Past Medical History:  Diagnosis Date  . Asthma    "pollen, grass" (04/20/2016)  . Chronic lower back pain   . Coronary artery disease   . High cholesterol   . Hypertension Dx 1990  . Myocardial infarction (HCC) 2002  . Pneumonia 04/2016  . Sciatica 04/19/2016  . Sciatica     Medications:  Infusions:  . heparin      Assessment: 72 yof presented to the ED with CP. To start IV heparin. Baseline CBC is WNL and she is not on anticoagulation PTA.  Goal of Therapy:  Heparin level 0.3-0.7 units/ml Monitor platelets by anticoagulation protocol: Yes   Plan:  Heparin bolus 4000 units IV x 1 Heparin gtt 950 units/hr Check an 8 hr heparin level Daily heparin level and CBC  Quinton Voth, Drake Leach 04/08/2018,8:57 AM

## 2018-04-08 NOTE — ED Notes (Signed)
Patient transported to X-ray 

## 2018-04-08 NOTE — H&P (Signed)
Referring Physician:  [name] is an 72 y.o. female.                       Chief Complaint: Chest pain  HPI: 72 year old female with known CAD, CABG, Hyperlipidemia, hypertension and asthma has recurrent chest pain.  Past Medical History:  Diagnosis Date  . Asthma    "pollen, grass" (04/20/2016)  . Chronic lower back pain   . Coronary artery disease   . High cholesterol   . Hypertension Dx 1990  . Myocardial infarction (HCC) 2002  . Pneumonia 04/2016  . Sciatica 04/19/2016  . Sciatica       Past Surgical History:  Procedure Laterality Date  . CARDIAC CATHETERIZATION  04/20/2016  . CARDIAC CATHETERIZATION N/A 04/20/2016   Procedure: Left Heart Cath and Cors/Grafts Angiography;  Surgeon:  , MD;  Location: [facility];  Service: Cardiovascular;  Laterality: N/A;  . CORONARY ARTERY BYPASS GRAFT  2002   Quadruple Bypass in [place]    Family History  Problem Relation Age of Onset  . Heart disease Mother   . Heart disease Father   . Heart disease Brother    Social History:  reports that she has never smoked. She has never used smokeless tobacco. She reports that she does not drink alcohol or use drugs.  Allergies:  Allergies  Allergen Reactions  . Statins Other (See Comments)    Muscle aches      (Not in a hospital admission)  Results for orders placed or performed during the hospital encounter of 04/08/18 (from the past 48 hour(s))  Basic metabolic panel     Status: Abnormal   Collection Time: 04/08/18  7:00 AM  Result Value Ref [place]   Sodium 140 135 - 145 mmol/L   Potassium 3.8 3.5 - 5.1 mmol/L   Chloride 104 98 - 111 mmol/L   CO2 26 22 - 32 mmol/L   Glucose, Bld 112 (H) 70 - 99 mg/dL   BUN 8 8 - 23 mg/dL   Creatinine, Ser 0.70 0.44 - 1.00 mg/dL   Calcium 9.5 8.9 - 10.3 mg/dL   GFR calc non Af Amer >60 >60 mL/min   GFR calc Af Amer >60 >60 mL/min    Comment: (NOTE) The eGFR has been calculated using the CKD EPI equation. This  calculation has not been validated in all clinical situations. eGFR's persistently <60 mL/min signify possible Chronic Kidney Disease.    Anion gap 10 5 - 15    Comment: Performed at [facility], [address] [place], [place] [address]  CBC     Status: [name]   Collection Time: 04/08/18  7:00 AM  Result Value Ref Range   WBC 7.5 4.0 - 10.5 K/uL   RBC 4.79 3.87 - 5.11 MIL/uL   Hemoglobin 13.0 12.0 - 15.0 g/dL   HCT 42.4 36.0 - 46.0 %   MCV 88.5 80.0 - 100.0 fL   MCH 27.1 26.0 - 34.0 pg   MCHC 30.7 30.0 - 36.0 g/dL   RDW 13.7 11.5 - 15.5 %   Platelets 236 150 - 400 K/uL    Comment: Performed at [facility], [address] [place], [place] [address]  I-stat troponin, ED     Status: [name]   Collection Time: 04/08/18  7:23 AM  Result Value Ref Range   Troponin i, poc 0.00 0.00 - 0.08 ng/mL   Comment 3              Comment: Due to the release kinetics of cTnI, a negative result within the first hours of the onset of symptoms does not rule out myocardial infarction with certainty. If myocardial infarction is still suspected, repeat the test at appropriate intervals.    Dg Chest 2 View  Result Date: 04/08/2018 CLINICAL DATA:  Chest pain EXAM: CHEST - 2 VIEW COMPARISON:  05/27/2016 FINDINGS: Prior median sternotomy and CABG. Cardiomegaly. No confluent opacities or effusions. No acute bony abnormality. IMPRESSION: Mild cardiomegaly.  No active disease. Electronically Signed   By: Kevin  Dover M.D.   On: 04/08/2018 08:06    Review Of Systems Constitutional: No fever, chills, weight loss or gain. Eyes: No vision change, wears glasses. No discharge or pain. Ears: No hearing loss, No tinnitus. Respiratory: Positive asthma, No COPD, pneumonias. Positive shortness of breath. No hemoptysis. Cardiovascular: Positive chest pain, no palpitation, leg edema. Gastrointestinal: No nausea, vomiting, diarrhea, constipation. No GI bleed. No hepatitis. Genitourinary: No dysuria,  hematuria, kidney stone. No incontinance. Neurological: Positive headache, no stroke, seizures.  Psychiatry: No psych facility admission for anxiety, depression, suicide. No detox. Skin: No rash. Musculoskeletal: Positive joint pain, no fibromyalgia. No neck pain, back pain. Lymphadenopathy: No lymphadenopathy. Hematology: No anemia or easy bruising.   Blood pressure 108/68, pulse (!) 57, temperature (!) 97.2 F (36.2 C), temperature source Oral, resp. rate 17, height 5' 4" (1.626 m), weight 78 kg, SpO2 95 %. Body mass index is 29.52 kg/m. General appearance: alert, cooperative, appears stated age and no distress Head: Normocephalic, atraumatic. Eyes: Brown eyes, pink conjunctiva, corneas clear. PERRL, EOM's intact. Neck: No adenopathy, no carotid bruit, no JVD, supple, symmetrical, trachea midline and thyroid not enlarged. Resp: Clear to auscultation bilaterally. Mid-line scar. Cardio: Regular rate and rhythm, S1, S2 normal, II/VI systolic murmur, no click, rub or gallop GI: Soft, non-tender; bowel sounds normal; no organomegaly. Extremities: No edema, cyanosis or clubbing. Skin: Warm and dry.  Neurologic: Alert and oriented X 3, normal strength. Normal coordination and gait.  Assessment/Plan Acute coronary syndrome CAD CABG Hypertension Hyperlipidemia Asthma  Admit. R/O MI Cardiac cath v/s NM stress test.   S , MD  04/08/2018, 9:00 AM    

## 2018-04-08 NOTE — Progress Notes (Signed)
ANTICOAGULATION CONSULT NOTE  Pharmacy Consult for heparin Indication: chest pain/ACS  Allergies  Allergen Reactions  . Statins Other (See Comments)    Muscle aches   . Codeine Other (See Comments)    Bleeding in urine    Patient Measurements: Height: 5\' 4"  (162.6 cm) Weight: 169 lb 8 oz (76.9 kg) IBW/kg (Calculated) : 54.7 Heparin Dosing Weight: 71.3kg  Vital Signs: Temp: 97.5 F (36.4 C) (10/08 1539) Temp Source: Oral (10/08 1539) BP: 124/75 (10/08 1230) Pulse Rate: 63 (10/08 1230)  Labs: Recent Labs    04/08/18 0700 04/08/18 1210 04/08/18 1909  HGB 13.0  --   --   HCT 42.4  --   --   PLT 236  --   --   HEPARINUNFRC  --   --  0.50  CREATININE 0.70  --   --   TROPONINI  --  0.20*  --     Estimated Creatinine Clearance: 63.8 mL/min (by C-G formula based on SCr of 0.7 mg/dL).   Medications:  Infusions:  . heparin 950 Units/hr (04/08/18 1600)    Assessment: 72 yof presented to the ED with CP. To start IV heparin. Baseline CBC is WNL and she is not on anticoagulation PTA.  Heparin level is therapeutic at 0.5. No bleeding noted. Plan is for cath vs stress test.  Goal of Therapy:  Heparin level 0.3-0.7 units/ml Monitor platelets by anticoagulation protocol: Yes   Plan:  Continue heparin drip at 950 units/hr Confirmatory heparin level with am labs Daily heparin level and CBC   Loura Back, PharmD, BCPS Clinical Pharmacist Clinical phone for 04/08/2018 until 10p is [phone] Please check AMION for all Pharmacist numbers by unit 04/08/2018 8:19 PM

## 2018-04-08 NOTE — ED Provider Notes (Signed)
[facility] EMERGENCY DEPARTMENT Provider Note   CSN: [id number] Arrival date & time: 04/08/18  4098     History   Chief Complaint Chief Complaint  Patient presents with  . Chest Pain    HPI [name] is a 72 y.o. female.  HPI  The patient is a 72 year old female, she has a known history of high blood pressure, history of myocardial infarction, coronary disease status post bypass grafting many years ago who is followed by Dr. Algie Coffer.  She reports that she does have occasional pain, she has nitroglycerin and aspirin which she takes daily as well as clopidogrel.  She states that she has been told from her last heart catheterization that she is unable to have stenting to intervene on what blockages are left because of the location or size of these blockages however she has been told that she needs to have maximal medical management.  Last night she was complaining of pain in her chest, this pain radiates to her left shoulder and arm, she has no associated swelling of the legs fevers chills shortness of breath coughing nausea or vomiting or diaphoresis.  She has had a total of 9 nitroglycerin which she states relieves her pain intermittently throughout the night.  She is also chewed for aspirin prior to arrival per the 911 recommendations.  Her significant other at the bedside reports that she has had this pain intermittently overnight, the medical record review shows that she is followed very closely by her cardiologist Dr. Algie Coffer, is a borderline diabetic and does not smoke cigarettes.  At this time the pain in her chest is 0 out of 10, she has a slight aching in her left arm.  Past Medical History:  Diagnosis Date  . Asthma    "pollen, grass" (04/20/2016)  . Chronic lower back pain   . Coronary artery disease   . High cholesterol   . Hypertension Dx 1990  . Myocardial infarction (HCC) 2002  . Pneumonia 04/2016  . Sciatica 04/19/2016  . Sciatica      Patient Active Problem List   Diagnosis Date Noted  . Prediabetes 05/06/2017  . Psoriasis of scalp 04/27/2016  . CAD (coronary atherosclerotic disease) 04/27/2016  . Lumbago with sciatica, left side 03/10/2016  . Eczematous dermatitis of eyelid 10/20/2015  . Hyperlipidemia 05/09/2015  . Environmental allergies 05/02/2015  . GERD (gastroesophageal reflux disease) 05/02/2015  . Insomnia 05/02/2015  . Arthralgia 05/02/2015  . Hypertension   . History of MI (myocardial infarction)     Past Surgical History:  Procedure Laterality Date  . CARDIAC CATHETERIZATION  04/20/2016  . CARDIAC CATHETERIZATION N/A 04/20/2016   Procedure: Left Heart Cath and Cors/Grafts Angiography;  Surgeon: Orpah Cobb, MD;  Location: [facility] INVASIVE CV LAB;  Service: Cardiovascular;  Laterality: N/A;  . CORONARY ARTERY BYPASS GRAFT  2002   Quadruple Bypass in [place]     OB History   None      Home Medications    Prior to Admission medications   Medication Sig Start Date End Date Taking? Authorizing Provider  albuterol (PROVENTIL HFA;VENTOLIN HFA) 108 (90 Base) MCG/ACT inhaler Inhale 1-2 puffs into the lungs every 6 (six) hours as needed for wheezing or shortness of breath. 08/06/17   Marcine Matar, MD  aspirin 81 MG tablet Take 1 tablet (81 mg total) by mouth daily. 10/20/15   Funches, Gerilyn Nestle, MD  calcium carbonate (OS-CAL) 1250 (500 Ca) MG chewable tablet Chew 1 tablet by mouth daily.  [provider]  clopidogrel (PLAVIX) 75 MG tablet Take 1 tablet (75 mg total) by mouth daily. 04/21/16   Rinaldo Cloud, MD  clotrimazole-betamethasone (LOTRISONE) cream Apply to affected area TWICE DAILY 03/13/18   Marcine Matar, MD  diclofenac sodium (VOLTAREN) 1 % GEL Apply 2 g topically 4 (four) times daily as needed (R bicep pain). Patient not taking: Reported on 05/06/2017 05/17/16   Dessa Phi, MD  enalapril (VASOTEC) 20 MG tablet Take 1 tablet (20 mg total) by mouth daily. 05/16/17    Marcine Matar, MD  esomeprazole (NEXIUM) 20 MG packet Take 20 mg by mouth daily before breakfast.    [provider]  hydrocortisone valerate ointment (WEST-CORT) 0.2 % Apply 1 application topically 2 (two) times daily as needed. Eyelid dermatitis Patient not taking: Reported on 08/06/2017 10/20/15   Dessa Phi, MD  isosorbide mononitrate (IMDUR) 60 MG 24 hr tablet Take 60 mg by mouth 2 (two) times daily. 10/29/17   [provider]  KRILL OIL PO Take 1 capsule by mouth daily.    [provider]  metFORMIN (GLUCOPHAGE) 500 MG tablet Take 1 tablet (500 mg total) 2 (two) times daily by mouth. 05/06/17   Marcine Matar, MD  metoprolol succinate (TOPROL-XL) 100 MG 24 hr tablet Take 1 tablet (100 mg total) by mouth daily. Take with or immediately following a meal. 10/10/17   Marcine Matar, MD  montelukast (SINGULAIR) 10 MG tablet Take 1 tablet (10 mg total) by mouth daily. 03/13/18   Marcine Matar, MD  NITROSTAT 0.4 MG SL tablet Place 1 tablet (0.4 mg total) under the tongue every 5 (five) minutes as needed for chest pain. Reported on 10/20/2015 09/04/16   Dessa Phi, MD  ranitidine (ZANTAC) 150 MG tablet Take 1 tablet (150 mg total) by mouth 2 (two) times daily. 03/13/18   Marcine Matar, MD  rosuvastatin (CRESTOR) 20 MG tablet Take 1 tablet (20 mg total) by mouth daily. 01/17/17   Funches, Gerilyn Nestle, MD  traMADol (ULTRAM) 50 MG tablet TAKE ONE TABLET BY MOUTH every twelve HOURS AS NEEDED FOR SEVERE pain 03/13/18   Marcine Matar, MD  traMADol (ULTRAM) 50 MG tablet Take 1 tablet (50 mg total) by mouth every 12 (twelve) hours as needed (to fill on or after 04/12/2018). 03/13/18   Marcine Matar, MD    Family History Family History  Problem Relation Age of Onset  . Heart disease Mother   . Heart disease Father   . Heart disease Brother     Social History Social History   Tobacco Use  . Smoking status: Never Smoker  . Smokeless tobacco: Never  Used  Substance Use Topics  . Alcohol use: No    Alcohol/week: 0.0 standard drinks  . Drug use: No     Allergies   Statins   Review of Systems Review of Systems  All other systems reviewed and are negative.    Physical Exam Updated Vital Signs BP 108/68   Pulse (!) 57   Temp (!) 97.2 F (36.2 C) (Oral)   Resp 17   Ht 1.626 m (5\' 4" )   Wt 78 kg   SpO2 95%   BMI 29.52 kg/m   Physical Exam  Constitutional: She appears well-developed and well-nourished. No distress.  HENT:  Head: Normocephalic and atraumatic.  Mouth/Throat: Oropharynx is clear and moist. No oropharyngeal exudate.  Eyes: Pupils are equal, round, and reactive to light. Conjunctivae and EOM are normal. Right eye  exhibits no discharge. Left eye exhibits no discharge. No scleral icterus.  Neck: Normal range of motion. Neck supple. No JVD present. No thyromegaly present.  Cardiovascular: Normal rate, regular rhythm, normal heart sounds and intact distal pulses. Exam reveals no gallop and no friction rub.  No murmur heard. Slight irregularity to her cardiac rhythm, pulses are normal, no edema, no JVD  Pulmonary/Chest: Effort normal and breath sounds normal. No respiratory distress. She has no wheezes. She has no rales.  Abdominal: Soft. Bowel sounds are normal. She exhibits no distension and no mass. There is no tenderness.  Musculoskeletal: Normal range of motion. She exhibits no edema or tenderness.  Lymphadenopathy:    She has no cervical adenopathy.  Neurological: She is alert. Coordination normal.  Skin: Skin is warm and dry. No rash noted. No erythema.  Psychiatric: She has a normal mood and affect. Her behavior is normal.  Nursing note and vitals reviewed.    ED Treatments / Results  Labs (all labs ordered are listed, but only abnormal results are displayed) Labs Reviewed  BASIC METABOLIC PANEL - Abnormal; Notable for the following components:      Result Value   Glucose, Bld 112 (*)    All  other components within normal limits  CBC  I-STAT TROPONIN, ED    EKG EKG Interpretation  Date/Time:  Tuesday April 08 2018 07:00:26 EDT Ventricular Rate:  76 PR Interval:    QRS Duration: 118 QT Interval:  408 QTC Calculation: 459 R Axis:   -41 Text Interpretation:  Atrial fibrillation Nonspecific IVCD with LAD Inferior infarct, old Consider anterior infarct Lateral leads are also involved Since last tracing Nonspecific ST abnormality more prominent Confirmed by Eber Hong ([id number]) on 04/08/2018 7:13:54 AM   Radiology Dg Chest 2 View  Result Date: 04/08/2018 CLINICAL DATA:  Chest pain EXAM: CHEST - 2 VIEW COMPARISON:  05/27/2016 FINDINGS: Prior median sternotomy and CABG. Cardiomegaly. No confluent opacities or effusions. No acute bony abnormality. IMPRESSION: Mild cardiomegaly.  No active disease. Electronically Signed   By: Charlett Nose M.D.   On: 04/08/2018 08:06    Procedures .Critical Care Performed by: Eber Hong, MD Authorized by: Eber Hong, MD   Critical care provider statement:    Critical care time (minutes):  35   Critical care time was exclusive of:  Separately billable procedures and treating other patients and teaching time   Critical care was necessary to treat or prevent imminent or life-threatening deterioration of the following conditions:  Cardiac failure   Critical care was time spent personally by me on the following activities:  Blood draw for specimens, development of treatment plan with patient or surrogate, discussions with consultants, evaluation of patient's response to treatment, examination of patient, obtaining history from patient or surrogate, ordering and performing treatments and interventions, ordering and review of laboratory studies, ordering and review of radiographic studies, pulse oximetry, re-evaluation of patient's condition and review of old charts   (including critical care time)  Medications Ordered in ED Medications - No  data to display   Initial Impression / Assessment and Plan / ED Course  I have reviewed the triage vital signs and the nursing notes.  Pertinent labs & imaging results that were available during my care of the patient were reviewed by me and considered in my medical decision making (see chart for details).     The EKG is slightly abnormal compared to prior with some nonspecific ST abnormalities however overall it is low voltage, there is  no ST elevations, no significant changes.  The patient is at very high risk for recurrent coronary disease, I will discuss with her cardiologist once the labs are back, she likely needs to be in the hospital today for further evaluation as she is definitely at high risk for recurrent MI.  Case was discussed with the patient's cardiologist, Dr. Algie Coffer, he will come to admit the patient and agrees with heparin treatment as the patient likely has unstable angina.  Thankfully the lab review shows no elevation in creatinine or troponin.  I have looked at the chest x-ray, I think there is some cardiomegaly but no other acute pulmonary disease.  Final Clinical Impressions(s) / ED Diagnoses   Final diagnoses:  Unstable angina (HCC)      Eber Hong, MD 04/08/18 [phone]

## 2018-04-08 NOTE — ED Notes (Signed)
Meal tray ordered for patient.

## 2018-04-08 NOTE — Progress Notes (Addendum)
CRITICAL VALUE ALERT  Critical Value:  Troponin 0.36  Date & Time Notied:  04/08/18 1910  Provider Notified: MD [facility]  Orders Received/Actions taken: Awaiting orders. Patient stable.

## 2018-04-09 ENCOUNTER — Encounter (HOSPITAL_COMMUNITY)
Admission: EM | Disposition: A | Discharge status: Home / Self Care | Payer: Self-pay | Source: Home / Self Care | Attending: Cardiovascular Disease

## 2018-04-09 ENCOUNTER — Encounter (HOSPITAL_COMMUNITY): Payer: Self-pay | Admitting: Cardiovascular Disease

## 2018-04-09 HISTORY — PX: CARDIAC CATHETERIZATION: SHX172

## 2018-04-09 HISTORY — PX: LEFT HEART CATH AND CORS/GRAFTS ANGIOGRAPHY: CATH118250

## 2018-04-09 LAB — BASIC METABOLIC PANEL
Anion gap: 9 (ref 5–15)
BUN: 10 mg/dL (ref 8–23)
CALCIUM: 8.9 mg/dL (ref 8.9–10.3)
CO2: 25 mmol/L (ref 22–32)
Chloride: 102 mmol/L (ref 98–111)
Creatinine, Ser: 0.76 mg/dL (ref 0.44–1.00)
GFR calc Af Amer: >60 mL/min (ref >60)
GLUCOSE: 99 mg/dL (ref 70–99)
POTASSIUM: 4.1 mmol/L (ref 3.5–5.1)
Sodium: 136 mmol/L (ref 135–145)

## 2018-04-09 LAB — LIPID PANEL
CHOL/HDL RATIO: 2.8 ratio
Cholesterol: 130 mg/dL (ref 0–200)
HDL: 46 mg/dL (ref >40)
LDL Cholesterol: 66 mg/dL (ref 0–99)
Triglycerides: 88 mg/dL (ref <150)
VLDL: 18 mg/dL (ref 0–40)

## 2018-04-09 LAB — CBC
HEMATOCRIT: 41.7 % (ref 36.0–46.0)
Hemoglobin: 12.6 g/dL (ref 12.0–15.0)
MCH: 26.6 pg (ref 26.0–34.0)
MCHC: 30.2 g/dL (ref 30.0–36.0)
MCV: 88 fL (ref 80.0–100.0)
NRBC: 0 % (ref 0.0–0.2)
Platelets: 243 10*3/uL (ref 150–400)
RBC: 4.74 MIL/uL (ref 3.87–5.11)
RDW: 13.8 % (ref 11.5–15.5)
WBC: 8.7 10*3/uL (ref 4.0–10.5)

## 2018-04-09 LAB — GLUCOSE, CAPILLARY: GLUCOSE-CAPILLARY: 85 mg/dL (ref 70–99)

## 2018-04-09 LAB — TROPONIN I: TROPONIN I: 0.38 ng/mL — AB (ref <0.03)

## 2018-04-09 LAB — HEPARIN LEVEL (UNFRACTIONATED): Heparin Unfractionated: 0.64 IU/mL (ref 0.30–0.70)

## 2018-04-09 LAB — POCT ACTIVATED CLOTTING TIME: Activated Clotting Time: 153 seconds

## 2018-04-09 LAB — PROTIME-INR
INR: 1.17
Prothrombin Time: 14.8 seconds (ref 11.4–15.2)

## 2018-04-09 SURGERY — LEFT HEART CATH AND CORS/GRAFTS ANGIOGRAPHY
Anesthesia: LOCAL

## 2018-04-09 MED ORDER — SODIUM CHLORIDE 0.9% FLUSH: 3.0000 mL | INTRAVENOUS | Status: DC | PRN

## 2018-04-09 MED ORDER — LIDOCAINE HCL (PF) 1 % IJ SOLN
INTRAMUSCULAR | Status: AC
  Filled 2018-04-09: qty 30

## 2018-04-09 MED ORDER — SODIUM CHLORIDE 0.9 % IV SOLN: INTRAVENOUS | Status: AC

## 2018-04-09 MED ORDER — HEPARIN (PORCINE) IN NACL 1000-0.9 UT/500ML-% IV SOLN
INTRAVENOUS | Status: AC
  Filled 2018-04-09: qty 1000

## 2018-04-09 MED ORDER — IOHEXOL 350 MG/ML SOLN
INTRAVENOUS | Status: DC | PRN
  Administered 2018-04-09: 75 mL via INTRA_ARTERIAL

## 2018-04-09 MED ORDER — HEPARIN (PORCINE) IN NACL 1000-0.9 UT/500ML-% IV SOLN
INTRAVENOUS | Status: DC | PRN
  Administered 2018-04-09: 500 mL

## 2018-04-09 MED ORDER — FENTANYL CITRATE (PF) 100 MCG/2ML IJ SOLN
INTRAMUSCULAR | Status: DC | PRN
  Administered 2018-04-09: 25 ug via INTRAVENOUS

## 2018-04-09 MED ORDER — SODIUM CHLORIDE 0.9% FLUSH
3.0000 mL | Freq: Two times a day (BID) | INTRAVENOUS | Status: DC
  Administered 2018-04-09 – 2018-04-10 (×2): 3 mL via INTRAVENOUS

## 2018-04-09 MED ORDER — MIDAZOLAM HCL 2 MG/2ML IJ SOLN
INTRAMUSCULAR | Status: AC
  Filled 2018-04-09: qty 2

## 2018-04-09 MED ORDER — SODIUM CHLORIDE 0.9% FLUSH: 3.0000 mL | Freq: Two times a day (BID) | INTRAVENOUS | Status: DC

## 2018-04-09 MED ORDER — MIDAZOLAM HCL 2 MG/2ML IJ SOLN
INTRAMUSCULAR | Status: DC | PRN
  Administered 2018-04-09: 1 mg via INTRAVENOUS

## 2018-04-09 MED ORDER — SODIUM CHLORIDE 0.9 % IV SOLN: 250.0000 mL | INTRAVENOUS | Status: DC | PRN

## 2018-04-09 MED ORDER — SODIUM CHLORIDE 0.9 % IV SOLN
INTRAVENOUS | Status: DC
  Administered 2018-04-09: 07:00:00 via INTRAVENOUS

## 2018-04-09 MED ORDER — FENTANYL CITRATE (PF) 100 MCG/2ML IJ SOLN
INTRAMUSCULAR | Status: AC
  Filled 2018-04-09: qty 2

## 2018-04-09 MED ORDER — LIDOCAINE HCL (PF) 1 % IJ SOLN
INTRAMUSCULAR | Status: DC | PRN
  Administered 2018-04-09: 10 mL

## 2018-04-09 SURGICAL SUPPLY — 9 items
CATH INFINITI 5 FR IM (CATHETERS) IMPLANT
CATH INFINITI 5FR MULTPACK ANG (CATHETERS) ×2 IMPLANT
KIT HEART LEFT (KITS) ×2 IMPLANT
PACK CARDIAC CATHETERIZATION (CUSTOM PROCEDURE TRAY) ×2 IMPLANT
SHEATH PINNACLE 5F 10CM (SHEATH) ×2 IMPLANT
SYR MEDRAD MARK V 150ML (SYRINGE) ×2 IMPLANT
TRANSDUCER W/STOPCOCK (MISCELLANEOUS) ×2 IMPLANT
WIRE EMERALD 3MM-J .035X150CM (WIRE) ×2 IMPLANT
WIRE EMERALD 3MM-J .035X260CM (WIRE) IMPLANT

## 2018-04-09 NOTE — Progress Notes (Signed)
ANTICOAGULATION CONSULT NOTE  Pharmacy Consult for heparin Indication: chest pain/ACS  Allergies  Allergen Reactions  . Statins Other (See Comments)    Muscle aches   . Codeine Other (See Comments)    Bleeding in urine    Patient Measurements: Height: 5\' 4"  (162.6 cm) Weight: 169 lb 1.6 oz (76.7 kg) IBW/kg (Calculated) : 54.7 Heparin Dosing Weight: 71.3kg  Vital Signs: Temp: 97.7 F (36.5 C) (10/09 0425) Temp Source: Oral (10/09 0425) BP: 135/87 (10/09 0425) Pulse Rate: 63 (10/09 0425)  Labs: Recent Labs    04/08/18 0700 04/08/18 1210 04/08/18 1909 04/09/18 0009 04/09/18 0500  HGB 13.0  --   --  12.6  --   HCT 42.4  --   --  41.7  --   PLT 236  --   --  243  --   LABPROT  --   --   --  14.8  --   INR  --   --   --  1.17  --   HEPARINUNFRC  --   --  0.50  --  0.64  CREATININE 0.70  --   --  0.76  --   TROPONINI  --  0.20* 0.36* 0.38*  --     Estimated Creatinine Clearance: 63.7 mL/min (by C-G formula based on SCr of 0.76 mg/dL).   Medications:  Infusions:  . sodium chloride    . sodium chloride 100 mL/hr at 04/09/18 0647  . heparin 950 Units/hr (04/08/18 1900)    Assessment: 72 yof presented to the ED with CP. To start IV heparin. Baseline CBC is WNL and she is not on anticoagulation PTA.  Heparin level remains therapeutic. No bleeding documented. Plan is for cath today.  Goal of Therapy:  Heparin level 0.3-0.7 units/ml Monitor platelets by anticoagulation protocol: Yes   Plan:  Continue heparin drip at 950 units/hr Monitor daily heparin level and CBC, s/sx bleeding Scheduled for cath 10/9   Babs Bertin, PharmD, BCPS Clinical Pharmacist Clinical phone [phone] Please check AMION for all [facility] contact numbers 04/09/2018 8:26 AM

## 2018-04-09 NOTE — Progress Notes (Addendum)
Site area: RFA Site Prior to Removal:  Level 0 Pressure Applied For: 20 min Manual:   yes Patient Status During Pull:  stable Post Pull Site:  Level 0 Post Pull Instructions Given:  yes Post Pull Pulses Present: palpable Dressing Applied:  clear Bedrest begins @ 1515 till 1915 Comments:

## 2018-04-09 NOTE — Progress Notes (Signed)
Ref: Marcine Matar, MD   Subjective:  Mild chest pain. Old cardiac cath reviewed. VS stable. Troponin-I minimally elevated.  Objective:  Vital Signs in the last 24 hours: Temp:  [97.5 F (36.4 C)-98 F (36.7 C)] 97.7 F (36.5 C) (10/09 0425) Pulse Rate:  [57-81] 63 (10/09 0425) Cardiac Rhythm: Normal sinus rhythm (10/09 0700) Resp:  [15-18] 16 (10/09 0425) BP: (91-135)/(65-97) 135/87 (10/09 0425) SpO2:  [94 %-97 %] 96 % (10/09 0425) Weight:  [76.7 kg-76.9 kg] 76.7 kg (10/09 0425)  Physical Exam: BP Readings from Last 1 Encounters:  04/09/18 135/87     Wt Readings from Last 1 Encounters:  04/09/18 76.7 kg    Weight change:  Body mass index is 29.03 kg/m. HEENT: [place]/AT, Eyes-Brown, PERL, EOMI, Conjunctiva-Pink, Sclera-Non-icteric Neck: No JVD, No bruit, Trachea midline. Lungs:  Clear, Bilateral. Cardiac:  Regular rhythm, normal S1 and S2, no S3. II/VI systolic murmur. Abdomen:  Soft, non-tender. BS present. Extremities:  No edema present. No cyanosis. No clubbing. CNS: AxOx3, Cranial nerves grossly intact, moves all 4 extremities.  Skin: Warm and dry.   Intake/Output from previous day: 10/08 0701 - 10/09 0700 In: 120 [P.O.:120] Out: -     Lab Results: BMET    Component Value Date/Time   NA 136 04/09/2018 0009   NA 140 04/08/2018 0700   NA 143 03/13/2018 1544   NA 143 05/06/2017 1555   NA 138 05/27/2016 2027   K 4.1 04/09/2018 0009   K 3.8 04/08/2018 0700   K 4.7 03/13/2018 1544   CL 102 04/09/2018 0009   CL 104 04/08/2018 0700   CL 104 03/13/2018 1544   CO2 25 04/09/2018 0009   CO2 26 04/08/2018 0700   CO2 25 03/13/2018 1544   GLUCOSE 99 04/09/2018 0009   GLUCOSE 112 (H) 04/08/2018 0700   GLUCOSE 97 03/13/2018 1544   GLUCOSE 101 (H) 05/06/2017 1555   GLUCOSE 198 (H) 05/27/2016 2027   BUN 10 04/09/2018 0009   BUN 8 04/08/2018 0700   BUN 13 03/13/2018 1544   BUN 10 05/06/2017 1555   BUN 14 05/27/2016 2027   CREATININE 0.76 04/09/2018 0009   CREATININE 0.70 04/08/2018 0700   CREATININE 0.73 03/13/2018 1544   CREATININE 0.71 05/06/2015 0904   CALCIUM 8.9 04/09/2018 0009   CALCIUM 9.5 04/08/2018 0700   CALCIUM 9.2 03/13/2018 1544   GFRNONAA >60 04/09/2018 0009   GFRNONAA >60 04/08/2018 0700   GFRNONAA 83 03/13/2018 1544   GFRNONAA 87 05/06/2015 0904   GFRAA >60 04/09/2018 0009   GFRAA >60 04/08/2018 0700   GFRAA 95 03/13/2018 1544   GFRAA >89 05/06/2015 0904   CBC    Component Value Date/Time   WBC 8.7 04/09/2018 0009   RBC 4.74 04/09/2018 0009   HGB 12.6 04/09/2018 0009   HGB 12.8 03/13/2018 1544   HCT 41.7 04/09/2018 0009   HCT 41.3 03/13/2018 1544   PLT 243 04/09/2018 0009   PLT 223 03/13/2018 1544   MCV 88.0 04/09/2018 0009   MCV 86 03/13/2018 1544   MCH 26.6 04/09/2018 0009   MCHC 30.2 04/09/2018 0009   RDW 13.8 04/09/2018 0009   RDW 14.3 03/13/2018 1544   LYMPHSABS 2.9 04/19/2016 1559   MONOABS 0.7 04/19/2016 1559   EOSABS 0.7 04/19/2016 1559   BASOSABS 0.1 04/19/2016 1559   HEPATIC Function Panel Recent Labs    05/06/17 1555 03/13/18 1544  PROT 7.4 6.9   HEMOGLOBIN A1C No components found for: HGA1C,  MPG  CARDIAC ENZYMES Lab Results  Component Value Date   TROPONINI 0.38 (HH) 04/09/2018   TROPONINI 0.36 (HH) 04/08/2018   TROPONINI 0.20 (HH) 04/08/2018   BNP No results for input(s): PROBNP in the last 8760 hours. TSH No results for input(s): TSH in the last 8760 hours. CHOLESTEROL Recent Labs    05/06/17 1555 03/13/18 1544 04/09/18 0009  CHOL 137 132 130    Scheduled Meds: . aspirin EC  81 mg Oral Daily  . calcium carbonate  1,250 mg Oral Daily  . clopidogrel  75 mg Oral Daily  . diltiazem  60 mg Oral TID WC  . enalapril  20 mg Oral Daily  . isosorbide mononitrate  60 mg Oral BID  . metFORMIN  500 mg Oral BID WC  . metoprolol succinate  100 mg Oral Daily  . rosuvastatin  20 mg Oral q1800  . sodium chloride flush  3 mL Intravenous Q12H   Continuous Infusions: . sodium  chloride    . sodium chloride 100 mL/hr at 04/09/18 0647  . heparin 950 Units/hr (04/08/18 1900)   PRN Meds:.sodium chloride, acetaminophen, albuterol, nitroGLYCERIN, ondansetron (ZOFRAN) IV, sodium chloride flush, traMADol  Assessment/Plan: Acute coronary syndrome CAD CABG HTN Hyperlipidemia Asthma  Cardiac cath today.   LOS: 1 day    Orpah Cobb  MD  04/09/2018, 9:17 AM

## 2018-04-09 NOTE — Progress Notes (Signed)
Pt complains of mild chest pain after talking with [facility] Cath Lab  About procedure this morning. BP 155/88 HR 72 sats 98% on room air. Pt states she thinks it may be from anxiety. Ntg SL given x1 with complete relief. Dierdre Highman, RN

## 2018-04-10 ENCOUNTER — Encounter (HOSPITAL_COMMUNITY): Payer: Self-pay

## 2018-04-10 LAB — BASIC METABOLIC PANEL
ANION GAP: 8 (ref 5–15)
BUN: 12 mg/dL (ref 8–23)
CALCIUM: 8.8 mg/dL — AB (ref 8.9–10.3)
CO2: 25 mmol/L (ref 22–32)
Chloride: 105 mmol/L (ref 98–111)
Creatinine, Ser: 0.75 mg/dL (ref 0.44–1.00)
GFR calc Af Amer: >60 mL/min (ref >60)
GLUCOSE: 127 mg/dL — AB (ref 70–99)
Potassium: 4.1 mmol/L (ref 3.5–5.1)
SODIUM: 138 mmol/L (ref 135–145)

## 2018-04-10 LAB — CBC
HCT: 39.4 % (ref 36.0–46.0)
Hemoglobin: 12.4 g/dL (ref 12.0–15.0)
MCH: 27.5 pg (ref 26.0–34.0)
MCHC: 31.5 g/dL (ref 30.0–36.0)
MCV: 87.4 fL (ref 80.0–100.0)
NRBC: 0 % (ref 0.0–0.2)
PLATELETS: 231 10*3/uL (ref 150–400)
RBC: 4.51 MIL/uL (ref 3.87–5.11)
RDW: 14 % (ref 11.5–15.5)
WBC: 6.7 10*3/uL (ref 4.0–10.5)

## 2018-04-10 LAB — HEPARIN LEVEL (UNFRACTIONATED)

## 2018-04-10 NOTE — Discharge Summary (Signed)
Physician Discharge Summary  Patient ID: [name] MRN: [id number] DOB/AGE: [birth year] 72 y.o.  Admit date: 04/08/2018 Discharge date: 04/10/2018  Admission Diagnoses: Acute coronary syndrome CAD CABG Hypertension Hyperlipidemia Asthma  Discharge Diagnoses:  Principal problem: Acute coronary syndrome Active Problems: Multivessel native vessel CAD CABG Recent occlusion of SVG to OM branch Chronic occlusion of bypass graft to RCA Hypertension Hyperlipidemia Asthma Abnormal troponin I from demand ischemia  Discharged Condition: fair  Hospital Course: 72 year old female with known coronary artery disease, chronic occlusion of bypass graft to RCA, CABG, hyperlipidemia, hypertension and asthma and recurrent chest pain.  Patient claims to have used 9 sublingual nitroglycerin and 1 day prior to admission. Her Troponin I levels were minimally elevated and she underwent cardiac catheterization which showed patent LIMA to LAD, total occlusion of SVG to RCA and to OM branch, chronic total occlusion of native right coronary artery, severe disease of left main LAD and left circumflex coronary artery, extensive collateral circulation to OM branch, posterolateral branch and to posterior descending coronary artery. Patient remained stable post procedure and was discharged home in stable condition with follow-up by me in 1 week and by primary care physician in 1 month.   Her Zantac was discontinued due to recent recall on the medication.   Consults: cardiology  Significant Diagnostic Studies: labs: Near normal CBC, BMET and Lipid panel except mild hypertriglyceridemia and minimal elevation of troponin I.  Treatments: cardiac meds: enalapril (Vasotec), metoprolol, SL NTG, Aspirin, Clopidogrel, diltiazem and Rosuvastatin.  Discharge Exam: Blood pressure 101/70, pulse 69, temperature 97.6 F (36.4 C), temperature source Oral, resp. rate 16, height 5\' 4"  (1.626 m), weight 76.7 kg, SpO2 94  %. General appearance: alert, cooperative and appears stated age. Head: Normocephalic, atraumatic. Eyes: Brown eyes, pink conjunctiva, corneas clear. PERRL, EOM's intact.  Neck: No adenopathy, no carotid bruit, no JVD, supple, symmetrical, trachea midline and thyroid not enlarged. Resp: Clear to auscultation bilaterally. Cardio: Regular rate and rhythm, S1, S2 normal, II/VI systolic murmur, no click, rub or gallop. GI: Soft, non-tender; bowel sounds normal; no organomegaly. Extremities: No edema, cyanosis or clubbing. No right groin hematoma. Skin: Warm and dry.  Neurologic: Alert and oriented X 3, normal strength and tone. Normal coordination and gait.  Disposition: Discharge disposition: 01-Home or Self Care        Allergies as of 04/10/2018      Reactions   Statins Other (See Comments)   Muscle aches    Codeine Other (See Comments)   Bleeding in urine      Medication List    STOP taking these medications   ranitidine 150 MG tablet Commonly known as:  ZANTAC     TAKE these medications   albuterol 108 (90 Base) MCG/ACT inhaler Commonly known as:  PROVENTIL HFA;VENTOLIN HFA Inhale 1-2 puffs into the lungs every 6 (six) hours as needed for wheezing or shortness of breath.   aspirin 81 MG tablet Take 1 tablet (81 mg total) by mouth daily.   calcium carbonate 1250 (500 Ca) MG chewable tablet Commonly known as:  OS-CAL Chew 1 tablet by mouth daily.   cholecalciferol 1000 units tablet Commonly known as:  VITAMIN D Take 1,000 Units by mouth daily.   clopidogrel 75 MG tablet Commonly known as:  PLAVIX Take 1 tablet (75 mg total) by mouth daily.   clotrimazole-betamethasone cream Commonly known as:  LOTRISONE Apply to affected area TWICE DAILY What changed:    how much to take  how to take this  when  to take this  reasons to take this   diltiazem 60 MG tablet Commonly known as:  CARDIZEM Take 60 mg by mouth 3 (three) times daily.   enalapril 20 MG  tablet Commonly known as:  VASOTEC Take 1 tablet (20 mg total) by mouth daily.   esomeprazole 20 MG packet Commonly known as:  NEXIUM Take 20 mg by mouth daily before breakfast.   isosorbide mononitrate 60 MG 24 hr tablet Commonly known as:  IMDUR Take 120 mg by mouth daily.   KRILL OIL PO Take 1 capsule by mouth daily.   metFORMIN 500 MG tablet Commonly known as:  GLUCOPHAGE Take 1 tablet (500 mg total) 2 (two) times daily by mouth.   metoprolol succinate 100 MG 24 hr tablet Commonly known as:  TOPROL-XL Take 1 tablet (100 mg total) by mouth daily. Take with or immediately following a meal.   montelukast 10 MG tablet Commonly known as:  SINGULAIR Take 1 tablet (10 mg total) by mouth daily. What changed:    when to take this  reasons to take this   NITROSTAT 0.4 MG SL tablet Generic drug:  nitroGLYCERIN Place 1 tablet (0.4 mg total) under the tongue every 5 (five) minutes as needed for chest pain. Reported on 10/20/2015   QUNOL COQ10/UBIQUINOL/MEGA PO Take 5 mLs by mouth daily.   rosuvastatin 20 MG tablet Commonly known as:  CRESTOR Take 1 tablet (20 mg total) by mouth daily.   traMADol 50 MG tablet Commonly known as:  ULTRAM TAKE ONE TABLET BY MOUTH every twelve HOURS AS NEEDED FOR SEVERE pain What changed:  Another medication with the same name was removed. Continue taking this medication, and follow the directions you see here.      Follow-up Information    Orpah Cobb, MD. Schedule an appointment as soon as possible for a visit in 1 week(s).   Specialty:  Cardiology Contact information: [address] [place] [address] [phone]        Marcine Matar, MD. Schedule an appointment as soon as possible for a visit in 1 month(s).   Specialty:  Internal Medicine Contact information: [address] [place] [address] [phone]           Signed: Ricki Rodriguez 04/10/2018, 9:46 AM

## 2018-04-11 NOTE — Consult Note (Signed)
            [facility] CM Primary Care Navigator  04/11/2018  [name] 1946/01/06 [id number]   Attemptto seepatient at the bedside to identify possible discharge needs butshewasalreadydischargedhomeper staff.  Per MD note,patient was seen for recurrent chest pain with minimally elevated troponin levels and underwent cardiac catheterization.  Patient has discharge instruction from cardiologist to follow-up with primary care provider in 1 month and cardiology follow-up in 1 week.   For additional questions please contact:  Karin Golden A. Maynor Mwangi, BSN, RN-BC [facility] PRIMARY CARE Navigator Cell: [phone]

## 2018-04-17 ENCOUNTER — Telehealth: Payer: Self-pay | Admitting: Internal Medicine

## 2018-04-17 MED ORDER — ESOMEPRAZOLE MAGNESIUM 20 MG PO PACK: 20.0000 mg | PACK | Freq: Every day | ORAL | 1 refills | Status: DC

## 2018-04-17 NOTE — Telephone Encounter (Signed)
Pt states she was in the hospital recently.   Pt states October 7 she was having chest pain all Monday night into Tuesday morning. Pt states she had to call EMS and she was admitted at 630am. Pt states she got stents   Pt states she is needing Nexium. Pt states she can get the medicine through [facility]   Please f/u with pt

## 2018-04-17 NOTE — Telephone Encounter (Signed)
Patient called requesting to speak to nurse, patient didn't specify why. Please follow up.

## 2018-04-22 DIAGNOSIS — H43813 Vitreous degeneration, bilateral: Secondary | ICD-10-CM | POA: Diagnosis not present

## 2018-04-22 DIAGNOSIS — H25042 Posterior subcapsular polar age-related cataract, left eye: Secondary | ICD-10-CM | POA: Diagnosis not present

## 2018-04-22 DIAGNOSIS — H25013 Cortical age-related cataract, bilateral: Secondary | ICD-10-CM | POA: Diagnosis not present

## 2018-04-22 DIAGNOSIS — H524 Presbyopia: Secondary | ICD-10-CM | POA: Diagnosis not present

## 2018-05-08 DIAGNOSIS — Z951 Presence of aortocoronary bypass graft: Secondary | ICD-10-CM | POA: Diagnosis not present

## 2018-05-08 DIAGNOSIS — I10 Essential (primary) hypertension: Secondary | ICD-10-CM | POA: Diagnosis not present

## 2018-05-08 DIAGNOSIS — R072 Precordial pain: Secondary | ICD-10-CM | POA: Diagnosis not present

## 2018-05-08 DIAGNOSIS — I2510 Atherosclerotic heart disease of native coronary artery without angina pectoris: Secondary | ICD-10-CM | POA: Diagnosis not present

## 2018-05-08 DIAGNOSIS — E119 Type 2 diabetes mellitus without complications: Secondary | ICD-10-CM | POA: Diagnosis not present

## 2018-05-22 DIAGNOSIS — Z0100 Encounter for examination of eyes and vision without abnormal findings: Secondary | ICD-10-CM | POA: Diagnosis not present

## 2018-06-10 ENCOUNTER — Ambulatory Visit: Payer: Medicare HMO | Attending: Internal Medicine | Admitting: Internal Medicine

## 2018-06-10 ENCOUNTER — Encounter: Payer: Self-pay | Admitting: Internal Medicine

## 2018-06-10 VITALS — BP 118/71 | HR 67 | Temp 97.7°F | Resp 16 | Wt 177.2 lb

## 2018-06-10 DIAGNOSIS — I10 Essential (primary) hypertension: Secondary | ICD-10-CM | POA: Diagnosis not present

## 2018-06-10 DIAGNOSIS — I252 Old myocardial infarction: Secondary | ICD-10-CM | POA: Insufficient documentation

## 2018-06-10 DIAGNOSIS — Z7902 Long term (current) use of antithrombotics/antiplatelets: Secondary | ICD-10-CM | POA: Insufficient documentation

## 2018-06-10 DIAGNOSIS — M5442 Lumbago with sciatica, left side: Secondary | ICD-10-CM | POA: Insufficient documentation

## 2018-06-10 DIAGNOSIS — K219 Gastro-esophageal reflux disease without esophagitis: Secondary | ICD-10-CM

## 2018-06-10 DIAGNOSIS — Z8249 Family history of ischemic heart disease and other diseases of the circulatory system: Secondary | ICD-10-CM | POA: Insufficient documentation

## 2018-06-10 DIAGNOSIS — I25718 Atherosclerosis of autologous vein coronary artery bypass graft(s) with other forms of angina pectoris: Secondary | ICD-10-CM

## 2018-06-10 DIAGNOSIS — I25810 Atherosclerosis of coronary artery bypass graft(s) without angina pectoris: Secondary | ICD-10-CM | POA: Insufficient documentation

## 2018-06-10 DIAGNOSIS — Z888 Allergy status to other drugs, medicaments and biological substances status: Secondary | ICD-10-CM | POA: Insufficient documentation

## 2018-06-10 DIAGNOSIS — Z79899 Other long term (current) drug therapy: Secondary | ICD-10-CM | POA: Insufficient documentation

## 2018-06-10 DIAGNOSIS — E785 Hyperlipidemia, unspecified: Secondary | ICD-10-CM | POA: Insufficient documentation

## 2018-06-10 DIAGNOSIS — J45909 Unspecified asthma, uncomplicated: Secondary | ICD-10-CM | POA: Insufficient documentation

## 2018-06-10 DIAGNOSIS — R7303 Prediabetes: Secondary | ICD-10-CM | POA: Insufficient documentation

## 2018-06-10 DIAGNOSIS — Z7984 Long term (current) use of oral hypoglycemic drugs: Secondary | ICD-10-CM | POA: Insufficient documentation

## 2018-06-10 DIAGNOSIS — I2582 Chronic total occlusion of coronary artery: Secondary | ICD-10-CM | POA: Insufficient documentation

## 2018-06-10 DIAGNOSIS — Z7982 Long term (current) use of aspirin: Secondary | ICD-10-CM | POA: Insufficient documentation

## 2018-06-10 DIAGNOSIS — Z885 Allergy status to narcotic agent status: Secondary | ICD-10-CM | POA: Insufficient documentation

## 2018-06-10 DIAGNOSIS — R109 Unspecified abdominal pain: Secondary | ICD-10-CM

## 2018-06-10 DIAGNOSIS — Z951 Presence of aortocoronary bypass graft: Secondary | ICD-10-CM | POA: Insufficient documentation

## 2018-06-10 DIAGNOSIS — G4700 Insomnia, unspecified: Secondary | ICD-10-CM | POA: Insufficient documentation

## 2018-06-10 DIAGNOSIS — I2510 Atherosclerotic heart disease of native coronary artery without angina pectoris: Secondary | ICD-10-CM | POA: Insufficient documentation

## 2018-06-10 LAB — GLUCOSE, POCT (MANUAL RESULT ENTRY): POC Glucose: 129 mg/dl — AB (ref 70–99)

## 2018-06-10 MED ORDER — DICLOFENAC SODIUM 1 % TD GEL: 2.0000 g | Freq: Four times a day (QID) | TRANSDERMAL | 1 refills | Status: DC

## 2018-06-10 NOTE — Progress Notes (Signed)
Patient ID: Sierra SkeensSandra Livas, female    DOB: 1946-02-10  MRN: [id number]  CC: Hospitalization Follow-up   Subjective: [name] is a 72 y.o. female who presents for chronic ds management Her concerns today include:  Patient with history of CAD s/p CADG x 4 (MI fall [id number] medical management, Dr.Kadakia), HTN, HLD, chronic LBP (LT sided sciaticaon Naprosyn andtramadol), GERD, / asthma, preDM, Vit B 12 def  CAD/HTN: Since last visit with me patient was hospitalized in October for 2 days with acute coronary syndrome.  She underwent cardiac catheterization which showed patent LIMA to LAD, total occlusion of SVG to RCA and to the OM branch and chronic total occlusion of native right coronary artery, severe disease of the left main, LAD and left circumflex.  Patient for medical management.  She has seen a cardiologist in follow-up since then.  On average she is using SL Nitro Q 3-4 days.  She has stable angina symptoms.  Reports compliance with medications.  GERD:  She stopped Ranitidine after hearing report on the news of adverse effects..  Taking the Protonix instead.  She is doing okay on this.  She tries to avoid food that causes heartburn.  PreDM:  Gaining wgh.  Reports that her eating habits have not changed but she is not as active as she used to be.  Used to go to the gym but now just walks around in her house.  She can walk about a mile at a slow pace before experiencing angina symptoms.  Reports compliance with metformin.  Complains of intermittent pain in the right flank for the past 2 weeks.  No initiating factors.  Reports that when the pain occurs it is a soreness that lasts for about an hour.  She denies any frequent urination or dysuria.  No hematuria.  It goes away with use of a heating pad. Patient Active Problem List   Diagnosis Date Noted  . Acute coronary syndrome (HCC) 04/08/2018  . Prediabetes 05/06/2017  . Psoriasis of scalp 04/27/2016  . CAD (coronary  atherosclerotic disease) 04/27/2016  . Lumbago with sciatica, left side 03/10/2016  . Eczematous dermatitis of eyelid 10/20/2015  . Hyperlipidemia 05/09/2015  . Environmental allergies 05/02/2015  . GERD (gastroesophageal reflux disease) 05/02/2015  . Insomnia 05/02/2015  . Arthralgia 05/02/2015  . Hypertension   . History of MI (myocardial infarction)      Current Outpatient Medications on File Prior to Visit  Medication Sig Dispense Refill  . pantoprazole (PROTONIX) 20 MG tablet Take 20 mg by mouth daily.    Marland Kitchen. albuterol (PROVENTIL HFA;VENTOLIN HFA) 108 (90 Base) MCG/ACT inhaler Inhale 1-2 puffs into the lungs every 6 (six) hours as needed for wheezing or shortness of breath. 1 Inhaler 0  . aspirin 81 MG tablet Take 1 tablet (81 mg total) by mouth daily. 90 tablet 3  . calcium carbonate (OS-CAL) 1250 (500 Ca) MG chewable tablet Chew 1 tablet by mouth daily.    . cholecalciferol (VITAMIN D) 1000 units tablet Take 1,000 Units by mouth daily.    . clopidogrel (PLAVIX) 75 MG tablet Take 1 tablet (75 mg total) by mouth daily. 30 tablet 3  . clotrimazole-betamethasone (LOTRISONE) cream Apply to affected area TWICE DAILY (Patient taking differently: Apply 1 application topically 2 (two) times daily as needed (to facial area). Apply to affected area TWICE DAILY) 45 g 2  . diltiazem (CARDIZEM) 60 MG tablet Take 60 mg by mouth 3 (three) times daily.    . enalapril (VASOTEC)  20 MG tablet Take 1 tablet (20 mg total) by mouth daily. 90 tablet 0  . isosorbide mononitrate (IMDUR) 60 MG 24 hr tablet Take 120 mg by mouth daily.   3  . KRILL OIL PO Take 1 capsule by mouth daily.    . metFORMIN (GLUCOPHAGE) 500 MG tablet Take 1 tablet (500 mg total) 2 (two) times daily by mouth. 60 tablet 3  . metoprolol succinate (TOPROL-XL) 100 MG 24 hr tablet Take 1 tablet (100 mg total) by mouth daily. Take with or immediately following a meal. 90 tablet 0  . montelukast (SINGULAIR) 10 MG tablet Take 1 tablet (10 mg  total) by mouth daily. (Patient taking differently: Take 10 mg by mouth daily as needed (allergies). ) 90 tablet 2  . NITROSTAT 0.4 MG SL tablet Place 1 tablet (0.4 mg total) under the tongue every 5 (five) minutes as needed for chest pain. Reported on 10/20/2015 25 tablet 2  . QUNOL COQ10/UBIQUINOL/MEGA PO Take 5 mLs by mouth daily.    . rosuvastatin (CRESTOR) 20 MG tablet Take 1 tablet (20 mg total) by mouth daily. 90 tablet 3  . traMADol (ULTRAM) 50 MG tablet TAKE ONE TABLET BY MOUTH every twelve HOURS AS NEEDED FOR SEVERE pain 60 tablet 0   No current facility-administered medications on file prior to visit.     Allergies  Allergen Reactions  . Statins Other (See Comments)    Muscle aches   . Codeine Other (See Comments)    Bleeding in urine    Social History   Socioeconomic History  . Marital status: Divorced    Spouse name: Not on file  . Number of children: Not on file  . Years of education: Not on file  . Highest education level: Not on file  Occupational History  . Occupation: retired   Engineer, production  . Financial resource strain: Not on file  . Food insecurity:    Worry: Not on file    Inability: Not on file  . Transportation needs:    Medical: Not on file    Non-medical: Not on file  Tobacco Use  . Smoking status: Never Smoker  . Smokeless tobacco: Never Used  Substance and Sexual Activity  . Alcohol use: No    Alcohol/week: 0.0 standard drinks  . Drug use: No  . Sexual activity: Never  Lifestyle  . Physical activity:    Days per week: Not on file    Minutes per session: Not on file  . Stress: Not on file  Relationships  . Social connections:    Talks on phone: Not on file    Gets together: Not on file    Attends religious service: Not on file    Active member of club or organization: Not on file    Attends meetings of clubs or organizations: Not on file    Relationship status: Not on file  . Intimate partner violence:    Fear of current or ex partner:  Not on file    Emotionally abused: Not on file    Physically abused: Not on file    Forced sexual activity: Not on file  Other Topics Concern  . Not on file  Social History Narrative  . Not on file    Family History  Problem Relation Age of Onset  . Heart disease Mother   . Heart disease Father   . Heart disease Brother     Past Surgical History:  Procedure Laterality Date  . CARDIAC  CATHETERIZATION  04/20/2016  . CARDIAC CATHETERIZATION N/A 04/20/2016   Procedure: Left Heart Cath and Cors/Grafts Angiography;  Surgeon: Orpah Cobb, MD;  Location: [facility];  Service: Cardiovascular;  Laterality: N/A;  . CARDIAC CATHETERIZATION Right 04/09/2018  . CORONARY ARTERY BYPASS GRAFT  2002   Quadruple Bypass in [place]  . LEFT HEART CATH AND CORS/GRAFTS ANGIOGRAPHY N/A 04/09/2018   Procedure: LEFT HEART CATH AND CORS/GRAFTS ANGIOGRAPHY;  Surgeon: Orpah Cobb, MD;  Location: [facility];  Service: Cardiovascular;  Laterality: N/A;    ROS: Review of Systems Negative except as above. PHYSICAL EXAM: BP 118/71   Pulse 67   Temp 97.7 F (36.5 C) (Oral)   Resp 16   Wt 177 lb 3.2 oz (80.4 kg)   SpO2 94%   BMI 30.42 kg/m   Wt Readings from Last 3 Encounters:  06/10/18 177 lb 3.2 oz (80.4 kg)  04/10/18 169 lb (76.7 kg)  03/13/18 174 lb (78.9 kg)   Physical Exam  General appearance - alert, well appearing, and in no distress Mental status - normal mood, behavior, speech, dress, motor activity, and thought processes Neck - supple, no significant adenopathy Chest - clear to auscultation, no wheezes, rales or rhonchi, symmetric air entry Heart - normal rate, regular rhythm, normal S1, S2, no murmurs, rubs, clicks or gallops Musculoskeletal -no tenderness on palpation of the right flank.  No tenderness on palpation of the thoracic spine. Extremities -no lower extremity edema.   Results for orders placed or performed in visit on 06/10/18  POCT glucose (manual  entry)  Result Value Ref Range   POC Glucose 129 (A) 70 - 99 mg/dl   Lab Results  Component Value Date   HGBA1C 6.1 03/13/2018    ASSESSMENT AND PLAN: 1. Prediabetes Continue metformin. We discussed healthy eating habits. I recommend that she speaks with her cardiologist and seek his recommendation in terms of how much she should be doing physically given the extent of her heart disease.  May benefit from cardiac rehab. - POCT glucose (manual entry)  2. Essential hypertension At goal.  3. Gastroesophageal reflux disease without esophagitis Continue Protonix.  4. Coronary artery disease of autologous vein bypass graft with stable angina pectoris (HCC) Continue beta-blocker, Crestor, aspirin, Plavix  5. Acute right flank pain Questionable etiology.  We will try her with some Voltaren gel.  Follow-up if pain does not resolve or gets worse  Patient was given the opportunity to ask questions.  Patient verbalized understanding of the plan and was able to repeat key elements of the plan.   Orders Placed This Encounter  Procedures  . POCT glucose (manual entry)     Requested Prescriptions   Signed Prescriptions Disp Refills  . diclofenac sodium (VOLTAREN) 1 % GEL 100 g 1    Sig: Apply 2 g topically 4 (four) times daily.    Return in about 2 months (around 08/11/2018).  Jonah Blue, MD, FACP

## 2018-07-08 DIAGNOSIS — R072 Precordial pain: Secondary | ICD-10-CM | POA: Diagnosis not present

## 2018-07-08 DIAGNOSIS — I10 Essential (primary) hypertension: Secondary | ICD-10-CM | POA: Diagnosis not present

## 2018-07-08 DIAGNOSIS — I2510 Atherosclerotic heart disease of native coronary artery without angina pectoris: Secondary | ICD-10-CM | POA: Diagnosis not present

## 2018-07-08 DIAGNOSIS — Z951 Presence of aortocoronary bypass graft: Secondary | ICD-10-CM | POA: Diagnosis not present

## 2018-07-17 ENCOUNTER — Telehealth: Payer: Self-pay | Admitting: Internal Medicine

## 2018-07-17 NOTE — Telephone Encounter (Signed)
1) Medication(s) Requested (by name):TRAMADOL  2) Pharmacy of Choice:[facility]  3) Special Requests:Pt states pharmacy cannot refill without provider authorizing.   Approved medications will be sent to the pharmacy, we will reach out if there is an issue.  Requests made after 3pm may not be addressed until the following business day!  If a patient is unsure of the name of the medication(s) please note and ask patient to call back when they are able to provide all info, do not send to responsible party until all information is available!

## 2018-07-21 NOTE — Telephone Encounter (Signed)
Pt was advised that it is best practice for a controlled substance to be consistently written by the same provider. She states she would like Dr. Laural Benes to be the one to write this for her moving forward. The last tramadol RX she received was on 07/04/18 for a 30 day supply, written by Dr. Algie Coffer.

## 2018-07-25 ENCOUNTER — Other Ambulatory Visit: Payer: Self-pay | Admitting: Internal Medicine

## 2018-07-25 DIAGNOSIS — M5442 Lumbago with sciatica, left side: Principal | ICD-10-CM

## 2018-07-25 DIAGNOSIS — G8929 Other chronic pain: Secondary | ICD-10-CM

## 2018-07-25 MED ORDER — TRAMADOL HCL 50 MG PO TABS: 50.0000 mg | ORAL_TABLET | Freq: Two times a day (BID) | ORAL | 0 refills | Status: DC | PRN

## 2018-07-25 MED ORDER — TRAMADOL HCL 50 MG PO TABS: ORAL_TABLET | ORAL | 0 refills | Status: DC

## 2018-07-25 NOTE — Telephone Encounter (Signed)
Contacted pt and made aware that rx will be ready for pick up on Monday. Pt asked if we could fax or call it in cause she would like to pick up the rx tomorrow.   I asked Dr. Laural Benes if I could call in rx for pt per Dr. Laural Benes that is fine   [facility] Spoke to [place] the pharmacist  Medication:Tramadol  Dosage:50mg   Quality: 60 DJM:EQAS 1 tablet by mouth every every twelve hours prn for severe  Refills: 1 but to be filled on or after 08/25/2018

## 2018-07-28 DIAGNOSIS — H2513 Age-related nuclear cataract, bilateral: Secondary | ICD-10-CM | POA: Diagnosis not present

## 2018-07-28 DIAGNOSIS — H25042 Posterior subcapsular polar age-related cataract, left eye: Secondary | ICD-10-CM | POA: Diagnosis not present

## 2018-07-28 DIAGNOSIS — H25013 Cortical age-related cataract, bilateral: Secondary | ICD-10-CM | POA: Diagnosis not present

## 2018-08-12 ENCOUNTER — Ambulatory Visit: Payer: Medicare HMO | Attending: Internal Medicine | Admitting: Internal Medicine

## 2018-08-12 ENCOUNTER — Encounter: Payer: Self-pay | Admitting: Internal Medicine

## 2018-08-12 VITALS — BP 116/73 | HR 77 | Temp 98.0°F | Resp 16 | Wt 176.0 lb

## 2018-08-12 DIAGNOSIS — J45909 Unspecified asthma, uncomplicated: Secondary | ICD-10-CM | POA: Insufficient documentation

## 2018-08-12 DIAGNOSIS — I10 Essential (primary) hypertension: Secondary | ICD-10-CM

## 2018-08-12 DIAGNOSIS — M5442 Lumbago with sciatica, left side: Secondary | ICD-10-CM | POA: Diagnosis not present

## 2018-08-12 DIAGNOSIS — Z8249 Family history of ischemic heart disease and other diseases of the circulatory system: Secondary | ICD-10-CM | POA: Insufficient documentation

## 2018-08-12 DIAGNOSIS — R7303 Prediabetes: Secondary | ICD-10-CM | POA: Diagnosis not present

## 2018-08-12 DIAGNOSIS — G4700 Insomnia, unspecified: Secondary | ICD-10-CM | POA: Insufficient documentation

## 2018-08-12 DIAGNOSIS — Z885 Allergy status to narcotic agent status: Secondary | ICD-10-CM | POA: Insufficient documentation

## 2018-08-12 DIAGNOSIS — Z791 Long term (current) use of non-steroidal anti-inflammatories (NSAID): Secondary | ICD-10-CM | POA: Insufficient documentation

## 2018-08-12 DIAGNOSIS — Z23 Encounter for immunization: Secondary | ICD-10-CM | POA: Diagnosis not present

## 2018-08-12 DIAGNOSIS — G8929 Other chronic pain: Secondary | ICD-10-CM

## 2018-08-12 DIAGNOSIS — Z888 Allergy status to other drugs, medicaments and biological substances status: Secondary | ICD-10-CM | POA: Insufficient documentation

## 2018-08-12 DIAGNOSIS — Z7982 Long term (current) use of aspirin: Secondary | ICD-10-CM | POA: Insufficient documentation

## 2018-08-12 DIAGNOSIS — E785 Hyperlipidemia, unspecified: Secondary | ICD-10-CM | POA: Insufficient documentation

## 2018-08-12 DIAGNOSIS — M2550 Pain in unspecified joint: Secondary | ICD-10-CM | POA: Insufficient documentation

## 2018-08-12 DIAGNOSIS — Z7984 Long term (current) use of oral hypoglycemic drugs: Secondary | ICD-10-CM | POA: Insufficient documentation

## 2018-08-12 DIAGNOSIS — Z79899 Other long term (current) drug therapy: Secondary | ICD-10-CM | POA: Insufficient documentation

## 2018-08-12 DIAGNOSIS — Z2821 Immunization not carried out because of patient refusal: Secondary | ICD-10-CM | POA: Diagnosis not present

## 2018-08-12 DIAGNOSIS — Z951 Presence of aortocoronary bypass graft: Secondary | ICD-10-CM | POA: Insufficient documentation

## 2018-08-12 DIAGNOSIS — I25718 Atherosclerosis of autologous vein coronary artery bypass graft(s) with other forms of angina pectoris: Secondary | ICD-10-CM | POA: Diagnosis not present

## 2018-08-12 DIAGNOSIS — K219 Gastro-esophageal reflux disease without esophagitis: Secondary | ICD-10-CM

## 2018-08-12 DIAGNOSIS — I252 Old myocardial infarction: Secondary | ICD-10-CM | POA: Insufficient documentation

## 2018-08-12 LAB — GLUCOSE, POCT (MANUAL RESULT ENTRY): POC GLUCOSE: 129 mg/dL — AB (ref 70–99)

## 2018-08-12 MED ORDER — PNEUMOCOCCAL 13-VAL CONJ VACC IM SUSP: 0.5000 mL | INTRAMUSCULAR | 0 refills | Status: AC

## 2018-08-12 MED ORDER — CLOTRIMAZOLE-BETAMETHASONE 1-0.05 % EX CREA: 1.0000 "application " | TOPICAL_CREAM | Freq: Two times a day (BID) | CUTANEOUS | 0 refills | Status: DC | PRN

## 2018-08-12 MED FILL — PREVNAR 13 SYRINGE: 1 days supply | Qty: 1 | Fill #0

## 2018-08-12 NOTE — Progress Notes (Signed)
Patient ID: Sierra SkeensSandra Sanders, female    DOB: 10/27/1945  MRN: [id number]  CC: No chief complaint on file.   Subjective: Sierra SkeensSandra Sanders is a 73 y.o. female who presents for chronic ds management Her concerns today include:  Patient with history of CAD s/p CADG x 4 (MI fall [id number] medical management, Dr.Kadakia), HTN, HLD, chronic LBP (LT sided sciaticaon Naprosyn andtramadol), GERD, / asthma, preDM, Vit B 12 def  GERD: Patient now on Protonix.  She is very careful to avoid foods that cause heartburn.  However she still gets heartburn sometimes.  She finds that drinking milk or eating yogurt helps to soothe and quiet it down.  She also finds that sometimes taking a sublingual nitroglycerin also helps.    CAD: Followed by Dr. Algie CofferKadakia.  She reports using sublingual nitroglycerin intermittently.  She has stable angina symptoms.  She reports compliance with her medications.  She reports that she walks back and forth in her house during the day to try to get in so many steps per day.  Chronic lower back pain: She takes tramadol as needed and finds it helpful.  She denies any significant side effects from the medication.  Prediabetes: Doing well on metformin.  She does well with trying to eat healthy.  HM:  Had hep A and B vaccines in 1990's.  Declines flu shot.  She is agreeable to getting the Prevnar 13.  She is also due for shingles vaccine but wants to put off having that until next visit Patient Active Problem List   Diagnosis Date Noted  . Acute coronary syndrome (HCC) 04/08/2018  . Prediabetes 05/06/2017  . Psoriasis of scalp 04/27/2016  . CAD (coronary atherosclerotic disease) 04/27/2016  . Lumbago with sciatica, left side 03/10/2016  . Eczematous dermatitis of eyelid 10/20/2015  . Hyperlipidemia 05/09/2015  . Environmental allergies 05/02/2015  . GERD (gastroesophageal reflux disease) 05/02/2015  . Insomnia 05/02/2015  . Arthralgia 05/02/2015  . Hypertension   . History of  MI (myocardial infarction)      Current Outpatient Medications on File Prior to Visit  Medication Sig Dispense Refill  . albuterol (PROVENTIL HFA;VENTOLIN HFA) 108 (90 Base) MCG/ACT inhaler Inhale 1-2 puffs into the lungs every 6 (six) hours as needed for wheezing or shortness of breath. 1 Inhaler 0  . aspirin 81 MG tablet Take 1 tablet (81 mg total) by mouth daily. 90 tablet 3  . calcium carbonate (OS-CAL) 1250 (500 Ca) MG chewable tablet Chew 1 tablet by mouth daily.    . cholecalciferol (VITAMIN D) 1000 units tablet Take 1,000 Units by mouth daily.    . clopidogrel (PLAVIX) 75 MG tablet Take 1 tablet (75 mg total) by mouth daily. 30 tablet 3  . clotrimazole-betamethasone (LOTRISONE) cream Apply to affected area TWICE DAILY (Patient taking differently: Apply 1 application topically 2 (two) times daily as needed (to facial area). Apply to affected area TWICE DAILY) 45 g 2  . diclofenac sodium (VOLTAREN) 1 % GEL Apply 2 g topically 4 (four) times daily. 100 g 1  . diltiazem (CARDIZEM) 60 MG tablet Take 60 mg by mouth 3 (three) times daily.    . enalapril (VASOTEC) 20 MG tablet Take 1 tablet (20 mg total) by mouth daily. 90 tablet 0  . isosorbide mononitrate (IMDUR) 60 MG 24 hr tablet Take 120 mg by mouth daily.   3  . KRILL OIL PO Take 1 capsule by mouth daily.    . metFORMIN (GLUCOPHAGE) 500 MG tablet Take 1  tablet (500 mg total) 2 (two) times daily by mouth. 60 tablet 3  . metoprolol succinate (TOPROL-XL) 100 MG 24 hr tablet Take 1 tablet (100 mg total) by mouth daily. Take with or immediately following a meal. 90 tablet 0  . montelukast (SINGULAIR) 10 MG tablet Take 1 tablet (10 mg total) by mouth daily. (Patient taking differently: Take 10 mg by mouth daily as needed (allergies). ) 90 tablet 2  . NITROSTAT 0.4 MG SL tablet Place 1 tablet (0.4 mg total) under the tongue every 5 (five) minutes as needed for chest pain. Reported on 10/20/2015 25 tablet 2  . pantoprazole (PROTONIX) 20 MG tablet  Take 20 mg by mouth daily.    Theodoro Grist COQ10/UBIQUINOL/MEGA PO Take 5 mLs by mouth daily.    . rosuvastatin (CRESTOR) 20 MG tablet Take 1 tablet (20 mg total) by mouth daily. 90 tablet 3  . traMADol (ULTRAM) 50 MG tablet TAKE ONE TABLET BY MOUTH every twelve HOURS AS NEEDED FOR SEVERE pain 60 tablet 0  . traMADol (ULTRAM) 50 MG tablet Take 1 tablet (50 mg total) by mouth 2 (two) times daily as needed. To fill on or after 08/25/2018 60 tablet 0   No current facility-administered medications on file prior to visit.     Allergies  Allergen Reactions  . Statins Other (See Comments)    Muscle aches   . Codeine Other (See Comments)    Bleeding in urine    Social History   Socioeconomic History  . Marital status: Divorced    Spouse name: Not on file  . Number of children: Not on file  . Years of education: Not on file  . Highest education level: Not on file  Occupational History  . Occupation: retired   Engineer, production  . Financial resource strain: Not on file  . Food insecurity:    Worry: Not on file    Inability: Not on file  . Transportation needs:    Medical: Not on file    Non-medical: Not on file  Tobacco Use  . Smoking status: Never Smoker  . Smokeless tobacco: Never Used  Substance and Sexual Activity  . Alcohol use: No    Alcohol/week: 0.0 standard drinks  . Drug use: No  . Sexual activity: Never  Lifestyle  . Physical activity:    Days per week: Not on file    Minutes per session: Not on file  . Stress: Not on file  Relationships  . Social connections:    Talks on phone: Not on file    Gets together: Not on file    Attends religious service: Not on file    Active member of club or organization: Not on file    Attends meetings of clubs or organizations: Not on file    Relationship status: Not on file  . Intimate partner violence:    Fear of current or ex partner: Not on file    Emotionally abused: Not on file    Physically abused: Not on file    Forced sexual  activity: Not on file  Other Topics Concern  . Not on file  Social History Narrative  . Not on file    Family History  Problem Relation Age of Onset  . Heart disease Mother   . Heart disease Father   . Heart disease Brother     Past Surgical History:  Procedure Laterality Date  . CARDIAC CATHETERIZATION  04/20/2016  . CARDIAC CATHETERIZATION N/A 04/20/2016  Procedure: Left Heart Cath and Cors/Grafts Angiography;  Surgeon: Orpah CobbAjay Kadakia, MD;  Location: [facility];  Service: Cardiovascular;  Laterality: N/A;  . CARDIAC CATHETERIZATION Right 04/09/2018  . CORONARY ARTERY BYPASS GRAFT  2002   [place]  . LEFT HEART CATH AND CORS/GRAFTS ANGIOGRAPHY N/A 04/09/2018   Procedure: LEFT HEART CATH AND CORS/GRAFTS ANGIOGRAPHY;  Surgeon: Orpah CobbKadakia, Ajay, MD;  Location: [facility];  Service: Cardiovascular;  Laterality: N/A;    ROS: Review of Systems Negative except as stated above  PHYSICAL EXAM: BP 116/73   Pulse 77   Temp 98 F (36.7 C) (Oral)   Resp 16   Wt 176 lb (79.8 kg)   SpO2 95%   BMI 30.21 kg/m   Wt Readings from Last 3 Encounters:  08/12/18 176 lb (79.8 kg)  06/10/18 177 lb 3.2 oz (80.4 kg)  04/10/18 169 lb (76.7 kg)    Physical Exam  General appearance - alert, well appearing, and in no distress Mental status - normal mood, behavior, speech, dress, motor activity, and thought processes Neck - supple, no significant adenopathy Chest - clear to auscultation, no wheezes, rales or rhonchi, symmetric air entry Heart - normal rate, regular rhythm, normal S1, S2, no murmurs, rubs, clicks or gallops Extremities - peripheral pulses normal, no pedal edema, no clubbing or cyanosis  Results for orders placed or performed in visit on 08/12/18  POCT glucose (manual entry)  Result Value Ref Range   POC Glucose 129 (A) 70 - 99 mg/dl    CMP Latest Ref Rng & Units 04/10/2018 04/09/2018 04/08/2018  Glucose 70 - 99 mg/dL 161(W127(H) 99 960(A112(H)  BUN 8  - 23 mg/dL 12 10 8   Creatinine 0.44 - 1.00 mg/dL 5.400.75 9.810.76 1.910.70  Sodium 135 - 145 mmol/L 138 136 140  Potassium 3.5 - 5.1 mmol/L 4.1 4.1 3.8  Chloride 98 - 111 mmol/L 105 102 104  CO2 22 - 32 mmol/L 25 25 26   Calcium 8.9 - 10.3 mg/dL 4.7(W8.8(L) 8.9 9.5  Total Protein 6.0 - 8.5 g/dL - - -  Total Bilirubin 0.0 - 1.2 mg/dL - - -  Alkaline Phos 39 - 117 IU/L - - -  AST 0 - 40 IU/L - - -  ALT 0 - 32 IU/L - - -   Lipid Panel     Component Value Date/Time   CHOL 130 04/09/2018 0009   CHOL 132 03/13/2018 1544   TRIG 88 04/09/2018 0009   HDL 46 04/09/2018 0009   HDL 45 03/13/2018 1544   CHOLHDL 2.8 04/09/2018 0009   VLDL 18 04/09/2018 0009   LDLCALC 66 04/09/2018 0009   LDLCALC 46 03/13/2018 1544    CBC    Component Value Date/Time   WBC 6.7 04/10/2018 0356   RBC 4.51 04/10/2018 0356   HGB 12.4 04/10/2018 0356   HGB 12.8 03/13/2018 1544   HCT 39.4 04/10/2018 0356   HCT 41.3 03/13/2018 1544   PLT 231 04/10/2018 0356   PLT 223 03/13/2018 1544   MCV 87.4 04/10/2018 0356   MCV 86 03/13/2018 1544   MCH 27.5 04/10/2018 0356   MCHC 31.5 04/10/2018 0356   RDW 14.0 04/10/2018 0356   RDW 14.3 03/13/2018 1544   LYMPHSABS 2.9 04/19/2016 1559   MONOABS 0.7 04/19/2016 1559   EOSABS 0.7 04/19/2016 1559   BASOSABS 0.1 04/19/2016 1559    ASSESSMENT AND PLAN:  1. Gastroesophageal reflux disease without esophagitis Patient advised that she can take Protonix  twice a day especially on days when she feels the heartburn is worse.  Patient states that she has a lot of the Protonix at home and will try doing that.  I also advised taking the nitroglycerin also as it is sometimes difficult for her to distinguish between heartburn versus angina  2. Essential hypertension Controlled.  3. Coronary artery disease of autologous vein bypass graft with stable angina pectoris (HCC) Followed by cardiology.  Continue current medications  4. Prediabetes Encourage healthy eating habits.  Continue  metformin - POCT glucose (manual entry)  5. Chronic left-sided low back pain with left-sided sciatica Patient on tramadol and functions well on this.  I went over controlled substance prescribing agreement policy and had her sign 1 today.  6. Need for vaccination against Streptococcus pneumoniae using pneumococcal conjugate vaccine 13 Given today  7. Influenza vaccination declined  Give shingles on next visit Patient was given the opportunity to ask questions.  Patient verbalized understanding of the plan and was able to repeat key elements of the plan.   Orders Placed This Encounter  Procedures  . POCT glucose (manual entry)     Requested Prescriptions    No prescriptions requested or ordered in this encounter    No follow-ups on file.  Jonah Blue, MD, FACP

## 2018-08-12 NOTE — Patient Instructions (Addendum)
You can take an extra dose of Protonix on days when heartburn is bad.  We will plan to give you the shingles vaccine on your next visit as discussed today.  Pneumococcal Conjugate Vaccine (PCV13): What You Need to Know 1. Why get vaccinated? Vaccination can protect both children and adults from pneumococcal disease. Pneumococcal disease is caused by bacteria that can spread from person to person through close contact. It can cause ear infections, and it can also lead to more serious infections of the:  Lungs (pneumonia),  Blood (bacteremia), and  Covering of the brain and spinal cord (meningitis). Pneumococcal pneumonia is most common among adults. Pneumococcal meningitis can cause deafness and brain damage, and it kills about 1 child in 10 who get it. Anyone can get pneumococcal disease, but children under 87 years of age and adults 76 years and older, people with certain medical conditions, and cigarette smokers are at the highest risk. Before there was a vaccine, the [place] saw:  more than 700 cases of meningitis,  about 13,000 blood infections,  about 5 million ear infections, and  about 200 deaths in children under 5 each year from pneumococcal disease. Since vaccine became available, severe pneumococcal disease in these children has fallen by 88%. About 18,000 older adults die of pneumococcal disease each year in the [place]. Treatment of pneumococcal infections with penicillin and other drugs is not as effective as it used to be, because some strains of the disease have become resistant to these drugs. This makes prevention of the disease, through vaccination, even more important. 2. PCV13 vaccine Pneumococcal conjugate vaccine (called PCV13) protects against 13 types of pneumococcal bacteria. PCV13 is routinely given to children at 2, 4, 6, and 54-35 months of age. It is also recommended for children and adults 69 to 56 years of age with certain health conditions, and  for all adults 36 years of age and older. Your doctor can give you details. 3. Some people should not get this vaccine Anyone who has ever had a life-threatening allergic reaction to a dose of this vaccine, to an earlier pneumococcal vaccine called PCV7, or to any vaccine containing diphtheria toxoid (for example, DTaP), should not get PCV13. Anyone with a severe allergy to any component of PCV13 should not get the vaccine. Tell your doctor if the person being vaccinated has any severe allergies. If the person scheduled for vaccination is not feeling well, your healthcare provider might decide to reschedule the shot on another day. 4. Risks of a vaccine reaction With any medicine, including vaccines, there is a chance of reactions. These are usually mild and go away on their own, but serious reactions are also possible. Problems reported following PCV13 varied by age and dose in the series. The most common problems reported among children were:  About half became drowsy after the shot, had a temporary loss of appetite, or had redness or tenderness where the shot was given.  About 1 out of 3 had swelling where the shot was given.  About 1 out of 3 had a mild fever, and about 1 in 20 had a fever over 102.47F.  Up to about 8 out of 10 became fussy or irritable. Adults have reported pain, redness, and swelling where the shot was given; also mild fever, fatigue, headache, chills, or muscle pain. Young children who get PCV13 along with inactivated flu vaccine at the same time may be at increased risk for seizures caused by fever. Ask your doctor for more  information. Problems that could happen after any vaccine:  People sometimes faint after a medical procedure, including vaccination. Sitting or lying down for about 15 minutes can help prevent fainting, and injuries caused by a fall. Tell your doctor if you feel dizzy, or have vision changes or ringing in the ears.  Some older children and adults get  severe pain in the shoulder and have difficulty moving the arm where a shot was given. This happens very rarely.  Any medication can cause a severe allergic reaction. Such reactions from a vaccine are very rare, estimated at about 1 in a million doses, and would happen within a few minutes to a few hours after the vaccination. As with any medicine, there is a very small chance of a vaccine causing a serious injury or death. The safety of vaccines is always being monitored. For more information, visit: http://floyd.org/ 5. What if there is a serious reaction? What should I look for?  Look for anything that concerns you, such as signs of a severe allergic reaction, very high fever, or unusual behavior. Signs of a severe allergic reaction can include hives, swelling of the face and throat, difficulty breathing, a fast heartbeat, dizziness, and weakness-usually within a few minutes to a few hours after the vaccination. What should I do?  If you think it is a severe allergic reaction or other emergency that can't wait, call 9-1-1 or get the person to the nearest hospital. Otherwise, call your doctor. Reactions should be reported to the Vaccine Adverse Event Reporting System (VAERS). Your doctor should file this report, or you can do it yourself through the VAERS web site at www.vaers.LAgents.no, or by calling 1-339-059-4717. VAERS does not give medical advice. 6. The National Vaccine Injury Compensation Program The [employer] Vaccine Injury Compensation Program (VICP) is a federal program that was created to compensate people who may have been injured by certain vaccines. Persons who believe they may have been injured by a vaccine can learn about the program and about filing a claim by calling 1-726 323 5774 or visiting the VICP website at SpiritualWord.at. There is a time limit to file a claim for compensation. 7. How can I learn more?  Ask your healthcare provider. He or she can  give you the vaccine package insert or suggest other sources of information.  Call your local or state health department.  Contact the Centers for Disease Control and Prevention (CDC): ? Call (442) 203-5919 (1-800-CDC-INFO) or ? Visit CDC's website at PicCapture.uy Vaccine Information Statement PCV13 Vaccine (05/06/2014) This information is not intended to replace advice given to you by your health care provider. Make sure you discuss any questions you have with your health care provider. Document Released: 04/15/2006 Document Revised: 01/28/2018 Document Reviewed: 01/28/2018 Elsevier Interactive Patient Education  2019 [employer].

## 2018-08-15 ENCOUNTER — Telehealth: Payer: Self-pay | Admitting: Internal Medicine

## 2018-08-15 NOTE — Telephone Encounter (Signed)
New Message   Pt calling wanting to know if the Optomologist Dr. Earleen Newport contacted Dr. Laural Benes regarding her cataract surgery. Please f/u

## 2018-08-15 NOTE — Telephone Encounter (Signed)
Will forward to pcp

## 2018-08-18 NOTE — Telephone Encounter (Signed)
Contacted pt to go over Dr. Laural Benes message pt states she will contact her eye doctor and will give [place] a call later

## 2018-09-02 DIAGNOSIS — I10 Essential (primary) hypertension: Secondary | ICD-10-CM | POA: Diagnosis not present

## 2018-09-02 DIAGNOSIS — I2510 Atherosclerotic heart disease of native coronary artery without angina pectoris: Secondary | ICD-10-CM | POA: Diagnosis not present

## 2018-09-02 DIAGNOSIS — R072 Precordial pain: Secondary | ICD-10-CM | POA: Diagnosis not present

## 2018-09-02 DIAGNOSIS — Z951 Presence of aortocoronary bypass graft: Secondary | ICD-10-CM | POA: Diagnosis not present

## 2018-09-09 ENCOUNTER — Other Ambulatory Visit: Payer: Self-pay | Admitting: Internal Medicine

## 2018-09-09 DIAGNOSIS — Z9109 Other allergy status, other than to drugs and biological substances: Secondary | ICD-10-CM

## 2018-09-17 ENCOUNTER — Other Ambulatory Visit: Payer: Self-pay

## 2018-09-17 MED ORDER — TRAMADOL HCL 50 MG PO TABS: 50.0000 mg | ORAL_TABLET | Freq: Two times a day (BID) | ORAL | 1 refills | Status: DC | PRN

## 2018-09-17 NOTE — Telephone Encounter (Signed)
Pt last seen: 08/12/18 Next appt: 11/13/18 Last RX written on: 07/25/18 Date of original fill: 08/25/18 Date of refill(s): n/a  No other controlled substances were filled during this time, please refill if appropriate.

## 2018-09-18 ENCOUNTER — Other Ambulatory Visit: Payer: Self-pay

## 2018-09-18 DIAGNOSIS — R7303 Prediabetes: Secondary | ICD-10-CM

## 2018-09-18 NOTE — Telephone Encounter (Signed)
Contacted [facility] to call in Tramadol Rx. Spoke to [place]  Name: Tramadol Strength: 50MG  Sig: Take 1 tablet by mouth 2 times daily prn (Fill on or after 09/21/2018) Quantity: 60 Refills: 1

## 2018-09-22 DIAGNOSIS — I10 Essential (primary) hypertension: Secondary | ICD-10-CM | POA: Diagnosis not present

## 2018-09-22 DIAGNOSIS — Z951 Presence of aortocoronary bypass graft: Secondary | ICD-10-CM | POA: Diagnosis not present

## 2018-09-22 DIAGNOSIS — R072 Precordial pain: Secondary | ICD-10-CM | POA: Diagnosis not present

## 2018-09-22 DIAGNOSIS — I2510 Atherosclerotic heart disease of native coronary artery without angina pectoris: Secondary | ICD-10-CM | POA: Diagnosis not present

## 2018-10-02 ENCOUNTER — Telehealth: Payer: Self-pay | Admitting: Internal Medicine

## 2018-10-02 NOTE — Telephone Encounter (Signed)
1) Medication(s) Requested (by name): traMADol (ULTRAM) 50 MG tablet   2) Pharmacy of Choice: [facility] [address] [place] 3) Special Requests: Pt stated she called rx and they told her they needed auth from dr   Approved medications will be sent to the pharmacy, we will reach out if there is an issue.  Requests made after 3pm may not be addressed until the following business day!  If a patient is unsure of the name of the medication(s) please note and ask patient to call back when they are able to provide all info, do not send to responsible party until all information is available!

## 2018-10-02 NOTE — Telephone Encounter (Signed)
RX's were called into [facility] on 09/17/18.

## 2018-10-06 DIAGNOSIS — I2510 Atherosclerotic heart disease of native coronary artery without angina pectoris: Secondary | ICD-10-CM | POA: Diagnosis not present

## 2018-10-06 DIAGNOSIS — I10 Essential (primary) hypertension: Secondary | ICD-10-CM | POA: Diagnosis not present

## 2018-10-06 DIAGNOSIS — E538 Deficiency of other specified B group vitamins: Secondary | ICD-10-CM | POA: Diagnosis not present

## 2018-10-06 DIAGNOSIS — R072 Precordial pain: Secondary | ICD-10-CM | POA: Diagnosis not present

## 2018-11-05 DIAGNOSIS — I10 Essential (primary) hypertension: Secondary | ICD-10-CM | POA: Diagnosis not present

## 2018-11-05 DIAGNOSIS — R072 Precordial pain: Secondary | ICD-10-CM | POA: Diagnosis not present

## 2018-11-05 DIAGNOSIS — I2510 Atherosclerotic heart disease of native coronary artery without angina pectoris: Secondary | ICD-10-CM | POA: Diagnosis not present

## 2018-11-05 DIAGNOSIS — E538 Deficiency of other specified B group vitamins: Secondary | ICD-10-CM | POA: Diagnosis not present

## 2018-11-07 ENCOUNTER — Other Ambulatory Visit: Payer: Self-pay | Admitting: Internal Medicine

## 2018-11-07 DIAGNOSIS — Z9109 Other allergy status, other than to drugs and biological substances: Secondary | ICD-10-CM

## 2018-11-13 ENCOUNTER — Ambulatory Visit: Payer: Medicare HMO | Attending: Internal Medicine | Admitting: Internal Medicine

## 2018-11-13 ENCOUNTER — Encounter: Payer: Self-pay | Admitting: Internal Medicine

## 2018-11-13 ENCOUNTER — Telehealth: Payer: Self-pay | Admitting: Internal Medicine

## 2018-11-13 ENCOUNTER — Other Ambulatory Visit: Payer: Self-pay

## 2018-11-13 VITALS — BP 104/71 | HR 77 | Wt 174.6 lb

## 2018-11-13 DIAGNOSIS — J4520 Mild intermittent asthma, uncomplicated: Secondary | ICD-10-CM | POA: Diagnosis not present

## 2018-11-13 DIAGNOSIS — I10 Essential (primary) hypertension: Secondary | ICD-10-CM | POA: Diagnosis not present

## 2018-11-13 DIAGNOSIS — R7303 Prediabetes: Secondary | ICD-10-CM | POA: Diagnosis not present

## 2018-11-13 DIAGNOSIS — G8929 Other chronic pain: Secondary | ICD-10-CM | POA: Diagnosis not present

## 2018-11-13 DIAGNOSIS — M5442 Lumbago with sciatica, left side: Secondary | ICD-10-CM | POA: Diagnosis not present

## 2018-11-13 DIAGNOSIS — K219 Gastro-esophageal reflux disease without esophagitis: Secondary | ICD-10-CM

## 2018-11-13 DIAGNOSIS — I25718 Atherosclerosis of autologous vein coronary artery bypass graft(s) with other forms of angina pectoris: Secondary | ICD-10-CM | POA: Diagnosis not present

## 2018-11-13 MED ORDER — PANTOPRAZOLE SODIUM 20 MG PO TBEC: 20.0000 mg | DELAYED_RELEASE_TABLET | Freq: Every day | ORAL | 1 refills | Status: DC

## 2018-11-13 MED ORDER — METFORMIN HCL 500 MG PO TABS: 500.0000 mg | ORAL_TABLET | Freq: Two times a day (BID) | ORAL | 3 refills | Status: AC

## 2018-11-13 MED ORDER — ALBUTEROL SULFATE HFA 108 (90 BASE) MCG/ACT IN AERS: 1.0000 | INHALATION_SPRAY | Freq: Four times a day (QID) | RESPIRATORY_TRACT | 0 refills | Status: DC | PRN

## 2018-11-13 MED ORDER — TRAMADOL HCL 50 MG PO TABS: ORAL_TABLET | ORAL | 1 refills | Status: DC

## 2018-11-13 NOTE — Telephone Encounter (Signed)
The patient called and said the metformin is from Dr. Algie Coffer and 3 times a day

## 2018-11-13 NOTE — Progress Notes (Signed)
Virtual Visit via Telephone Note Due to current restrictions/limitations of in-office visits due to the COVID-19 pandemic, this scheduled clinical appointment was converted to a telehealth visit  I connected with [name] on 11/13/18 at 3:46 p.m EDT by telephone and verified that I am speaking with the correct person using two identifiers. I am in my office.  The patient is at home.  Only the patient and myself participated in this encounter.  I discussed the limitations, risks, security and privacy concerns of performing an evaluation and management service by telephone and the availability of in person appointments. I also discussed with the patient that there may be a patient responsible charge related to this service. The patient expressed understanding and agreed to proceed.   History of Present Illness: Patient with history of CAD s/p CADG x 4 (MI fall 2054for medical management, Dr.Kadakia), HTN, HLD, chronic LBP (LT sided sciaticaon tramadol), GERD, / asthma, preDM, Vit B 12 def  Asthma: Reports no major flareups.  She uses albuterol as needed but does not have to use it every day.  Some allergy symptoms like sneezing itchy throat and eyes which she states responds well to the Singulair which he takes only as needed.  She does not feel that she needs an allergy nasal spray.    Chronic LBP: Reports good relief with tramadol which she takes as needed.  No major side effects from the medication like drowsiness.  She tries to be as functional as her heart symptoms will allow.  GERD: Reports occasional flare of GERD if she eats things that she is not supposed to.  She is pleased with Protonix and need a refill.    CAD/HTN: Has intermittent stable angina pains for which she takes sublingual nitro.  No shortness of breath or swelling in the legs.  Has appointment with her cardiologist Dr. Algie Coffer next month.  -Reports compliance with medications and low-salt diet.  Gives blood pressure  reading today of 104/71.  Prediabetes: Reports taking metformin twice a day.  She feels she does okay with her eating habits.  No major weight changes.  She gives her current weight at 174 pounds that is 2 pounds less than when she was seen on her last visit.   Observations/Objective: Blood pressure 104/71, pulse 77, weight 174 lb 9.6 oz (79.2 kg).    Assessment and Plan: 1. Prediabetes Continue to encourage healthy eating habits and exercise as tolerated - metFORMIN (GLUCOPHAGE) 500 MG tablet; Take 1 tablet (500 mg total) by mouth 2 (two) times daily.  Dispense: 180 tablet; Refill: 3  2. Mild intermittent asthma with allergic rhinitis without complication Controlled. - albuterol (VENTOLIN HFA) 108 (90 Base) MCG/ACT inhaler; Inhale 1-2 puffs into the lungs every 6 (six) hours as needed for wheezing or shortness of breath.  Dispense: 1 Inhaler; Refill: 0  3. Essential hypertension Continue current blood pressure medications and low-salt diet.  Reported home blood pressure reading under good control  4. Gastroesophageal reflux disease without esophagitis - pantoprazole (PROTONIX) 20 MG tablet; Take 1 tablet (20 mg total) by mouth daily.  Dispense: 90 tablet; Refill: 1  5. Chronic left-sided low back pain with left-sided sciatica Patient reports benefit from the tramadol.  No major side effects. NCCSRS reviewed - traMADol (ULTRAM) 50 MG tablet; TAKE ONE TABLET BY MOUTH every twelve HOURS AS NEEDED FOR pain.  Fill first rxn on or after 11/18/2018  Dispense: 60 tablet; Refill: 1  6. Coronary artery disease of autologous vein bypass graft with  stable angina pectoris (HCC) Followed by cardiology.  Continue current medications   Follow Up Instructions: F/u in 3 mths   I discussed the assessment and treatment plan with the patient. The patient was provided an opportunity to ask questions and all were answered. The patient agreed with the plan and demonstrated an understanding of the  instructions.   The patient was advised to call back or seek an in-person evaluation if the symptoms worsen or if the condition fails to improve as anticipated.  I provided 11 minutes of non-face-to-face time during this encounter.  Addendum: Patient called back to the front desk stating that the metformin dose that she is taking is 500 mg 3 times a day and as prescribed by her cardiologist.  Last A1c on record is in September 2019 and was 6.1 Sierra Blueeborah Makoa Satz, MD

## 2018-11-14 ENCOUNTER — Other Ambulatory Visit: Payer: Self-pay

## 2018-11-14 ENCOUNTER — Telehealth: Payer: Self-pay

## 2018-11-14 DIAGNOSIS — J4520 Mild intermittent asthma, uncomplicated: Secondary | ICD-10-CM

## 2018-11-14 NOTE — Telephone Encounter (Signed)
Patient aware of the message per Dr. Laural Benes. Verbalized understanding.

## 2018-11-14 NOTE — Telephone Encounter (Signed)
Contacted [facility] to call in Tramadol 50MG  Rx. Spoke to [employer]  Name: Tramadol Strength: 50MG  Sig: Take 1 tablet by mouth every 12 hours prn for pain. Fill first rx on or after 11/18/18 Quantity: 60 Refills: 1

## 2018-11-20 DIAGNOSIS — H2512 Age-related nuclear cataract, left eye: Secondary | ICD-10-CM | POA: Diagnosis not present

## 2018-11-20 DIAGNOSIS — H25042 Posterior subcapsular polar age-related cataract, left eye: Secondary | ICD-10-CM | POA: Diagnosis not present

## 2018-11-20 DIAGNOSIS — H25012 Cortical age-related cataract, left eye: Secondary | ICD-10-CM | POA: Diagnosis not present

## 2018-11-20 DIAGNOSIS — H25812 Combined forms of age-related cataract, left eye: Secondary | ICD-10-CM | POA: Diagnosis not present

## 2018-12-10 DIAGNOSIS — R072 Precordial pain: Secondary | ICD-10-CM | POA: Diagnosis not present

## 2018-12-10 DIAGNOSIS — I2510 Atherosclerotic heart disease of native coronary artery without angina pectoris: Secondary | ICD-10-CM | POA: Diagnosis not present

## 2018-12-10 DIAGNOSIS — Z951 Presence of aortocoronary bypass graft: Secondary | ICD-10-CM | POA: Diagnosis not present

## 2018-12-10 DIAGNOSIS — I10 Essential (primary) hypertension: Secondary | ICD-10-CM | POA: Diagnosis not present

## 2019-01-08 DIAGNOSIS — H2511 Age-related nuclear cataract, right eye: Secondary | ICD-10-CM | POA: Diagnosis not present

## 2019-01-08 DIAGNOSIS — H25011 Cortical age-related cataract, right eye: Secondary | ICD-10-CM | POA: Diagnosis not present

## 2019-01-08 DIAGNOSIS — H25811 Combined forms of age-related cataract, right eye: Secondary | ICD-10-CM | POA: Diagnosis not present

## 2019-01-27 ENCOUNTER — Telehealth: Payer: Self-pay | Admitting: Internal Medicine

## 2019-01-27 DIAGNOSIS — I2510 Atherosclerotic heart disease of native coronary artery without angina pectoris: Secondary | ICD-10-CM | POA: Diagnosis not present

## 2019-01-27 DIAGNOSIS — E6609 Other obesity due to excess calories: Secondary | ICD-10-CM | POA: Diagnosis not present

## 2019-01-27 DIAGNOSIS — E119 Type 2 diabetes mellitus without complications: Secondary | ICD-10-CM | POA: Diagnosis not present

## 2019-01-27 DIAGNOSIS — R072 Precordial pain: Secondary | ICD-10-CM | POA: Diagnosis not present

## 2019-01-27 NOTE — Telephone Encounter (Signed)
Pt called informing that [facility] faxed over authorization for tramadol

## 2019-01-28 ENCOUNTER — Other Ambulatory Visit: Payer: Self-pay | Admitting: Internal Medicine

## 2019-01-28 DIAGNOSIS — Z9109 Other allergy status, other than to drugs and biological substances: Secondary | ICD-10-CM

## 2019-01-28 MED ORDER — TRAMADOL HCL 50 MG PO TABS: 50.0000 mg | ORAL_TABLET | Freq: Two times a day (BID) | ORAL | 1 refills | Status: DC | PRN

## 2019-01-28 NOTE — Telephone Encounter (Signed)
RF sent on Tramadol to [facility].  [place] reviewed.

## 2019-01-28 NOTE — Telephone Encounter (Signed)
Please fill if appropriate.  

## 2019-01-28 NOTE — Telephone Encounter (Signed)
Pt is aware provider is out of the office, Please call pt back when this [name] has been completed

## 2019-01-28 NOTE — Telephone Encounter (Signed)
A healthy care action plan was faxed from [facility]. No request for tramadol was received.

## 2019-03-03 DIAGNOSIS — Z951 Presence of aortocoronary bypass graft: Secondary | ICD-10-CM | POA: Diagnosis not present

## 2019-03-03 DIAGNOSIS — K210 Gastro-esophageal reflux disease with esophagitis: Secondary | ICD-10-CM | POA: Diagnosis not present

## 2019-03-03 DIAGNOSIS — I10 Essential (primary) hypertension: Secondary | ICD-10-CM | POA: Diagnosis not present

## 2019-03-03 DIAGNOSIS — R072 Precordial pain: Secondary | ICD-10-CM | POA: Diagnosis not present

## 2019-03-05 ENCOUNTER — Ambulatory Visit: Payer: Medicare HMO | Attending: Internal Medicine | Admitting: Internal Medicine

## 2019-03-05 ENCOUNTER — Encounter: Payer: Self-pay | Admitting: Internal Medicine

## 2019-03-05 VITALS — BP 98/61 | HR 69 | Wt 173.0 lb

## 2019-03-05 DIAGNOSIS — M5442 Lumbago with sciatica, left side: Secondary | ICD-10-CM

## 2019-03-05 DIAGNOSIS — E785 Hyperlipidemia, unspecified: Secondary | ICD-10-CM | POA: Diagnosis not present

## 2019-03-05 DIAGNOSIS — I25718 Atherosclerosis of autologous vein coronary artery bypass graft(s) with other forms of angina pectoris: Secondary | ICD-10-CM

## 2019-03-05 DIAGNOSIS — E663 Overweight: Secondary | ICD-10-CM | POA: Diagnosis not present

## 2019-03-05 DIAGNOSIS — R7303 Prediabetes: Secondary | ICD-10-CM | POA: Diagnosis not present

## 2019-03-05 DIAGNOSIS — G8929 Other chronic pain: Secondary | ICD-10-CM

## 2019-03-05 MED ORDER — TRAMADOL HCL 50 MG PO TABS: 50.0000 mg | ORAL_TABLET | Freq: Two times a day (BID) | ORAL | 0 refills | Status: DC | PRN

## 2019-03-05 NOTE — Progress Notes (Signed)
Virtual Visit via Telephone Note Due to current restrictions/limitations of in-office visits due to the COVID-19 pandemic, this scheduled clinical appointment was converted to a telehealth visit  I connected with [name] on 03/05/19 at 10:50 a.m by telephone and verified that I am speaking with the correct person using two identifiers. I am in my office.  The patient is at home.  Only the patient and myself participated in this encounter.  I discussed the limitations, risks, security and privacy concerns of performing an evaluation and management service by telephone and the availability of in person appointments. I also discussed with the patient that there may be a patient responsible charge related to this service. The patient expressed understanding and agreed to proceed.  History of Present Illness: Patient with history of CAD s/p CADG x 4 (MI fall 2078for medical management, Dr.Kadakia), HTN, HLD, chronic LBP (LT sided sciaticaon tramadol), GERD, / asthma, preDM, Vit B 12 def. patient last evaluated 10/2018.  Purpose of today's visit is chronic disease management.  Chronic LBP: Doing well on tramadol which she feels gives her pain level low enough to allow her to be functional during the day.  Last prescription was sent to [facility] on 01/27/2019 with 1 additional refill.  Patient states that she got the first prescription but has not gotten a refill.  I checked the controlled substance database and find this to be true.  She is requesting that a refill be sent to local pharmacy because she has difficulty in getting some of her prescriptions from [facility]  CAD/HTN:  Saw her cardiologist on Tuesday. Lopressor increased to 150 mg Still has to use Nitro PRN if she does too much. BP this a.m was 98/61.  Occasional dizziness if she bends over to pick up something No SOB, LE edema, PND, orthopnea  PreDM:  Reports compliance with Metformin. States her cardiologist increased Metformin to TID "I  eat to bloody much."  wgh was 173 lbs but wants to get to 160 lbs. Eats mainly cereal in the mornings and portion sizes are small.  She does a lot of pacing back and forth in her house.  Not able to do moderate exercise due to CAD with stable angina  Had cataract extraction Nov 20, 2018 LT, and RT 02/08/2019  HM:  Declines flu vaccine  Outpatient Encounter Medications as of 03/05/2019  Medication Sig  . albuterol (VENTOLIN HFA) 108 (90 Base) MCG/ACT inhaler Inhale 1-2 puffs into the lungs every 6 (six) hours as needed for wheezing or shortness of breath.  Marland Kitchen aspirin 81 MG tablet Take 1 tablet (81 mg total) by mouth daily.  . calcium carbonate (OS-CAL) 1250 (500 Ca) MG chewable tablet Chew 1 tablet by mouth daily.  . cholecalciferol (VITAMIN D) 1000 units tablet Take 1,000 Units by mouth daily.  . clopidogrel (PLAVIX) 75 MG tablet Take 1 tablet (75 mg total) by mouth daily.  . clotrimazole-betamethasone (LOTRISONE) cream Apply 1 application topically 2 (two) times daily as needed (to facial area). Apply to affected area TWICE DAILY (Patient not taking: Reported on 11/13/2018)  . diltiazem (CARDIZEM) 60 MG tablet Take 60 mg by mouth 3 (three) times daily.  . enalapril (VASOTEC) 20 MG tablet Take 1 tablet (20 mg total) by mouth daily.  . isosorbide mononitrate (IMDUR) 60 MG 24 hr tablet Take 120 mg by mouth daily.   Marland Kitchen KRILL OIL PO Take 1 capsule by mouth daily.  . metFORMIN (GLUCOPHAGE) 500 MG tablet Take 1 tablet (500 mg total)  by mouth 2 (two) times daily.  . metoprolol succinate (TOPROL-XL) 100 MG 24 hr tablet Take 1 tablet (100 mg total) by mouth daily. Take with or immediately following a meal.  . montelukast (SINGULAIR) 10 MG tablet TAKE 1 TABLET EVERY DAY  . NITROSTAT 0.4 MG SL tablet Place 1 tablet (0.4 mg total) under the tongue every 5 (five) minutes as needed for chest pain. Reported on 10/20/2015  . pantoprazole (PROTONIX) 20 MG tablet Take 1 tablet (20 mg total) by mouth daily.  [place] Breath  COQ10/UBIQUINOL/MEGA PO Take 5 mLs by mouth daily.  . rosuvastatin (CRESTOR) 20 MG tablet Take 1 tablet (20 mg total) by mouth daily.  . traMADol (ULTRAM) 50 MG tablet TAKE ONE TABLET BY MOUTH every twelve HOURS AS NEEDED FOR pain.  Fill first rxn on or after 11/18/2018  . traMADol (ULTRAM) 50 MG tablet Take 1 tablet (50 mg total) by mouth 2 (two) times daily as needed (Each prescription to last a month).   No facility-administered encounter medications on file as of 03/05/2019.     Observations/Objective: No direct observation done as this was a telephone encounter.  Assessment and Plan: 1. Prediabetes Continue metformin.  I have updated her med list for the change in dose of metformin.  Continue healthy eating habits and move as much as she is able. - CBC; Future - Comprehensive metabolic panel; Future - Hemoglobin A1c; Future  2. Overweight (BMI 25.0-29.9) See #1 above  3. Chronic left-sided low back pain with left-sided sciatica Benefits from Tramadol without major S.E.  CCPA undated 08/2018. [place] reviewed.  Tramadol rxn sent to [place] - traMADol (ULTRAM) 50 MG tablet; Take 1 tablet (50 mg total) by mouth 2 (two) times daily as needed (Each prescription to last a month).  Dispense: 60 tablet; Refill: 0  4. Hyperlipidemia, unspecified hyperlipidemia type - Lipid panel; Future  5. Coronary artery disease of autologous vein bypass graft with stable angina pectoris ([place]) - CBC; Future - Comprehensive metabolic panel; Future - Lipid panel; Future   Follow Up Instructions: F/u in 3 mths in person   I discussed the assessment and treatment plan with the patient. The patient was provided an opportunity to ask questions and all were answered. The patient agreed with the plan and demonstrated an understanding of the instructions.   The patient was advised to call back or seek an in-person evaluation if the symptoms worsen or if the condition fails to improve as  anticipated.  I provided 16 minutes of non-face-to-face time during this encounter.   Karle Plumber, MD

## 2019-03-11 ENCOUNTER — Ambulatory Visit: Payer: Medicare HMO | Attending: Family Medicine

## 2019-03-11 ENCOUNTER — Other Ambulatory Visit: Payer: Self-pay

## 2019-03-11 DIAGNOSIS — E785 Hyperlipidemia, unspecified: Secondary | ICD-10-CM | POA: Diagnosis not present

## 2019-03-11 DIAGNOSIS — I25718 Atherosclerosis of autologous vein coronary artery bypass graft(s) with other forms of angina pectoris: Secondary | ICD-10-CM | POA: Diagnosis not present

## 2019-03-11 DIAGNOSIS — R7303 Prediabetes: Secondary | ICD-10-CM

## 2019-03-12 LAB — CBC
Hematocrit: 39.8 % (ref 34.0–46.6)
Hemoglobin: 12.6 g/dL (ref 11.1–15.9)
MCH: 27.6 pg (ref 26.6–33.0)
MCHC: 31.7 g/dL (ref 31.5–35.7)
MCV: 87 fL (ref 79–97)
Platelets: 214 10*3/uL (ref 150–450)
RBC: 4.57 x10E6/uL (ref 3.77–5.28)
RDW: 14.2 % (ref 11.7–15.4)
WBC: 6.5 10*3/uL (ref 3.4–10.8)

## 2019-03-12 LAB — COMPREHENSIVE METABOLIC PANEL
ALT: 9 IU/L (ref 0–32)
AST: 11 IU/L (ref 0–40)
Albumin/Globulin Ratio: 1.7 (ref 1.2–2.2)
Albumin: 4.3 g/dL (ref 3.7–4.7)
Alkaline Phosphatase: 60 IU/L (ref 39–117)
BUN/Creatinine Ratio: 10 — ABNORMAL LOW (ref 12–28)
BUN: 7 mg/dL — ABNORMAL LOW (ref 8–27)
Bilirubin Total: 0.7 mg/dL (ref 0.0–1.2)
CO2: 27 mmol/L (ref 20–29)
Calcium: 9.7 mg/dL (ref 8.7–10.3)
Chloride: 104 mmol/L (ref 96–106)
Creatinine, Ser: 0.7 mg/dL (ref 0.57–1.00)
GFR calc Af Amer: 99 mL/min/{1.73_m2} (ref >59)
GFR calc non Af Amer: 86 mL/min/{1.73_m2} (ref >59)
Globulin, Total: 2.5 g/dL (ref 1.5–4.5)
Glucose: 104 mg/dL — ABNORMAL HIGH (ref 65–99)
Potassium: 4.3 mmol/L (ref 3.5–5.2)
Sodium: 144 mmol/L (ref 134–144)
Total Protein: 6.8 g/dL (ref 6.0–8.5)

## 2019-03-12 LAB — LIPID PANEL
Chol/HDL Ratio: 3.1 ratio (ref 0.0–4.4)
Cholesterol, Total: 134 mg/dL (ref 100–199)
HDL: 43 mg/dL (ref >39)
LDL Chol Calc (NIH): 69 mg/dL (ref 0–99)
Triglycerides: 121 mg/dL (ref 0–149)
VLDL Cholesterol Cal: 22 mg/dL (ref 5–40)

## 2019-03-12 LAB — HEMOGLOBIN A1C
Est. average glucose Bld gHb Est-mCnc: 131 mg/dL
Hgb A1c MFr Bld: 6.2 % — ABNORMAL HIGH (ref 4.8–5.6)

## 2019-03-16 ENCOUNTER — Telehealth: Payer: Self-pay

## 2019-03-16 NOTE — Telephone Encounter (Signed)
Contacted pt to go over lab results pt is aware and doesn't have any questions or concerns 

## 2019-03-28 ENCOUNTER — Other Ambulatory Visit: Payer: Self-pay | Admitting: Internal Medicine

## 2019-03-28 DIAGNOSIS — G8929 Other chronic pain: Secondary | ICD-10-CM

## 2019-03-31 ENCOUNTER — Telehealth: Payer: Self-pay | Admitting: Internal Medicine

## 2019-03-31 DIAGNOSIS — G8929 Other chronic pain: Secondary | ICD-10-CM

## 2019-03-31 NOTE — Telephone Encounter (Signed)
Pt would like a medication refill on 1) Medication(s) Requested (by name): -traMADol (ULTRAM) 50 MG tablet   2) Pharmacy of Choice: -[place], [place]

## 2019-04-01 MED ORDER — TRAMADOL HCL 50 MG PO TABS: ORAL_TABLET | ORAL | 1 refills | Status: DC

## 2019-04-01 NOTE — Telephone Encounter (Signed)
RF sent on Tramadol to [facility].  #60 to take BID PRN with 1 RF.

## 2019-04-13 DIAGNOSIS — I10 Essential (primary) hypertension: Secondary | ICD-10-CM | POA: Diagnosis not present

## 2019-04-13 DIAGNOSIS — I2510 Atherosclerotic heart disease of native coronary artery without angina pectoris: Secondary | ICD-10-CM | POA: Diagnosis not present

## 2019-04-13 DIAGNOSIS — R072 Precordial pain: Secondary | ICD-10-CM | POA: Diagnosis not present

## 2019-04-13 DIAGNOSIS — Z951 Presence of aortocoronary bypass graft: Secondary | ICD-10-CM | POA: Diagnosis not present

## 2019-04-16 ENCOUNTER — Other Ambulatory Visit: Payer: Self-pay | Admitting: Internal Medicine

## 2019-04-16 DIAGNOSIS — J4520 Mild intermittent asthma, uncomplicated: Secondary | ICD-10-CM

## 2019-04-16 DIAGNOSIS — K219 Gastro-esophageal reflux disease without esophagitis: Secondary | ICD-10-CM

## 2019-04-16 DIAGNOSIS — Z9109 Other allergy status, other than to drugs and biological substances: Secondary | ICD-10-CM

## 2019-04-16 NOTE — Telephone Encounter (Signed)
Please fill if appropriate.  

## 2019-05-19 ENCOUNTER — Other Ambulatory Visit: Payer: Self-pay | Admitting: Internal Medicine

## 2019-05-19 DIAGNOSIS — I2510 Atherosclerotic heart disease of native coronary artery without angina pectoris: Secondary | ICD-10-CM | POA: Diagnosis not present

## 2019-05-19 DIAGNOSIS — I10 Essential (primary) hypertension: Secondary | ICD-10-CM | POA: Diagnosis not present

## 2019-05-19 DIAGNOSIS — Z951 Presence of aortocoronary bypass graft: Secondary | ICD-10-CM | POA: Diagnosis not present

## 2019-05-19 DIAGNOSIS — R072 Precordial pain: Secondary | ICD-10-CM | POA: Diagnosis not present

## 2019-05-19 DIAGNOSIS — G8929 Other chronic pain: Secondary | ICD-10-CM

## 2019-06-12 ENCOUNTER — Other Ambulatory Visit: Payer: Self-pay

## 2019-06-12 ENCOUNTER — Encounter: Payer: Self-pay | Admitting: Internal Medicine

## 2019-06-12 ENCOUNTER — Ambulatory Visit: Payer: Medicare HMO | Attending: Internal Medicine | Admitting: Internal Medicine

## 2019-06-12 VITALS — BP 117/76 | HR 75 | Resp 16 | Wt 177.2 lb

## 2019-06-12 DIAGNOSIS — R42 Dizziness and giddiness: Secondary | ICD-10-CM

## 2019-06-12 DIAGNOSIS — M5442 Lumbago with sciatica, left side: Secondary | ICD-10-CM

## 2019-06-12 DIAGNOSIS — K5900 Constipation, unspecified: Secondary | ICD-10-CM

## 2019-06-12 DIAGNOSIS — R7303 Prediabetes: Secondary | ICD-10-CM

## 2019-06-12 DIAGNOSIS — M79602 Pain in left arm: Secondary | ICD-10-CM

## 2019-06-12 DIAGNOSIS — L308 Other specified dermatitis: Secondary | ICD-10-CM

## 2019-06-12 DIAGNOSIS — L309 Dermatitis, unspecified: Secondary | ICD-10-CM

## 2019-06-12 DIAGNOSIS — G8929 Other chronic pain: Secondary | ICD-10-CM | POA: Diagnosis not present

## 2019-06-12 NOTE — Patient Instructions (Signed)
Try taking Colace 100 mg daily or twice a day to help with the bowel movements.  If no improvement he should consider having colonoscopy done.  Use the Voltaren gel on the left arm.  You should also speak with your cardiologist when you see him this month about the intermittent pain in the left arm to make sure this is not related to the heart.

## 2019-06-12 NOTE — Progress Notes (Signed)
History of Present Illness: Patient with history of CAD s/p CADG x 4 (MI fall 2037for medical management, Dr.Kadakia), HTN, HLD, chronic LBP (LT sided sciaticaon tramadol), GERD, / asthma, preDM, Vit B 12 def. Last eval 03/2019.  PreDM: eating less. Doing okay on Metformin.  Walks back and forth in her house  She was having some dizziness when she bends over to pick something up Rxn Meclizine by her cardiologist but this makes her sick to her stomach.  C/o problems moving her bowels.  Normally she would have a BM every morning but for past 1 mth she sometimes skips a day.  Then when she has a BM only small round balls come out.  No blood in stools.  Using Miralax for past 3 days.  Eats a lot of greens Never had c-scope and does not wish to have one  Request RF on Lotrazone cream to use on residual rash over shoulder blades.  Gets it every yr due to bugs in her yard during the summer.  CAD:  Saw her cardiologist several wks ago and has another appt later this mth. She has episodes of stable and unstable angina. Sometimes wakes with discomfort like heartburn and has to take SL Nitro. Metoprol increased to 100 mg a.m/50 mg p.m by cardiology  C/o intermittent Lt arm pain x 1 mth Like arthritis.  No worse or better with activity Better when she lays down with this arm behind her back to put pressure on it.  No Numbness or tingling.  She was having some pain over LT shoulder blade when she saw cardiology several wks ago.  Deemed to be MSK and told to use Biofreeze.  This has helped   Outpatient Encounter Medications as of 06/12/2019  Medication Sig  . albuterol (VENTOLIN HFA) 108 (90 Base) MCG/ACT inhaler INHALE 1 TO 2 PUFFS INTO THE LUNGS EVERY 6 (SIX) HOURS AS NEEDED FOR WHEEZING OR SHORTNESS OF BREATH.  Marland Kitchen aspirin 81 MG tablet Take 1 tablet (81 mg total) by mouth daily.  . calcium carbonate (OS-CAL) 1250 (500 Ca) MG chewable tablet Chew 1 tablet by mouth daily.  . cholecalciferol (VITAMIN D)  1000 units tablet Take 1,000 Units by mouth daily.  . clopidogrel (PLAVIX) 75 MG tablet Take 1 tablet (75 mg total) by mouth daily.  . clotrimazole-betamethasone (LOTRISONE) cream Apply 1 application topically 2 (two) times daily as needed (to facial area). Apply to affected area [place] (Patient not taking: Reported on 11/13/2018)  . diltiazem (CARDIZEM) 60 MG tablet Take 60 mg by mouth 3 (three) times daily.  . enalapril (VASOTEC) 20 MG tablet Take 1 tablet (20 mg total) by mouth daily.  . isosorbide mononitrate (IMDUR) 60 MG 24 hr tablet Take 120 mg by mouth daily.   Marland Kitchen KRILL OIL PO Take 1 capsule by mouth daily.  . metFORMIN (GLUCOPHAGE) 500 MG tablet Take 1 tablet (500 mg total) by mouth 2 (two) times daily.  . metoprolol succinate (TOPROL-XL) 100 MG 24 hr tablet Take 1 tablet (100 mg total) by mouth daily. Take with or immediately following a meal.  . montelukast (SINGULAIR) 10 MG tablet TAKE 1 TABLET EVERY DAY  . NITROSTAT 0.4 MG SL tablet Place 1 tablet (0.4 mg total) under the tongue every 5 (five) minutes as needed for chest pain. Reported on 10/20/2015  . pantoprazole (PROTONIX) 20 MG tablet TAKE 1 TABLET EVERY DAY  . QUNOL COQ10/UBIQUINOL/MEGA PO Take 5 mLs by mouth daily.  . rosuvastatin (CRESTOR) 20 MG tablet  Take 1 tablet (20 mg total) by mouth daily.  . traMADol (ULTRAM) 50 MG tablet TAKE ONE TABLET BY MOUTH every twelve HOURS AS NEEDED FOR pain.  Fill first rxn on or after 04/04/2019  . traMADol (ULTRAM) 50 MG tablet Take 1 tablet (50 mg total) by mouth 2 (two) times daily as needed (Each prescription to last a month). Fill on or after 05/27/2019   No facility-administered encounter medications on file as of 06/12/2019.   Social History   Social History Narrative  . Not on file      Observations/Objective: Today's Vitals   06/12/19 1518  BP: 117/76  Pulse: 75  Resp: 16  SpO2: 96%  Weight: 177 lb 3.2 oz (80.4 kg)   Body mass index is 30.42 kg/m.  Gen:  Pt in  NAD Neck:  No LN or thyroid enlargement CVS:  RRR, no gallops. Radials and BC pulses 3+ BL Chest: clear on auscultation BL MSK:  No point tenderness on palpation of LT shoulder.  Good ROM LT shoulder Skin:  Mild excoriation over shoulder blades LT>RT  Assessment and Plan: 1. Constipation, unspecified constipation type -Continue Miralax.  Recommend adding Colace daily.  Purchase OTC.  Drink adequate fluids Recommend referral for c-scope given change in bowel habits but pt declines  2. Chronic left-sided low back pain with left-sided sciatica -on Tramadol and doing okay on it.  Not due for RF  3. Dermatitis - clotrimazole-betamethasone (LOTRISONE) cream; Apply 1 application topically 2 (two) times daily as needed (to facial area). Apply to affected area TWICE DAILY  Dispense: 15 g; Refill: 0  4. Pain of left upper extremity -pt with good pulses, arm is warm and no symptoms to suggest neurologic.  Likely MSK but also need to make sure this is not angina equivalent.  Recommend that she tells cardiologist about this when seen later this mth Use Voltaren Gel PRN  5. Dizziness Stop Meclizine Go slow when she bends over then goes to stand up.  Last CBC 03/2019 was nl  6. Prediabetes Continue healthy eating habits and Metformin   Follow Up Instructions:   3-4 mths I discussed the assessment and treatment plan with the patient. The patient was provided an opportunity to ask questions and all were answered. The patient agreed with the plan and demonstrated an understanding of the instructions.   The patient was advised to call back or seek an in-person evaluation if the symptoms worsen or if the condition fails to improve as anticipated.   Jonah Blue, MD

## 2019-06-13 MED ORDER — CLOTRIMAZOLE-BETAMETHASONE 1-0.05 % EX CREA: 1.0000 "application " | TOPICAL_CREAM | Freq: Two times a day (BID) | CUTANEOUS | 0 refills | Status: DC | PRN

## 2019-06-16 DIAGNOSIS — R072 Precordial pain: Secondary | ICD-10-CM | POA: Diagnosis not present

## 2019-06-16 DIAGNOSIS — I2510 Atherosclerotic heart disease of native coronary artery without angina pectoris: Secondary | ICD-10-CM | POA: Diagnosis not present

## 2019-06-16 DIAGNOSIS — Z951 Presence of aortocoronary bypass graft: Secondary | ICD-10-CM | POA: Diagnosis not present

## 2019-06-16 DIAGNOSIS — I422 Other hypertrophic cardiomyopathy: Secondary | ICD-10-CM | POA: Diagnosis not present

## 2019-06-22 ENCOUNTER — Other Ambulatory Visit: Payer: Self-pay | Admitting: Internal Medicine

## 2019-06-22 DIAGNOSIS — M5442 Lumbago with sciatica, left side: Secondary | ICD-10-CM

## 2019-06-22 DIAGNOSIS — G8929 Other chronic pain: Secondary | ICD-10-CM

## 2019-07-15 ENCOUNTER — Other Ambulatory Visit: Payer: Self-pay | Admitting: Internal Medicine

## 2019-07-15 DIAGNOSIS — L309 Dermatitis, unspecified: Secondary | ICD-10-CM

## 2019-07-21 DIAGNOSIS — R072 Precordial pain: Secondary | ICD-10-CM | POA: Diagnosis not present

## 2019-07-21 DIAGNOSIS — Z951 Presence of aortocoronary bypass graft: Secondary | ICD-10-CM | POA: Diagnosis not present

## 2019-07-21 DIAGNOSIS — I10 Essential (primary) hypertension: Secondary | ICD-10-CM | POA: Diagnosis not present

## 2019-07-21 DIAGNOSIS — I2510 Atherosclerotic heart disease of native coronary artery without angina pectoris: Secondary | ICD-10-CM | POA: Diagnosis not present

## 2019-07-23 ENCOUNTER — Other Ambulatory Visit: Payer: Self-pay | Admitting: Internal Medicine

## 2019-07-23 DIAGNOSIS — G8929 Other chronic pain: Secondary | ICD-10-CM

## 2019-08-07 ENCOUNTER — Other Ambulatory Visit: Payer: Self-pay

## 2019-08-07 ENCOUNTER — Encounter: Payer: Self-pay | Admitting: Internal Medicine

## 2019-08-07 ENCOUNTER — Telehealth: Payer: Self-pay

## 2019-08-07 ENCOUNTER — Ambulatory Visit: Payer: Medicare HMO | Attending: Internal Medicine | Admitting: Internal Medicine

## 2019-08-07 VITALS — BP 139/92 | HR 75 | Resp 16 | Wt 175.6 lb

## 2019-08-07 DIAGNOSIS — I25718 Atherosclerosis of autologous vein coronary artery bypass graft(s) with other forms of angina pectoris: Secondary | ICD-10-CM

## 2019-08-07 DIAGNOSIS — R7303 Prediabetes: Secondary | ICD-10-CM | POA: Diagnosis not present

## 2019-08-07 DIAGNOSIS — I10 Essential (primary) hypertension: Secondary | ICD-10-CM

## 2019-08-07 DIAGNOSIS — Z23 Encounter for immunization: Secondary | ICD-10-CM | POA: Diagnosis not present

## 2019-08-07 DIAGNOSIS — M79602 Pain in left arm: Secondary | ICD-10-CM | POA: Diagnosis not present

## 2019-08-07 DIAGNOSIS — J302 Other seasonal allergic rhinitis: Secondary | ICD-10-CM

## 2019-08-07 LAB — GLUCOSE, POCT (MANUAL RESULT ENTRY): POC Glucose: 104 mg/dl — AB (ref 70–99)

## 2019-08-07 NOTE — Patient Instructions (Addendum)
I recommend taking Claritin once a day to help with your allergy symptoms.  You will need to have your second shingles shot in 2 to 6 months.

## 2019-08-07 NOTE — Telephone Encounter (Signed)
error 

## 2019-08-07 NOTE — Progress Notes (Signed)
Patient ID: [name], female    DOB: Mar 08, 1946  MRN: [id number]  CC: Diabetes, Hypertension, and Fatigue   Subjective: [name] is a 74 y.o. female who presents for chronic ds management Her concerns today include:  Patient with history of CAD s/p CADG x 4 (MI fall 2011for medical management, Dr.Kadakia), HTN, HLD, chronic LBP (LT sided sciaticaon tramadol), GERD, / asthma, preDM, Vit B 12 def.  Patient is very upset today due to long wait time in the lobby.  Her appointment was at 2:50 PM and she was here on time.  She was checked in at the front desk but for some reason it did not pop up as check in on my or the [facility] list.  I apologized to the patient and expressed understanding of her anger.  Patient was tearful.  I did speak with the lead person for the front desk and to my CMA about the situation.  Information will also be passed on to office manager.  Today she complains of problems with her allergies.  Symptoms include rhinorrhea and feeling of phlegm in the throat.  Denies any sneezing.  No nasal congestion.  No fever.  She reports that symptoms are worse in the winter and "it depends on what is in the air."  She does have some nighttime cough at times.  She takes NyQuil for this.  She uses albuterol as needed for asthma.  She takes Singulair at nights.  On last visit she complained of pain in the left upper extremity.  My assessment was that it was likely musculoskeletal in nature and I recommend using Voltaren gel as needed.  I also recommended that she speak with her cardiologist about it to make sure that it was not an anginal equivalent.  She tells me that she did speak with him about it and he also felt it was musculoskeletal in nature and gave her a muscle relaxant tizanidine to use as needed.  She reports that the arm is now much better.  CAD: She still gets intermittent angina.  She is managed by her cardiologist.  She took nitroglycerin yesterday morning.  She  usually puts 2 under the tongue at one time and gets relief within 5 minutes.  She has not had any lower extremity edema headaches or dizziness.  Blood pressure elevated today.  Patient attributes this to the fact that she was upset about the long wait.  Home blood pressure readings have been good.  Blood pressure on last visit was also at goal.  She reports compliance with medications.  She continues to take the Metformin for prediabetes and feels she does okay with her eating habits.  She is requesting shingles vaccine and hepatitis B series.  However she would like to get just the shingles vaccine today.    Patient Active Problem List   Diagnosis Date Noted  . Acute coronary syndrome (HCC) 04/08/2018  . Prediabetes 05/06/2017  . Psoriasis of scalp 04/27/2016  . CAD (coronary atherosclerotic disease) 04/27/2016  . Lumbago with sciatica, left side 03/10/2016  . Eczematous dermatitis of eyelid 10/20/2015  . Hyperlipidemia 05/09/2015  . Environmental allergies 05/02/2015  . GERD (gastroesophageal reflux disease) 05/02/2015  . Insomnia 05/02/2015  . Arthralgia 05/02/2015  . Hypertension   . History of MI (myocardial infarction)      Current Outpatient Medications on File Prior to Visit  Medication Sig Dispense Refill  . traMADol (ULTRAM) 50 MG tablet Take 1 tablet (50 mg total) by mouth  2 (two) times daily as needed (Each prescription to last a month). Fill on or after 05/27/2019 60 tablet 0  . albuterol (VENTOLIN HFA) 108 (90 Base) MCG/ACT inhaler INHALE 1 TO 2 PUFFS INTO THE LUNGS EVERY 6 (SIX) HOURS AS NEEDED FOR WHEEZING OR SHORTNESS OF BREATH. 18 g 2  . aspirin 81 MG tablet Take 1 tablet (81 mg total) by mouth daily. 90 tablet 3  . calcium carbonate (OS-CAL) 1250 (500 Ca) MG chewable tablet Chew 1 tablet by mouth daily.    . cholecalciferol (VITAMIN D) 1000 units tablet Take 1,000 Units by mouth daily.    . clopidogrel (PLAVIX) 75 MG tablet Take 1 tablet (75 mg total) by mouth  daily. 30 tablet 3  . clotrimazole-betamethasone (LOTRISONE) cream APPLY TO AFFECTED AREA TWICE DAILY 45 g 1  . diltiazem (CARDIZEM) 60 MG tablet Take 60 mg by mouth 3 (three) times daily.    . enalapril (VASOTEC) 20 MG tablet Take 1 tablet (20 mg total) by mouth daily. 90 tablet 0  . isosorbide mononitrate (IMDUR) 60 MG 24 hr tablet Take 120 mg by mouth daily.   3  . KRILL OIL PO Take 1 capsule by mouth daily.    . metFORMIN (GLUCOPHAGE) 500 MG tablet Take 1 tablet (500 mg total) by mouth 2 (two) times daily. 180 tablet 3  . metoprolol succinate (TOPROL-XL) 100 MG 24 hr tablet Take 1 tablet (100 mg total) by mouth daily. Take with or immediately following a meal. 90 tablet 0  . montelukast (SINGULAIR) 10 MG tablet TAKE 1 TABLET EVERY DAY 90 tablet 1  . NITROSTAT 0.4 MG SL tablet Place 1 tablet (0.4 mg total) under the tongue every 5 (five) minutes as needed for chest pain. Reported on 10/20/2015 25 tablet 2  . pantoprazole (PROTONIX) 20 MG tablet TAKE 1 TABLET EVERY DAY 90 tablet 1  . QUNOL COQ10/UBIQUINOL/MEGA PO Take 5 mLs by mouth daily.    . rosuvastatin (CRESTOR) 20 MG tablet Take 1 tablet (20 mg total) by mouth daily. 90 tablet 3   No current facility-administered medications on file prior to visit.    Allergies  Allergen Reactions  . Statins Other (See Comments)    Muscle aches   . Codeine Other (See Comments)    Bleeding in urine    Social History   Socioeconomic History  . Marital status: Divorced    Spouse name: Not on file  . Number of children: Not on file  . Years of education: Not on file  . Highest education level: Not on file  Occupational History  . Occupation: retired   Tobacco Use  . Smoking status: Never Smoker  . Smokeless tobacco: Never Used  Substance and Sexual Activity  . Alcohol use: No    Alcohol/week: 0.0 standard drinks  . Drug use: No  . Sexual activity: Never  Other Topics Concern  . Not on file  Social History Narrative  . Not on file    Social Determinants of Health   Financial Resource Strain:   . Difficulty of Paying Living Expenses: Not on file  Food Insecurity:   . Worried About Charity fundraiser in the Last Year: Not on file  . Ran Out of Food in the Last Year: Not on file  Transportation Needs:   . Lack of Transportation (Medical): Not on file  . Lack of Transportation (Non-Medical): Not on file  Physical Activity:   . Days of Exercise per Week: Not on file  .  Minutes of Exercise per Session: Not on file  Stress:   . Feeling of Stress : Not on file  Social Connections:   . Frequency of Communication with Friends and Family: Not on file  . Frequency of Social Gatherings with Friends and Family: Not on file  . Attends Religious Services: Not on file  . Active Member of Clubs or Organizations: Not on file  . Attends Banker Meetings: Not on file  . Marital Status: Not on file  Intimate Partner Violence:   . Fear of Current or Ex-Partner: Not on file  . Emotionally Abused: Not on file  . Physically Abused: Not on file  . Sexually Abused: Not on file    Family History  Problem Relation Age of Onset  . Heart disease Mother   . Heart disease Father   . Heart disease Brother     Past Surgical History:  Procedure Laterality Date  . CARDIAC CATHETERIZATION  04/20/2016  . CARDIAC CATHETERIZATION N/A 04/20/2016   Procedure: Left Heart Cath and Cors/Grafts Angiography;  Surgeon: Orpah Cobb, MD;  Location: [facility];  Service: Cardiovascular;  Laterality: N/A;  . CARDIAC CATHETERIZATION Right 04/09/2018  . CORONARY ARTERY BYPASS GRAFT  2002   Quadruple Bypass in [place]  . LEFT HEART CATH AND CORS/GRAFTS ANGIOGRAPHY N/A 04/09/2018   Procedure: LEFT HEART CATH AND CORS/GRAFTS ANGIOGRAPHY;  Surgeon: Orpah Cobb, MD;  Location: [facility] CV [facility];  Service: Cardiovascular;  Laterality: N/A;    ROS: Review of Systems Negative except as stated above  PHYSICAL EXAM: BP (!)  139/92   Pulse 75   Resp 16   Wt 175 lb 9.6 oz (79.7 kg)   SpO2 96%   BMI 30.14 kg/m   Physical Exam  General appearance - alert, well appearing, elderly female and in no cardiopulmonary distress.  Initially patient was very upset due to the long wait as stated above Nose - normal and patent, no erythema, discharge or polyps Mouth - mucous membranes moist, pharynx normal without lesions Neck - supple, no significant adenopathy Chest - clear to auscultation, no wheezes, rales or rhonchi, symmetric air entry Heart - normal rate, regular rhythm, normal S1, S2, no murmurs, rubs, clicks or gallops Extremities -no lower extremity edema  CMP Latest Ref Rng & Units 03/11/2019 04/10/2018 04/09/2018  Glucose 65 - 99 mg/dL 235(T) 614(E) 99  BUN 8 - 27 mg/dL 7(L) 12 10  Creatinine 0.57 - 1.00 mg/dL 3.15 4.00 8.67  Sodium 134 - 144 mmol/L 144 138 136  Potassium 3.5 - 5.2 mmol/L 4.3 4.1 4.1  Chloride 96 - 106 mmol/L 104 105 102  CO2 20 - 29 mmol/L 27 25 25   Calcium 8.7 - 10.3 mg/dL 9.7 ) 8.9  Total Protein 6.0 - 8.5 g/dL 6.8 - -  Total Bilirubin 0.0 - 1.2 mg/dL 0.7 - -  Alkaline Phos 39 - 117 IU/L 60 - -  AST 0 - 40 IU/L 11 - -  ALT 0 - 32 IU/L 9 - -   Lipid Panel     Component Value Date/Time   CHOL 134 03/11/2019 0914   TRIG 121 03/11/2019 0914   HDL 43 03/11/2019 0914   CHOLHDL 3.1 03/11/2019 0914   CHOLHDL 2.8 04/09/2018 0009   VLDL 18 04/09/2018 0009   LDLCALC 69 03/11/2019 0914    CBC    Component Value Date/Time   WBC 6.5 03/11/2019 0914   WBC 6.7 04/10/2018 0356   RBC 4.57 03/11/2019  0914   RBC 4.51 04/10/2018 0356   HGB 12.6 03/11/2019 0914   HCT 39.8 03/11/2019 0914   PLT 214 03/11/2019 0914   MCV 87 03/11/2019 0914   MCH 27.6 03/11/2019 0914   MCH 27.5 04/10/2018 0356   MCHC 31.7 03/11/2019 0914   MCHC 31.5 04/10/2018 0356   RDW 14.2 03/11/2019 0914   LYMPHSABS 2.9 04/19/2016 1559   MONOABS 0.7 04/19/2016 1559   EOSABS 0.7 04/19/2016 1559   BASOSABS 0.1  04/19/2016 1559   Results for orders placed or performed in visit on 08/07/19  POCT glucose (manual entry)  Result Value Ref Range   POC Glucose 104 (A) 70 - 99 mg/dl    ASSESSMENT AND PLAN:  1. Seasonal allergies I recommend taking Claritin daily as needed.  This can be purchased over-the-counter.  2. Pain of left upper extremity Significantly improved since last visit.  She takes tizanidine as needed  3. Essential hypertension Not at goal today but patient was emotionally upset.  Home blood pressure readings have been good.  No changes made in medications  4. Coronary artery disease of autologous vein bypass graft with stable angina pectoris (HCC) Followed by cardiology.  Continue medications per cardiology  5. Prediabetes Continue Metformin - POCT glucose (manual entry)  6. Need for shingles vaccine Given    Patient was given the opportunity to ask questions.  Patient verbalized understanding of the plan and was able to repeat key elements of the plan.   Orders Placed This Encounter  Procedures  . Varicella-zoster vaccine IM  . POCT glucose (manual entry)     Requested Prescriptions    No prescriptions requested or ordered in this encounter    Return in about 3 months (around 11/04/2019) for in person.  Jonah Blue, MD, FACP

## 2019-08-10 ENCOUNTER — Telehealth: Payer: Self-pay | Admitting: Internal Medicine

## 2019-08-10 NOTE — Telephone Encounter (Signed)
Gm Patient called requesting Dr Laural Benes to call her regarding private matter she didn't want to say .

## 2019-08-10 NOTE — Telephone Encounter (Signed)
Pt states she already has spoken to someone

## 2019-08-18 DIAGNOSIS — Z951 Presence of aortocoronary bypass graft: Secondary | ICD-10-CM | POA: Diagnosis not present

## 2019-08-18 DIAGNOSIS — R072 Precordial pain: Secondary | ICD-10-CM | POA: Diagnosis not present

## 2019-08-18 DIAGNOSIS — I2510 Atherosclerotic heart disease of native coronary artery without angina pectoris: Secondary | ICD-10-CM | POA: Diagnosis not present

## 2019-08-18 DIAGNOSIS — I10 Essential (primary) hypertension: Secondary | ICD-10-CM | POA: Diagnosis not present

## 2019-08-26 ENCOUNTER — Other Ambulatory Visit: Payer: Self-pay | Admitting: Internal Medicine

## 2019-08-26 DIAGNOSIS — M5442 Lumbago with sciatica, left side: Secondary | ICD-10-CM

## 2019-08-26 DIAGNOSIS — G8929 Other chronic pain: Secondary | ICD-10-CM

## 2019-08-26 MED ORDER — TRAMADOL HCL 50 MG PO TABS: 50.0000 mg | ORAL_TABLET | Freq: Two times a day (BID) | ORAL | 1 refills | Status: DC | PRN

## 2019-09-03 ENCOUNTER — Ambulatory Visit: Payer: Medicare HMO | Attending: Internal Medicine

## 2019-09-03 DIAGNOSIS — Z23 Encounter for immunization: Secondary | ICD-10-CM | POA: Insufficient documentation

## 2019-09-03 NOTE — Progress Notes (Signed)
   Covid-19 Vaccination Clinic  Name:  [name]    MRN: [id number] DOB: [birth year]  09/03/2019  [name] was observed post Covid-19 immunization for 15 minutes without incident. She was provided with Vaccine Information Sheet and instruction to access the V-Safe system.   [name] was instructed to call 911 with any severe reactions post vaccine: Marland Kitchen Difficulty breathing  . Swelling of face and throat  . A fast heartbeat  . A bad rash all over body  . Dizziness and weakness   Immunizations Administered    Name Date Dose VIS Date Route   Pfizer COVID-19 Vaccine 09/03/2019 11:10 AM 0.3 mL 06/12/2019 Intramuscular   Manufacturer: [employer], [employer]   Lot: [id number]   NDC: [address]-9

## 2019-09-23 ENCOUNTER — Other Ambulatory Visit: Payer: Self-pay | Admitting: Internal Medicine

## 2019-09-23 DIAGNOSIS — M5442 Lumbago with sciatica, left side: Secondary | ICD-10-CM

## 2019-09-23 DIAGNOSIS — G8929 Other chronic pain: Secondary | ICD-10-CM

## 2019-09-29 ENCOUNTER — Ambulatory Visit: Payer: Medicare HMO | Attending: Internal Medicine

## 2019-09-29 DIAGNOSIS — Z23 Encounter for immunization: Secondary | ICD-10-CM

## 2019-09-29 NOTE — Progress Notes (Signed)
   Covid-19 Vaccination Clinic  Name:  [name]    MRN: [id number] DOB: 08/08/1945  09/29/2019  [name] was observed post Covid-19 immunization for 15 minutes without incident. She was provided with Vaccine Information Sheet and instruction to access the V-Safe system.   [name] was instructed to call 911 with any severe reactions post vaccine: Marland Kitchen Difficulty breathing  . Swelling of face and throat  . A fast heartbeat  . A bad rash all over body  . Dizziness and weakness   Immunizations Administered    Name Date Dose VIS Date Route   Pfizer COVID-19 Vaccine 09/29/2019  2:01 PM 0.3 mL 06/12/2019 Intramuscular   Manufacturer: [employer], [employer]   Lot: [id number]   NDC: [address]-0

## 2019-10-13 DIAGNOSIS — I2510 Atherosclerotic heart disease of native coronary artery without angina pectoris: Secondary | ICD-10-CM | POA: Diagnosis not present

## 2019-10-13 DIAGNOSIS — R072 Precordial pain: Secondary | ICD-10-CM | POA: Diagnosis not present

## 2019-10-13 DIAGNOSIS — I10 Essential (primary) hypertension: Secondary | ICD-10-CM | POA: Diagnosis not present

## 2019-10-13 DIAGNOSIS — Z951 Presence of aortocoronary bypass graft: Secondary | ICD-10-CM | POA: Diagnosis not present

## 2019-10-14 ENCOUNTER — Other Ambulatory Visit: Payer: Self-pay | Admitting: Internal Medicine

## 2019-10-14 DIAGNOSIS — K219 Gastro-esophageal reflux disease without esophagitis: Secondary | ICD-10-CM

## 2019-10-14 DIAGNOSIS — Z9109 Other allergy status, other than to drugs and biological substances: Secondary | ICD-10-CM

## 2019-11-12 ENCOUNTER — Other Ambulatory Visit: Payer: Self-pay

## 2019-11-12 ENCOUNTER — Ambulatory Visit: Payer: Medicare HMO | Attending: Internal Medicine | Admitting: Internal Medicine

## 2019-11-12 ENCOUNTER — Encounter: Payer: Self-pay | Admitting: Internal Medicine

## 2019-11-12 VITALS — BP 126/78 | HR 72 | Temp 97.2°F | Resp 16 | Wt 174.4 lb

## 2019-11-12 DIAGNOSIS — G8929 Other chronic pain: Secondary | ICD-10-CM | POA: Diagnosis not present

## 2019-11-12 DIAGNOSIS — M5442 Lumbago with sciatica, left side: Secondary | ICD-10-CM | POA: Diagnosis not present

## 2019-11-12 DIAGNOSIS — R0982 Postnasal drip: Secondary | ICD-10-CM

## 2019-11-12 DIAGNOSIS — R7303 Prediabetes: Secondary | ICD-10-CM | POA: Diagnosis not present

## 2019-11-12 DIAGNOSIS — I25718 Atherosclerosis of autologous vein coronary artery bypass graft(s) with other forms of angina pectoris: Secondary | ICD-10-CM | POA: Diagnosis not present

## 2019-11-12 DIAGNOSIS — L853 Xerosis cutis: Secondary | ICD-10-CM

## 2019-11-12 LAB — GLUCOSE, POCT (MANUAL RESULT ENTRY): POC Glucose: 106 mg/dl — AB (ref 70–99)

## 2019-11-12 MED ORDER — FLUTICASONE PROPIONATE 50 MCG/ACT NA SUSP: 1.0000 | Freq: Every day | NASAL | 2 refills | Status: AC

## 2019-11-12 NOTE — Patient Instructions (Signed)
You can use Curel Lotion or Castor Oil for dry skin.  Use Flonase with Claritin for the post nasal drip.   Postnasal Drip Postnasal drip is the feeling of mucus going down the back of your throat. Mucus is a slimy substance that moistens and cleans your nose and throat, as well as the air pockets in face bones near your forehead and cheeks (sinuses). Small amounts of mucus pass from your nose and sinuses down the back of your throat all the time. This is normal. When you produce too much mucus or the mucus gets too thick, you can feel it. Some common causes of postnasal drip include:  Having more mucus because of: ? A cold or the flu. ? Allergies. ? Cold air. ? Certain medicines.  Having more mucus that is thicker because of: ? A sinus or nasal infection. ? Dry air. ? A food allergy. Follow these instructions at home: Relieving discomfort   Gargle with a salt-water mixture 3-4 times a day or as needed. To make a salt-water mixture, completely dissolve -1 tsp of salt in 1 cup of warm water.  If the air in your home is dry, use a humidifier to add moisture to the air.  Use a saline spray or container (neti pot) to flush out the nose (nasal irrigation). These methods can help clear away mucus and keep the nasal passages moist. General instructions  Take over-the-counter and prescription medicines only as told by your health care provider.  Follow instructions from your health care provider about eating or drinking restrictions. You may need to avoid caffeine.  Avoid things that you know you are allergic to (allergens), like dust, mold, pollen, pets, or certain foods.  Drink enough fluid to keep your urine pale yellow.  Keep all follow-up visits as told by your health care provider. This is important. Contact a health care provider if:  You have a fever.  You have a sore throat.  You have difficulty swallowing.  You have headache.  You have sinus pain.  You have a  cough that does not go away.  The mucus from your nose becomes thick and is green or yellow in color.  You have cold or flu symptoms that last more than 10 days. Summary  Postnasal drip is the feeling of mucus going down the back of your throat.  If your health care provider approves, use nasal irrigation or a nasal spray 2?4 times a day.  Avoid things that you know you are allergic to (allergens), like dust, mold, pollen, pets, or certain foods. This information is not intended to replace advice given to you by your health care provider. Make sure you discuss any questions you have with your health care provider. Document Revised: 10/10/2018 Document Reviewed: 10/01/2016 Elsevier Patient Education  2020 [employer].

## 2019-11-12 NOTE — Progress Notes (Signed)
Patient ID: [name], female    DOB: [birth year]  MRN: [id number]  CC: Diabetes (prediabetes ) and Hypertension   Subjective: [name] is a 74 y.o. female who presents for chronic ds management Her concerns today include:  Patient with history of CAD s/p CADG x 4 (MI fall 2044for medical management, Dr.Kadakia), HTN, HLD, chronic LBP (LT sided sciaticaon tramadol), GERD, / asthma, preDM, Vit B 12 def.  Patient complains of postnasal drip symptoms.  States that she gets a lot of drainage from her head to the back of her throat.  This is associated with coughing and frequent throat clearing.  She has allergies which is worse during pollen season for which she has to take albuterol inhaler.  She is on Singulair and also uses Claritin over-the-counter.  She tells me that she was on Flonase nasal spray in the past and wonders whether that would be helpful for her current symptoms.    Complains of itching and burning of the lower legs especially over the shin.  Skin is very dry.  She tried using coconut oil and regular Vaseline after baths without relief.  She recently purchased some petroleum jelly that also has vitamin D and aloe vera mixed in with it.  This has given much better relief.  She did not put any on the legs today just so that I can take a look at the skin.    CAD: Followed by her cardiologist regularly.  She has a follow-up appointment next week.  Reports compliance with medications.  She has stable angina symptoms.  Back pain : Doing well on tramadol which she uses as needed.  Tolerating the medication well without significant side effects.  Prediabetes: She is doing well on Metformin.  She tries to be careful with her eating habits. Patient Active Problem List   Diagnosis Date Noted  . Acute coronary syndrome (HCC) 04/08/2018  . Prediabetes 05/06/2017  . Psoriasis of scalp 04/27/2016  . CAD (coronary atherosclerotic disease) 04/27/2016  . Lumbago with sciatica,  left side 03/10/2016  . Eczematous dermatitis of eyelid 10/20/2015  . Hyperlipidemia 05/09/2015  . Environmental allergies 05/02/2015  . GERD (gastroesophageal reflux disease) 05/02/2015  . Insomnia 05/02/2015  . Arthralgia 05/02/2015  . Hypertension   . History of MI (myocardial infarction)      Current Outpatient Medications on File Prior to Visit  Medication Sig Dispense Refill  . montelukast (SINGULAIR) 10 MG tablet TAKE 1 TABLET EVERY DAY 90 tablet 0  . pantoprazole (PROTONIX) 20 MG tablet TAKE 1 TABLET EVERY DAY 90 tablet 0  . albuterol (VENTOLIN HFA) 108 (90 Base) MCG/ACT inhaler INHALE 1 TO 2 PUFFS INTO THE LUNGS EVERY 6 (SIX) HOURS AS NEEDED FOR WHEEZING OR SHORTNESS OF BREATH. 18 g 2  . aspirin 81 MG tablet Take 1 tablet (81 mg total) by mouth daily. 90 tablet 3  . calcium carbonate (OS-CAL) 1250 (500 Ca) MG chewable tablet Chew 1 tablet by mouth daily.    . cholecalciferol (VITAMIN D) 1000 units tablet Take 1,000 Units by mouth daily.    . clopidogrel (PLAVIX) 75 MG tablet Take 1 tablet (75 mg total) by mouth daily. 30 tablet 3  . clotrimazole-betamethasone (LOTRISONE) cream APPLY TO AFFECTED AREA TWICE DAILY 45 g 1  . diltiazem (CARDIZEM) 60 MG tablet Take 60 mg by mouth 3 (three) times daily.    . enalapril (VASOTEC) 20 MG tablet Take 1 tablet (20 mg total) by mouth daily. 90 tablet 0  .  isosorbide mononitrate (IMDUR) 60 MG 24 hr tablet Take 120 mg by mouth daily.   3  . KRILL OIL PO Take 1 capsule by mouth daily.    . metFORMIN (GLUCOPHAGE) 500 MG tablet Take 1 tablet (500 mg total) by mouth 2 (two) times daily. 180 tablet 3  . metoprolol succinate (TOPROL-XL) 100 MG 24 hr tablet Take 1 tablet (100 mg total) by mouth daily. Take with or immediately following a meal. 90 tablet 0  . NITROSTAT 0.4 MG SL tablet Place 1 tablet (0.4 mg total) under the tongue every 5 (five) minutes as needed for chest pain. Reported on 10/20/2015 25 tablet 2  . QUNOL COQ10/UBIQUINOL/MEGA PO Take 5  mLs by mouth daily.    . rosuvastatin (CRESTOR) 20 MG tablet Take 1 tablet (20 mg total) by mouth daily. 90 tablet 3  . traMADol (ULTRAM) 50 MG tablet Take 1 tablet (50 mg total) by mouth 2 (two) times daily as needed (Each prescription to last a month.  Fill on or after 10/22/2019). 60 tablet 1   No current facility-administered medications on file prior to visit.    Allergies  Allergen Reactions  . Statins Other (See Comments)    Muscle aches   . Codeine Other (See Comments)    Bleeding in urine    Social History   Socioeconomic History  . Marital status: Divorced    Spouse name: Not on file  . Number of children: Not on file  . Years of education: Not on file  . Highest education level: Not on file  Occupational History  . Occupation: retired   Tobacco Use  . Smoking status: Never Smoker  . Smokeless tobacco: Never Used  Substance and Sexual Activity  . Alcohol use: No    Alcohol/week: 0.0 standard drinks  . Drug use: No  . Sexual activity: Never  Other Topics Concern  . Not on file  Social History Narrative  . Not on file   Social Determinants of Health   Financial Resource Strain:   . Difficulty of Paying Living Expenses:   Food Insecurity:   . Worried About Programme researcher, broadcasting/film/video in the Last Year:   . Barista in the Last Year:   Transportation Needs:   . Freight forwarder (Medical):   Marland Kitchen Lack of Transportation (Non-Medical):   Physical Activity:   . Days of Exercise per Week:   . Minutes of Exercise per Session:   Stress:   . Feeling of Stress :   Social Connections:   . Frequency of Communication with Friends and Family:   . Frequency of Social Gatherings with Friends and Family:   . Attends Religious Services:   . Active Member of Clubs or Organizations:   . Attends Banker Meetings:   Marland Kitchen Marital Status:   Intimate Partner Violence:   . Fear of Current or Ex-Partner:   . Emotionally Abused:   Marland Kitchen Physically Abused:   . Sexually  Abused:     Family History  Problem Relation Age of Onset  . Heart disease Mother   . Heart disease Father   . Heart disease Brother     Past Surgical History:  Procedure Laterality Date  . CARDIAC CATHETERIZATION  04/20/2016  . CARDIAC CATHETERIZATION N/A 04/20/2016   Procedure: Left Heart Cath and Cors/Grafts Angiography;  Surgeon: Orpah Cobb, MD;  Location: [facility];  Service: Cardiovascular;  Laterality: N/A;  . CARDIAC CATHETERIZATION Right 04/09/2018  .  CORONARY ARTERY BYPASS GRAFT  2002   Quadruple Bypass in [place]  . LEFT HEART CATH AND CORS/GRAFTS ANGIOGRAPHY N/A 04/09/2018   Procedure: LEFT HEART CATH AND CORS/GRAFTS ANGIOGRAPHY;  Surgeon: Orpah Cobb, MD;  Location: [facility];  Service: Cardiovascular;  Laterality: N/A;    ROS: Review of Systems Negative except as stated above  PHYSICAL EXAM: BP 126/78   Pulse 72   Temp (!) 97.2 F (36.2 C)   Resp 16   Wt 174 lb 6.4 oz (79.1 kg)   SpO2 96%   BMI 29.94 kg/m   Physical Exam  General appearance - alert, well appearing, and in no distress Mental status - normal mood, behavior, speech, dress, motor activity, and thought processes Nose: No enlargement of nasal turbinates. Mouth: No oral lesions throat is clear without erythema.  No mucus drainage noted at the back of the throat. Neck - supple, no significant adenopathy Chest - clear to auscultation, no wheezes, rales or rhonchi, symmetric air entry Heart - normal rate, regular rhythm, normal S1, S2, no murmurs, rubs, clicks or gallops Extremities - peripheral pulses normal, no pedal edema, no clubbing or cyanosis Skin: Skin on the lower legs are dry with some fissuring.  Depression screen [facility] 2/9 06/10/2018 03/13/2018 12/10/2017  Decreased Interest 0 0 0  Down, Depressed, Hopeless 0 0 0  PHQ - 2 Score 0 0 0  Altered sleeping - - -  Tired, decreased energy - - -  Change in appetite - - -  Feeling bad or failure about yourself  - - -   Trouble concentrating - - -  Moving slowly or fidgety/restless - - -  Suicidal thoughts - - -  PHQ-9 Score - - -    CMP Latest Ref Rng & Units 03/11/2019 04/10/2018 04/09/2018  Glucose 65 - 99 mg/dL 154(M) 086(P) 99  BUN 8 - 27 mg/dL 7(L) 12 10  Creatinine 0.57 - 1.00 mg/dL 6.19 5.09 3.26  Sodium 134 - 144 mmol/L 144 138 136  Potassium 3.5 - 5.2 mmol/L 4.3 4.1 4.1  Chloride 96 - 106 mmol/L 104 105 102  CO2 20 - 29 mmol/L 27 25 25   Calcium 8.7 - 10.3 mg/dL 9.7 ) 8.9  Total Protein 6.0 - 8.5 g/dL 6.8 - -  Total Bilirubin 0.0 - 1.2 mg/dL 0.7 - -  Alkaline Phos 39 - 117 IU/L 60 - -  AST 0 - 40 IU/L 11 - -  ALT 0 - 32 IU/L 9 - -   Lipid Panel     Component Value Date/Time   CHOL 134 03/11/2019 0914   TRIG 121 03/11/2019 0914   HDL 43 03/11/2019 0914   CHOLHDL 3.1 03/11/2019 0914   CHOLHDL 2.8 04/09/2018 0009   VLDL 18 04/09/2018 0009   LDLCALC 69 03/11/2019 0914    CBC    Component Value Date/Time   WBC 6.5 03/11/2019 0914   WBC 6.7 04/10/2018 0356   RBC 4.57 03/11/2019 0914   RBC 4.51 04/10/2018 0356   HGB 12.6 03/11/2019 0914   HCT 39.8 03/11/2019 0914   PLT 214 03/11/2019 0914   MCV 87 03/11/2019 0914   MCH 27.6 03/11/2019 0914   MCH 27.5 04/10/2018 0356   MCHC 31.7 03/11/2019 0914   MCHC 31.5 04/10/2018 0356   RDW 14.2 03/11/2019 0914   LYMPHSABS 2.9 04/19/2016 1559   MONOABS 0.7 04/19/2016 1559   EOSABS 0.7 04/19/2016 1559   BASOSABS 0.1 04/19/2016 1559    ASSESSMENT AND  PLAN: 1. Postnasal drip Continue Claritin.  We will add Flonase nasal spray.  I told her to use the Flonase daily for the first week and then after that as needed.  Advised that Flonase can cause nosebleeds - fluticasone (FLONASE) 50 MCG/ACT nasal spray; Place 1 spray into both nostrils daily.  Dispense: 11.1 mL; Refill: 2  2. Dry skin She can continue the petroleum compound that she is currently using.  Other recommendations include Curel lotion or castor oil to apply after coming out  of the shower  3. Prediabetes Continue Metformin and try to eat healthy - POCT glucose (manual entry)  4. Coronary artery disease of autologous vein bypass graft with stable angina pectoris (HCC) Stable on current medications.  Followed by cardiology  5. Chronic left-sided low back pain with left-sided sciatica Stable.  No recent falls.  Continue tramadol as needed.  I forgot to have her update the controlled substance prescribing agreement today.  We will have her do this on a follow-up visit.     Patient was given the opportunity to ask questions.  Patient verbalized understanding of the plan and was able to repeat key elements of the plan.   Orders Placed This Encounter  Procedures  . POCT glucose (manual entry)     Requested Prescriptions    No prescriptions requested or ordered in this encounter    No follow-ups on file.  Karle Plumber, MD, FACP

## 2019-11-20 ENCOUNTER — Other Ambulatory Visit: Payer: Self-pay | Admitting: Internal Medicine

## 2019-11-20 DIAGNOSIS — L309 Dermatitis, unspecified: Secondary | ICD-10-CM

## 2019-11-24 DIAGNOSIS — Z951 Presence of aortocoronary bypass graft: Secondary | ICD-10-CM | POA: Diagnosis not present

## 2019-11-24 DIAGNOSIS — I2510 Atherosclerotic heart disease of native coronary artery without angina pectoris: Secondary | ICD-10-CM | POA: Diagnosis not present

## 2019-11-24 DIAGNOSIS — I10 Essential (primary) hypertension: Secondary | ICD-10-CM | POA: Diagnosis not present

## 2019-11-24 DIAGNOSIS — R072 Precordial pain: Secondary | ICD-10-CM | POA: Diagnosis not present

## 2019-12-14 ENCOUNTER — Other Ambulatory Visit: Payer: Self-pay | Admitting: Internal Medicine

## 2019-12-14 DIAGNOSIS — K219 Gastro-esophageal reflux disease without esophagitis: Secondary | ICD-10-CM

## 2019-12-14 DIAGNOSIS — Z9109 Other allergy status, other than to drugs and biological substances: Secondary | ICD-10-CM

## 2019-12-17 ENCOUNTER — Other Ambulatory Visit: Payer: Self-pay | Admitting: Internal Medicine

## 2019-12-17 DIAGNOSIS — M5442 Lumbago with sciatica, left side: Secondary | ICD-10-CM

## 2019-12-17 DIAGNOSIS — G8929 Other chronic pain: Secondary | ICD-10-CM

## 2020-01-01 ENCOUNTER — Other Ambulatory Visit: Payer: Self-pay | Admitting: Internal Medicine

## 2020-01-01 NOTE — Telephone Encounter (Signed)
   Future visit scheduled: yes  Notes to clinic:  not on current medication list Review for refill   Requested Prescriptions  Pending Prescriptions Disp Refills   tiZANidine (ZANAFLEX) 2 MG tablet [Pharmacy Med Name: tizanidine 2 mg tablet] 30 tablet 1    Sig: TAKE ONE TABLET BY MOUTH AT BEDTIME AS NEEDED      Not Delegated - Cardiovascular:  Alpha-2 Agonists - tizanidine Failed - 01/01/2020 12:49 PM      Failed - This refill cannot be delegated      Passed - Valid encounter within last 6 months    Recent Outpatient Visits           1 month ago Postnasal drip   [facility]ar, [facility]   4 months ago Seasonal allergies   [place] [facility] Marcine Matar, MD   6 months ago Constipation, unspecified constipation type   [facility] Marcine Matar, MD   10 months ago Prediabetes   [facility] Marcine Matar, MD   1 year ago Prediabetes   [facility] Marcine Matar, MD       Future Appointments             In 1 month Laural Benes, Binnie Rail, MD [facility]

## 2020-01-13 ENCOUNTER — Other Ambulatory Visit: Payer: Self-pay | Admitting: Internal Medicine

## 2020-01-13 NOTE — Telephone Encounter (Signed)
Requested medication (s) are due for refill today?- no  Requested medication (s) are on the active medication list -no  Future visit scheduled -yes  Last refill: unsure  Notes to clinic: Request for medication not current on list and non delegated- already refused 1 week ago  Requested Prescriptions  Pending Prescriptions Disp Refills   tiZANidine (ZANAFLEX) 2 MG tablet [Pharmacy Med Name: tizanidine 2 mg tablet] 30 tablet 1    Sig: TAKE ONE TABLET BY MOUTH AT BEDTIME AS NEEDED      Not Delegated - Cardiovascular:  Alpha-2 Agonists - tizanidine Failed - 01/13/2020 11:06 AM      Failed - This refill cannot be delegated      Passed - Valid encounter within last 6 months    Recent Outpatient Visits           2 months ago Postnasal drip   [facility] Marc[facility]ar, MD   5 months ago Seasonal allergies   [facility] Marcine Matar, MD   7 months ago Constipation, unspecified constipation type   [facility] Marcine Matar, MD   10 months ago Prediabetes   [facility] Marcine Matar, MD   1 year ago Prediabetes   [place] Marcine Matar, MD       Future Appointments             In 1 month Laural Benes, Binnie Rail, MD [facility]                Requested Prescriptions  Pending Prescriptions Disp Refills   tiZANidine (ZANAFLEX) 2 MG tablet [Pharmacy Med Name: tizanidine 2 mg tablet] 30 tablet 1    Sig: TAKE ONE TABLET BY MOUTH AT BEDTIME AS NEEDED      Not Delegated - Cardiovascular:  Alpha-2 Agonists - tizanidine Failed - 01/13/2020 11:06 AM      Failed - This refill cannot be delegated      Passed - Valid encounter within last 6 months    Recent Outpatient Visits           2 months ago Postnasal drip   [place] [facility] Marcine Matar, MD    5 months ago Seasonal allergies   [place] [facility] Marcine Matar, MD   7 months ago Constipation, unspecified constipation type   [facility] Marcine Matar, MD   10 months ago Prediabetes   [facility] Marcine Matar, MD   1 year ago Prediabetes   [facility] Marcine Matar, MD       Future Appointments             In 1 month Laural Benes, Binnie Rail, MD [facility]

## 2020-01-14 ENCOUNTER — Other Ambulatory Visit: Payer: Self-pay | Admitting: Internal Medicine

## 2020-01-14 DIAGNOSIS — J4520 Mild intermittent asthma, uncomplicated: Secondary | ICD-10-CM

## 2020-01-18 ENCOUNTER — Other Ambulatory Visit: Payer: Self-pay | Admitting: Internal Medicine

## 2020-01-18 DIAGNOSIS — G8929 Other chronic pain: Secondary | ICD-10-CM

## 2020-01-18 NOTE — Telephone Encounter (Signed)
Requested medication (s) are due for refill today: pt has no refills left  Requested medication (s) are on the active medication list: yes  Last refill:  12/17/19  Future visit scheduled: yes  Notes to clinic:  med not delegated to NT to RF   Requested Prescriptions  Pending Prescriptions Disp Refills   traMADol (ULTRAM) 50 MG tablet [Pharmacy Med Name: tramadol 50 mg tablet] 60 tablet 1    Sig: Take 1 tablet (50 mg total) by mouth 2 (two) times daily as needed (Fill on or after 12/20/2019. Each prescription to last 1 month).      Not Delegated - Analgesics:  Opioid Agonists Failed - 01/18/2020  4:34 PM      Failed - This refill cannot be delegated      Failed - Urine Drug Screen completed in last 360 days.      Passed - Valid encounter within last 6 months    Recent Outpatient Visits           2 months ago Postnasal drip   [place] Marcine Matar, MD   5 months ago Seasonal allergies   [facility] Marcine Matar, MD   7 months ago Constipation, unspecified constipation type   [facility] Marcine Matar, MD   10 months ago Prediabetes   [facility] Marcine Matar, MD   1 year ago Prediabetes   [facility] Marcine Matar, MD       Future Appointments             In 3 weeks Marcine Matar, MD [facility]

## 2020-02-04 ENCOUNTER — Other Ambulatory Visit: Payer: Self-pay | Admitting: Internal Medicine

## 2020-02-04 DIAGNOSIS — J4520 Mild intermittent asthma, uncomplicated: Secondary | ICD-10-CM

## 2020-02-12 ENCOUNTER — Telehealth: Payer: Medicare HMO | Admitting: Internal Medicine

## 2020-02-12 ENCOUNTER — Telehealth: Payer: Self-pay | Admitting: Internal Medicine

## 2020-02-12 NOTE — Telephone Encounter (Signed)
Pt can be in person

## 2020-02-12 NOTE — Telephone Encounter (Signed)
Called to speak with the office manager regarding patient's medicare.  Please return call to Ms. Smith at [phone], ext. [phone]

## 2020-02-15 NOTE — Telephone Encounter (Signed)
[name] is calling again and would like to talk with office manager

## 2020-02-23 ENCOUNTER — Other Ambulatory Visit: Payer: Self-pay | Admitting: Internal Medicine

## 2020-02-23 DIAGNOSIS — Z9109 Other allergy status, other than to drugs and biological substances: Secondary | ICD-10-CM

## 2020-02-23 DIAGNOSIS — K219 Gastro-esophageal reflux disease without esophagitis: Secondary | ICD-10-CM

## 2020-02-23 NOTE — Telephone Encounter (Signed)
Requested Prescriptions  Pending Prescriptions Disp Refills   pantoprazole (PROTONIX) 20 MG tablet [Pharmacy Med Name: PANTOPRAZOLE SODIUM 20 MG Tablet Delayed Release] 90 tablet 3    Sig: TAKE 1 TABLET EVERY DAY     Gastroenterology: Proton Pump Inhibitors Passed - 02/23/2020  9:40 PM      Passed - Valid encounter within last 12 months    Recent Outpatient Visits          3 months ago Postnasal drip   [place] [facility] Marcine Matar, MD   6 months ago Seasonal allergies   [place] [facility] Marcine Matar, MD   8 months ago Constipation, unspecified constipation type   [facility] Marcine Matar, MD   11 months ago Prediabetes   [facility] Marcine Matar, MD   1 year ago Prediabetes   [facility] Marcine Matar, MD      Future Appointments            In 1 month Laural Benes, Binnie Rail, MD [facility]            montelukast (SINGULAIR) 10 MG tablet [Pharmacy Med Name: MONTELUKAST SODIUM 10 MG Tablet] 90 tablet 0    Sig: TAKE 1 TABLET EVERY DAY     Pulmonology:  Leukotriene Inhibitors Passed - 02/23/2020  9:40 PM      Passed - Valid encounter within last 12 months    Recent Outpatient Visits          3 months ago Postnasal drip   [place] [facility] Marcine Matar, MD   6 months ago Seasonal allergies   [facility] Marcine Matar, MD   8 months ago Constipation, unspecified constipation type   [facility] Marcine Matar, MD   11 months ago Prediabetes   [facility] Marcine Matar, MD   1 year ago Prediabetes   [facility] Marcine Matar, MD      Future Appointments            In 1 month Laural Benes, Binnie Rail, MD [facility]

## 2020-02-24 DIAGNOSIS — H524 Presbyopia: Secondary | ICD-10-CM | POA: Diagnosis not present

## 2020-02-24 DIAGNOSIS — H52203 Unspecified astigmatism, bilateral: Secondary | ICD-10-CM | POA: Diagnosis not present

## 2020-02-24 DIAGNOSIS — H04123 Dry eye syndrome of bilateral lacrimal glands: Secondary | ICD-10-CM | POA: Diagnosis not present

## 2020-02-24 DIAGNOSIS — Z961 Presence of intraocular lens: Secondary | ICD-10-CM | POA: Diagnosis not present

## 2020-02-28 ENCOUNTER — Other Ambulatory Visit: Payer: Self-pay

## 2020-02-28 ENCOUNTER — Emergency Department (HOSPITAL_COMMUNITY)
Admission: EM | Admit: 2020-02-28 | Discharge: 2020-02-28 | Disposition: A | Discharge status: Home / Self Care | Payer: Medicare HMO | Attending: Emergency Medicine | Admitting: Emergency Medicine

## 2020-02-28 ENCOUNTER — Encounter (HOSPITAL_COMMUNITY): Payer: Self-pay | Admitting: Emergency Medicine

## 2020-02-28 DIAGNOSIS — N3091 Cystitis, unspecified with hematuria: Secondary | ICD-10-CM | POA: Insufficient documentation

## 2020-02-28 DIAGNOSIS — N3001 Acute cystitis with hematuria: Secondary | ICD-10-CM

## 2020-02-28 DIAGNOSIS — I2510 Atherosclerotic heart disease of native coronary artery without angina pectoris: Secondary | ICD-10-CM | POA: Diagnosis not present

## 2020-02-28 DIAGNOSIS — Z8719 Personal history of other diseases of the digestive system: Secondary | ICD-10-CM | POA: Diagnosis not present

## 2020-02-28 DIAGNOSIS — Z7982 Long term (current) use of aspirin: Secondary | ICD-10-CM | POA: Insufficient documentation

## 2020-02-28 DIAGNOSIS — Z7984 Long term (current) use of oral hypoglycemic drugs: Secondary | ICD-10-CM | POA: Diagnosis not present

## 2020-02-28 DIAGNOSIS — I10 Essential (primary) hypertension: Secondary | ICD-10-CM | POA: Insufficient documentation

## 2020-02-28 DIAGNOSIS — Z79899 Other long term (current) drug therapy: Secondary | ICD-10-CM | POA: Diagnosis not present

## 2020-02-28 DIAGNOSIS — J45909 Unspecified asthma, uncomplicated: Secondary | ICD-10-CM | POA: Insufficient documentation

## 2020-02-28 DIAGNOSIS — R300 Dysuria: Secondary | ICD-10-CM | POA: Diagnosis present

## 2020-02-28 LAB — URINALYSIS, ROUTINE W REFLEX MICROSCOPIC
Bilirubin Urine: NEGATIVE
Glucose, UA: NEGATIVE mg/dL
Ketones, ur: NEGATIVE mg/dL
Nitrite: NEGATIVE
Protein, ur: NEGATIVE mg/dL
Specific Gravity, Urine: 1.002 — ABNORMAL LOW (ref 1.005–1.030)
pH: 8 (ref 5.0–8.0)

## 2020-02-28 MED ORDER — CEPHALEXIN 500 MG PO CAPS: 500.0000 mg | ORAL_CAPSULE | Freq: Three times a day (TID) | ORAL | 0 refills | Status: DC

## 2020-02-28 MED ORDER — CEPHALEXIN 250 MG PO CAPS
500.0000 mg | ORAL_CAPSULE | Freq: Once | ORAL | Status: AC
  Administered 2020-02-28: 500 mg via ORAL
  Filled 2020-02-28: qty 2

## 2020-02-28 NOTE — Discharge Instructions (Addendum)
You were seen in the emergency department today for a urinary tract infection.  We have sent her urine for culture.  We are sending home with Keflex, this is an antibiotic, please take this 3 times per day for the next 1 week to treat the infection.  Please take all of your antibiotics until finished. You may develop abdominal discomfort or diarrhea from the antibiotic.  You may help offset this with probiotics which you can buy at the store (ask your pharmacist if unable to find) or get probiotics in the form of eating yogurt. Do not eat or take the probiotics until 2 hours after your antibiotic. If you are unable to tolerate these side effects follow-up with your primary care provider or return to the emergency department.   If you begin to experience any blistering, rashes, swelling, or difficulty breathing seek medical care for evaluation of potentially more serious side effects.   Please be aware that this medication may interact with other medications you are taking, please be sure to discuss your medication list with your pharmacist. If you are taking birth control the antibiotic will deactivate your birth control for 2 weeks. If on coumadin the antibiotic will effect your coumadin level.   Please follow-up with your primary care provider within 3 days.  Return to the ER for new or worsening symptoms including but not limited to worsening pain, pain in your flank/back area, vomiting, fever, problems with bowel movements, or any other concerns.

## 2020-02-28 NOTE — ED Triage Notes (Signed)
Pt. Stated, I started having pain in my vagina area. Hurts to pee

## 2020-02-28 NOTE — ED Provider Notes (Signed)
[facility] EMERGENCY DEPARTMENT Provider Note   CSN: [id number] Arrival date & time: 02/28/20  0847     History Chief Complaint  Patient presents with  . Dysuria    Camaryn Lumbert is a 74 y.o. female with a history of CAD, hypertension, hypercholesterolemia, and GERD who presents to the emergency department with complaints of dysuria for the past 3 to 4 days.  Patient states she has dysuria, urgency, frequency, and some suprapubic discomfort.  She tried taking Pyridium over-the-counter which only made her nauseous, prior to this she was not having any nausea.  She has stopped taking this medication due to the side effect.  She denies fever, chills, flank pain, vaginal bleeding, vaginal discharge, diarrhea, melena, or constipation.  She states she has a history of recurrent UTIs and this feels exactly the same.  HPI     Past Medical History:  Diagnosis Date  . Asthma    "pollen, grass" (04/20/2016)  . Chronic lower back pain   . Coronary artery disease   . High cholesterol   . Hypertension Dx 1990  . Myocardial infarction (HCC) 2002  . Pneumonia 04/2016  . Sciatica 04/19/2016  . Sciatica     Patient Active Problem List   Diagnosis Date Noted  . Acute coronary syndrome (HCC) 04/08/2018  . Prediabetes 05/06/2017  . Psoriasis of scalp 04/27/2016  . CAD (coronary atherosclerotic disease) 04/27/2016  . Lumbago with sciatica, left side 03/10/2016  . Eczematous dermatitis of eyelid 10/20/2015  . Hyperlipidemia 05/09/2015  . Environmental allergies 05/02/2015  . GERD (gastroesophageal reflux disease) 05/02/2015  . Insomnia 05/02/2015  . Arthralgia 05/02/2015  . Hypertension   . History of MI (myocardial infarction)     Past Surgical History:  Procedure Laterality Date  . CARDIAC CATHETERIZATION  04/20/2016  . CARDIAC CATHETERIZATION N/A 04/20/2016   Procedure: Left Heart Cath and Cors/Grafts Angiography;  Surgeon: Orpah Cobb, MD;  Location: [facility]  INVASIVE CV LAB;  Service: Cardiovascular;  Laterality: N/A;  . CARDIAC CATHETERIZATION Right 04/09/2018  . CORONARY ARTERY BYPASS GRAFT  2002   Quadruple Bypass in [place]  . LEFT HEART CATH AND CORS/GRAFTS ANGIOGRAPHY N/A 04/09/2018   Procedure: LEFT HEART CATH AND CORS/GRAFTS ANGIOGRAPHY;  Surgeon: Orpah Cobb, MD;  Location: [facility] INVASIVE CV LAB;  Service: Cardiovascular;  Laterality: N/A;     OB History   No obstetric history on file.     Family History  Problem Relation Age of Onset  . Heart disease Mother   . Heart disease Father   . Heart disease Brother     Social History   Tobacco Use  . Smoking status: Never Smoker  . Smokeless tobacco: Never Used  Vaping Use  . Vaping Use: Never used  Substance Use Topics  . Alcohol use: No    Alcohol/week: 0.0 standard drinks  . Drug use: No    Home Medications Prior to Admission medications   Medication Sig Start Date End Date Taking? Authorizing Provider  traMADol (ULTRAM) 50 MG tablet 1 tab PO twice a day as needed.  Fill on or after 02/15/2020.  Each prescription to last 1 mth 01/27/20   Marcine Matar, MD  albuterol (VENTOLIN HFA) 108 (90 Base) MCG/ACT inhaler INHALE 1 TO 2 PUFFS EVERY 6 HOURS AS NEEDED FOR WHEEZING OR SHORTNESS OF BREATH 02/05/20   Marcine Matar, MD  aspirin 81 MG tablet Take 1 tablet (81 mg total) by mouth daily. 10/20/15   Dessa Phi, MD  calcium carbonate (OS-CAL) 1250 (500 Ca) MG chewable tablet Chew 1 tablet by mouth daily.    [provider]  cholecalciferol (VITAMIN D) 1000 units tablet Take 1,000 Units by mouth daily.    [provider]  clopidogrel (PLAVIX) 75 MG tablet Take 1 tablet (75 mg total) by mouth daily. 04/21/16   Rinaldo Cloud, MD  clotrimazole-betamethasone (LOTRISONE) cream APPLY TO AFFECTED AREA TWICE DAILY 11/20/19   Marcine Matar, MD  diltiazem (CARDIZEM) 60 MG tablet Take 60 mg by mouth 3 (three) times daily. 03/15/18   [provider]    enalapril (VASOTEC) 20 MG tablet Take 1 tablet (20 mg total) by mouth daily. 05/16/17   Marcine Matar, MD  fluticasone (FLONASE) 50 MCG/ACT nasal spray Place 1 spray into both nostrils daily. 11/12/19   Marcine Matar, MD  isosorbide mononitrate (IMDUR) 60 MG 24 hr tablet Take 120 mg by mouth daily.  10/29/17   [provider]  KRILL OIL PO Take 1 capsule by mouth daily.    [provider]  metFORMIN (GLUCOPHAGE) 500 MG tablet Take 1 tablet (500 mg total) by mouth 2 (two) times daily. 11/13/18   Marcine Matar, MD  metoprolol succinate (TOPROL-XL) 100 MG 24 hr tablet Take 1 tablet (100 mg total) by mouth daily. Take with or immediately following a meal. 10/10/17   Marcine Matar, MD  montelukast (SINGULAIR) 10 MG tablet TAKE 1 TABLET EVERY DAY 02/23/20   Marcine Matar, MD  NITROSTAT 0.4 MG SL tablet Place 1 tablet (0.4 mg total) under the tongue every 5 (five) minutes as needed for chest pain. Reported on 10/20/2015 09/04/16   Dessa Phi, MD  pantoprazole (PROTONIX) 20 MG tablet TAKE 1 TABLET EVERY DAY 02/23/20   Marcine Matar, MD  Theodoro Grist COQ10/UBIQUINOL/MEGA PO Take 5 mLs by mouth daily.    [provider]  rosuvastatin (CRESTOR) 20 MG tablet Take 1 tablet (20 mg total) by mouth daily. 01/17/17   Funches, Gerilyn Nestle, MD  tiZANidine (ZANAFLEX) 2 MG tablet TAKE ONE TABLET BY MOUTH AT BEDTIME AS NEEDED 01/13/20   Marcine Matar, MD    Allergies    Statins and Codeine  Review of Systems   Review of Systems  Constitutional: Negative for fever.  Respiratory: Negative for shortness of breath.   Cardiovascular: Negative for chest pain.  Gastrointestinal: Positive for abdominal pain (suprapubic discomfort. ) and nausea (w/ AZO, resolved). Negative for blood in stool, constipation, diarrhea and vomiting.  Genitourinary: Positive for dysuria, frequency and urgency. Negative for flank pain, hematuria, vaginal bleeding and vaginal discharge.   Neurological: Negative for syncope.  All other systems reviewed and are negative.   Physical Exam Updated Vital Signs BP 129/76 (BP Location: Right Arm)   Pulse 66   Temp 98.4 F (36.9 C) (Oral)   Resp 18   SpO2 98%   Physical Exam Vitals and nursing note reviewed.  Constitutional:      General: She is not in acute distress.    Appearance: She is well-developed. She is not toxic-appearing.  HENT:     Head: Normocephalic and atraumatic.  Eyes:     General:        Right eye: No discharge.        Left eye: No discharge.     Conjunctiva/sclera: Conjunctivae normal.  Cardiovascular:     Rate and Rhythm: Normal rate and regular rhythm.  Pulmonary:     Effort: Pulmonary effort is normal. No respiratory  distress.     Breath sounds: Normal breath sounds. No wheezing, rhonchi or rales.  Abdominal:     General: There is no distension.     Palpations: Abdomen is soft.     Tenderness: There is abdominal tenderness (Mild suprapubic.). There is no right CVA tenderness, left CVA tenderness, guarding or rebound.  Musculoskeletal:     Cervical back: Neck supple.  Skin:    General: Skin is warm and dry.     Findings: No rash.  Neurological:     Mental Status: She is alert.     Comments: Clear speech.   Psychiatric:        Behavior: Behavior normal.     ED Results / Procedures / Treatments   Labs (all labs ordered are listed, but only abnormal results are displayed) Labs Reviewed  URINALYSIS, ROUTINE W REFLEX MICROSCOPIC - Abnormal; Notable for the following components:      Result Value   Specific Gravity, Urine 1.002 (*)    Hgb urine dipstick MODERATE (*)    Leukocytes,Ua LARGE (*)    Bacteria, UA RARE (*)    Non Squamous Epithelial 0-5 (*)    All other components within normal limits  URINE CULTURE    EKG None  Radiology No results found.  Procedures Procedures (including critical care time)  Medications Ordered in ED Medications  cephALEXin (KEFLEX) capsule  500 mg (has no administration in time range)    ED Course  I have reviewed the triage vital signs and the nursing notes.  Pertinent labs & imaging results that were available during my care of the patient were reviewed by me and considered in my medical decision making (see chart for details).    MDM Rules/Calculators/A&P                          Patient presents to the emergency department with complaints of urinary symptoms with suprapubic pressure.  She is nontoxic, resting comfortably, her vitals are within normal limits.  She has minimal suprapubic tenderness to palpation without peritoneal signs, I have a low suspicion for acute surgical abdomen, especially given her urinary symptoms and urinalysis that does have some features concerning for UTI which she has a history of.  She is afebrile, is tolerating p.o., and does not have any flank pain or CVA tenderness, overall presentation does not seem concerning for pyelonephritis or sepsis at this time.  No prior urine cultures on record, we will send urine culture today, and treat the patient with Keflex for 1 week with close PCP follow-up and strict return precautions. I discussed results, treatment plan, need for follow-up, and return precautions with the patient. Provided opportunity for questions, patient confirmed understanding and is in agreement with plan.   Final Clinical Impression(s) / ED Diagnoses Final diagnoses:  Acute cystitis with hematuria    Rx / DC Orders ED Discharge Orders         Ordered    cephALEXin (KEFLEX) 500 MG capsule  3 times daily        02/28/20 1445           Cono Gebhard, [place], PA-C 02/28/20 1447    Tilden Fossa, MD 02/28/20 [phone]

## 2020-03-02 LAB — URINE CULTURE: Culture: >=100000 — AB

## 2020-03-03 ENCOUNTER — Telehealth: Payer: Self-pay | Admitting: *Deleted

## 2020-03-03 NOTE — Telephone Encounter (Signed)
Post ED Visit - Positive Culture Follow-up  Culture report reviewed by antimicrobial stewardship pharmacist: [facility] []  , Pharm.D. []  Enzo Bi, Pharm.D., BCPS AQ-ID []  , Pharm.D., BCPS []  Celedonio Miyamoto, Pharm.D., BCPS []  [place], Garvin Fila.D., BCPS, AAHIVP []  , Pharm.D., BCPS, AAHIVP []  Georgina Pillion, PharmD, BCPS []  , PharmD, BCPS []  [place], PharmD, BCPS []  [address], PharmD []  , PharmD, BCPS []  Estella Husk, PharmD  Pharmacy Team []  Lysle Pearl, PharmD []  , PharmD []  Phillips Climes, PharmD []  , Rph []  Agapito Games) , PharmD []  Verlan Friends, PharmD []  , PharmD []  Mervyn Gay, PharmD []  , PharmD []  Vinnie Level, PharmD []  [facility], PharmD []  , PharmD []  Len Childs, PharmD   Positive urine culture Treated with Cephalexin, organism sensitive to the same and no further patient follow-up is required at this time. , PharmD  Greer Pickerel Talley   03/03/2020, 10:29 AM

## 2020-03-14 ENCOUNTER — Encounter (INDEPENDENT_AMBULATORY_CARE_PROVIDER_SITE_OTHER): Payer: Medicare HMO | Admitting: Primary Care

## 2020-04-02 ENCOUNTER — Other Ambulatory Visit: Payer: Self-pay | Admitting: Internal Medicine

## 2020-04-02 DIAGNOSIS — G8929 Other chronic pain: Secondary | ICD-10-CM

## 2020-04-02 DIAGNOSIS — M5442 Lumbago with sciatica, left side: Secondary | ICD-10-CM

## 2020-04-02 NOTE — Telephone Encounter (Signed)
Requested medication (s) are due for refill today: yes  Requested medication (s) are on the active medication list: yes  Last refill:  01/27/20  Future visit scheduled: yes  Notes to clinic:  med not delegated to NT to RF   Requested Prescriptions  Pending Prescriptions Disp Refills   traMADol (ULTRAM) 50 MG tablet [Pharmacy Med Name: tramadol 50 mg tablet] 60 tablet 1    Sig: TAKE ONE TABLET BY MOUTH TWICE DAILY AS NEEDED. Fill ON OR AFTER 02/15/2020. Each prescription TO last ONE mth]      Not Delegated - Analgesics:  Opioid Agonists Failed - 04/02/2020  1:49 PM      Failed - This refill cannot be delegated      Failed - Urine Drug Screen completed in last 360 days.      Passed - Valid encounter within last 6 months    Recent Outpatient Visits           4 months ago Postnasal drip   [facility] Marcine Matar, MD   7 months ago Seasonal allergies   [facility] Marcine Matar, MD   9 months ago Constipation, unspecified constipation type   [place] [facility] Marcine Matar, MD   1 year ago Prediabetes   [facility] Marcine Matar, MD   1 year ago Prediabetes   [facility] Marcine Matar, MD       Future Appointments             In 1 week Marcine Matar, MD [facility]

## 2020-04-03 NOTE — Telephone Encounter (Signed)
Please Refill if indicated!

## 2020-04-12 ENCOUNTER — Telehealth: Payer: Medicare HMO | Admitting: Internal Medicine

## 2020-04-19 ENCOUNTER — Ambulatory Visit (INDEPENDENT_AMBULATORY_CARE_PROVIDER_SITE_OTHER): Payer: Medicare Other | Admitting: Primary Care

## 2020-06-01 ENCOUNTER — Other Ambulatory Visit: Payer: Self-pay | Admitting: Internal Medicine

## 2020-06-01 DIAGNOSIS — G8929 Other chronic pain: Secondary | ICD-10-CM

## 2020-06-01 NOTE — Telephone Encounter (Signed)
Requested medication (s) are due for refill today - no  Requested medication (s) are on the active medication list -yes  Future visit scheduled -yes  Last refill: 06/01/20  Notes to clinic: Request RF- non delegated rx  Requested Prescriptions  Pending Prescriptions Disp Refills   traMADol (ULTRAM) 50 MG tablet [Pharmacy Med Name: tramadol 50 mg tablet] 60 tablet 1    Sig: TAKE ONE TABLET BY MOUTH TWICE DAILY AS NEEDED. Fill ON OR AFTER 05/02/2920. Each prescription TO last ONE mth]      Not Delegated - Analgesics:  Opioid Agonists Failed - 06/01/2020  2:59 PM      Failed - This refill cannot be delegated      Failed - Urine Drug Screen completed in last 360 days      Failed - Valid encounter within last 6 months    Recent Outpatient Visits           6 months ago Postnasal drip   [place] [facility] Marcine Matar, MD   9 months ago Seasonal allergies   [facility] Marcine Matar, MD   11 months ago Constipation, unspecified constipation type   [facility] Marcine Matar, MD   1 year ago Prediabetes   [facility] Marcine Matar, MD   1 year ago Prediabetes   [facility] Marcine Matar, MD       Future Appointments             In 6 days Randa Evens, Kinnie Scales, NP [facility]                Requested Prescriptions  Pending Prescriptions Disp Refills   traMADol (ULTRAM) 50 MG tablet [Pharmacy Med Name: tramadol 50 mg tablet] 60 tablet 1    Sig: TAKE ONE TABLET BY MOUTH TWICE DAILY AS NEEDED. Fill ON OR AFTER 05/02/2920. Each prescription TO last ONE mth]      Not Delegated - Analgesics:  Opioid Agonists Failed - 06/01/2020  2:59 PM      Failed - This refill cannot be delegated      Failed - Urine Drug Screen completed in last 360 days      Failed - Valid encounter within last 6 months     Recent Outpatient Visits           6 months ago Postnasal drip   [place] [facility] Marcine Matar, MD   9 months ago Seasonal allergies   [place] [facility] Marcine Matar, MD   11 months ago Constipation, unspecified constipation type   [facility] Marcine Matar, MD   1 year ago Prediabetes   [facility] Marcine Matar, MD   1 year ago Prediabetes   [facility] Marcine Matar, MD       Future Appointments             In 6 days Grayce Sessions, NP [facility]

## 2020-06-07 ENCOUNTER — Ambulatory Visit (INDEPENDENT_AMBULATORY_CARE_PROVIDER_SITE_OTHER): Payer: Medicare Other | Admitting: Primary Care

## 2020-07-04 ENCOUNTER — Other Ambulatory Visit: Payer: Self-pay | Admitting: Internal Medicine

## 2020-07-04 DIAGNOSIS — G8929 Other chronic pain: Secondary | ICD-10-CM

## 2020-07-04 NOTE — Telephone Encounter (Signed)
Requested medication (s) are due for refill today: yes  Requested medication (s) are on the active medication list: yes  Last refill:  04/04/20 #60 1 refill  Future visit scheduled: no  Notes to clinic:  not delegated per protocol, no valid encounter within 6 months. Attempted to contact patient to set up appt. No answer, unable to leave voicemail due to mailbox has not been set up.    Requested Prescriptions  Pending Prescriptions Disp Refills   traMADol (ULTRAM) 50 MG tablet [Pharmacy Med Name: tramadol 50 mg tablet] 60 tablet 1    Sig: TAKE ONE TABLET BY MOUTH TWICE DAILY AS NEEDED. Fill ON OR AFTER 05/02/2020. Each prescription TO last ONE mth]      Not Delegated - Analgesics:  Opioid Agonists Failed - 07/04/2020  2:32 PM      Failed - This refill cannot be delegated      Failed - Urine Drug Screen completed in last 360 days      Failed - Valid encounter within last 6 months    Recent Outpatient Visits           7 months ago Postnasal drip   [facility] [facility] Marcine Matar, MD   11 months ago Seasonal allergies    [facility] Marcine Matar, MD   1 year ago Constipation, unspecified constipation type   [facility] Marcine Matar, MD   1 year ago Prediabetes   [facility] Marcine Matar, MD   1 year ago Prediabetes   [facility] Marcine Matar, MD

## 2020-08-15 DIAGNOSIS — I2510 Atherosclerotic heart disease of native coronary artery without angina pectoris: Secondary | ICD-10-CM | POA: Diagnosis not present

## 2020-08-15 DIAGNOSIS — E119 Type 2 diabetes mellitus without complications: Secondary | ICD-10-CM | POA: Diagnosis not present

## 2020-08-15 DIAGNOSIS — N390 Urinary tract infection, site not specified: Secondary | ICD-10-CM | POA: Diagnosis not present

## 2020-08-15 DIAGNOSIS — R072 Precordial pain: Secondary | ICD-10-CM | POA: Diagnosis not present

## 2020-08-23 DIAGNOSIS — Z0000 Encounter for general adult medical examination without abnormal findings: Secondary | ICD-10-CM | POA: Diagnosis not present

## 2020-08-23 DIAGNOSIS — Z1389 Encounter for screening for other disorder: Secondary | ICD-10-CM | POA: Diagnosis not present

## 2020-08-23 DIAGNOSIS — Z951 Presence of aortocoronary bypass graft: Secondary | ICD-10-CM | POA: Diagnosis not present

## 2020-08-23 DIAGNOSIS — I2510 Atherosclerotic heart disease of native coronary artery without angina pectoris: Secondary | ICD-10-CM | POA: Diagnosis not present

## 2020-08-23 DIAGNOSIS — E785 Hyperlipidemia, unspecified: Secondary | ICD-10-CM | POA: Diagnosis not present

## 2020-08-23 DIAGNOSIS — J309 Allergic rhinitis, unspecified: Secondary | ICD-10-CM | POA: Diagnosis not present

## 2020-08-23 DIAGNOSIS — K649 Unspecified hemorrhoids: Secondary | ICD-10-CM | POA: Diagnosis not present

## 2020-09-06 DIAGNOSIS — E119 Type 2 diabetes mellitus without complications: Secondary | ICD-10-CM | POA: Diagnosis not present

## 2020-09-06 DIAGNOSIS — H04123 Dry eye syndrome of bilateral lacrimal glands: Secondary | ICD-10-CM | POA: Diagnosis not present

## 2020-09-06 DIAGNOSIS — H26493 Other secondary cataract, bilateral: Secondary | ICD-10-CM | POA: Diagnosis not present

## 2020-10-04 ENCOUNTER — Other Ambulatory Visit: Payer: Self-pay | Admitting: Cardiovascular Disease

## 2020-10-04 ENCOUNTER — Ambulatory Visit
Admission: RE | Admit: 2020-10-04 | Discharge: 2020-10-04 | Disposition: A | Discharge status: Home / Self Care | Payer: Medicare Other | Source: Ambulatory Visit | Attending: Cardiovascular Disease | Admitting: Cardiovascular Disease

## 2020-10-04 DIAGNOSIS — M545 Low back pain, unspecified: Secondary | ICD-10-CM

## 2020-10-04 DIAGNOSIS — E876 Hypokalemia: Secondary | ICD-10-CM | POA: Diagnosis not present

## 2020-10-04 DIAGNOSIS — Z79899 Other long term (current) drug therapy: Secondary | ICD-10-CM | POA: Diagnosis not present

## 2020-10-04 DIAGNOSIS — M25562 Pain in left knee: Secondary | ICD-10-CM

## 2020-10-04 DIAGNOSIS — E7849 Other hyperlipidemia: Secondary | ICD-10-CM | POA: Diagnosis not present

## 2020-10-04 DIAGNOSIS — D649 Anemia, unspecified: Secondary | ICD-10-CM | POA: Diagnosis not present

## 2020-10-04 DIAGNOSIS — I708 Atherosclerosis of other arteries: Secondary | ICD-10-CM | POA: Diagnosis not present

## 2020-10-04 DIAGNOSIS — I70202 Unspecified atherosclerosis of native arteries of extremities, left leg: Secondary | ICD-10-CM | POA: Diagnosis not present

## 2020-10-04 DIAGNOSIS — E1165 Type 2 diabetes mellitus with hyperglycemia: Secondary | ICD-10-CM | POA: Diagnosis not present

## 2021-02-21 DIAGNOSIS — J45909 Unspecified asthma, uncomplicated: Secondary | ICD-10-CM | POA: Diagnosis not present

## 2021-02-21 DIAGNOSIS — J309 Allergic rhinitis, unspecified: Secondary | ICD-10-CM | POA: Diagnosis not present

## 2021-02-21 DIAGNOSIS — R7303 Prediabetes: Secondary | ICD-10-CM | POA: Diagnosis not present

## 2021-02-21 DIAGNOSIS — R21 Rash and other nonspecific skin eruption: Secondary | ICD-10-CM | POA: Diagnosis not present

## 2021-02-28 DIAGNOSIS — R072 Precordial pain: Secondary | ICD-10-CM | POA: Diagnosis not present

## 2021-02-28 DIAGNOSIS — I2510 Atherosclerotic heart disease of native coronary artery without angina pectoris: Secondary | ICD-10-CM | POA: Diagnosis not present

## 2021-02-28 DIAGNOSIS — K219 Gastro-esophageal reflux disease without esophagitis: Secondary | ICD-10-CM | POA: Diagnosis not present

## 2021-02-28 DIAGNOSIS — E119 Type 2 diabetes mellitus without complications: Secondary | ICD-10-CM | POA: Diagnosis not present

## 2021-02-28 DIAGNOSIS — I340 Nonrheumatic mitral (valve) insufficiency: Secondary | ICD-10-CM | POA: Diagnosis not present

## 2021-04-03 ENCOUNTER — Ambulatory Visit
Admission: RE | Admit: 2021-04-03 | Discharge: 2021-04-03 | Disposition: A | Discharge status: Home / Self Care | Payer: Medicare Other | Source: Ambulatory Visit | Attending: Cardiovascular Disease | Admitting: Cardiovascular Disease

## 2021-04-03 ENCOUNTER — Other Ambulatory Visit: Payer: Self-pay | Admitting: Cardiovascular Disease

## 2021-04-03 DIAGNOSIS — M25551 Pain in right hip: Secondary | ICD-10-CM

## 2021-04-03 DIAGNOSIS — R072 Precordial pain: Secondary | ICD-10-CM | POA: Diagnosis not present

## 2021-04-03 DIAGNOSIS — I2510 Atherosclerotic heart disease of native coronary artery without angina pectoris: Secondary | ICD-10-CM | POA: Diagnosis not present

## 2021-04-03 DIAGNOSIS — E119 Type 2 diabetes mellitus without complications: Secondary | ICD-10-CM | POA: Diagnosis not present

## 2021-04-03 DIAGNOSIS — I340 Nonrheumatic mitral (valve) insufficiency: Secondary | ICD-10-CM | POA: Diagnosis not present

## 2021-04-04 DIAGNOSIS — E7849 Other hyperlipidemia: Secondary | ICD-10-CM | POA: Diagnosis not present

## 2021-04-04 DIAGNOSIS — E876 Hypokalemia: Secondary | ICD-10-CM | POA: Diagnosis not present

## 2021-04-04 DIAGNOSIS — Z79899 Other long term (current) drug therapy: Secondary | ICD-10-CM | POA: Diagnosis not present

## 2021-04-04 DIAGNOSIS — D649 Anemia, unspecified: Secondary | ICD-10-CM | POA: Diagnosis not present

## 2021-04-04 DIAGNOSIS — E1165 Type 2 diabetes mellitus with hyperglycemia: Secondary | ICD-10-CM | POA: Diagnosis not present

## 2021-05-30 DIAGNOSIS — J45909 Unspecified asthma, uncomplicated: Secondary | ICD-10-CM | POA: Diagnosis not present

## 2021-05-30 DIAGNOSIS — J309 Allergic rhinitis, unspecified: Secondary | ICD-10-CM | POA: Diagnosis not present

## 2021-05-30 DIAGNOSIS — N390 Urinary tract infection, site not specified: Secondary | ICD-10-CM | POA: Diagnosis not present

## 2021-05-30 DIAGNOSIS — Z7984 Long term (current) use of oral hypoglycemic drugs: Secondary | ICD-10-CM | POA: Diagnosis not present

## 2021-05-30 DIAGNOSIS — E1165 Type 2 diabetes mellitus with hyperglycemia: Secondary | ICD-10-CM | POA: Diagnosis not present

## 2021-06-02 DIAGNOSIS — I2510 Atherosclerotic heart disease of native coronary artery without angina pectoris: Secondary | ICD-10-CM | POA: Diagnosis not present

## 2021-06-02 DIAGNOSIS — I340 Nonrheumatic mitral (valve) insufficiency: Secondary | ICD-10-CM | POA: Diagnosis not present

## 2021-06-02 DIAGNOSIS — R072 Precordial pain: Secondary | ICD-10-CM | POA: Diagnosis not present

## 2021-06-02 DIAGNOSIS — E119 Type 2 diabetes mellitus without complications: Secondary | ICD-10-CM | POA: Diagnosis not present

## 2021-06-16 DIAGNOSIS — N390 Urinary tract infection, site not specified: Secondary | ICD-10-CM | POA: Diagnosis not present

## 2021-07-04 DIAGNOSIS — H524 Presbyopia: Secondary | ICD-10-CM | POA: Diagnosis not present

## 2021-07-04 DIAGNOSIS — R7303 Prediabetes: Secondary | ICD-10-CM | POA: Diagnosis not present

## 2021-07-04 DIAGNOSIS — H43813 Vitreous degeneration, bilateral: Secondary | ICD-10-CM | POA: Diagnosis not present

## 2021-07-04 DIAGNOSIS — H04123 Dry eye syndrome of bilateral lacrimal glands: Secondary | ICD-10-CM | POA: Diagnosis not present

## 2021-07-11 DIAGNOSIS — N390 Urinary tract infection, site not specified: Secondary | ICD-10-CM | POA: Diagnosis not present

## 2021-07-11 DIAGNOSIS — R350 Frequency of micturition: Secondary | ICD-10-CM | POA: Diagnosis not present

## 2021-08-29 DIAGNOSIS — J45909 Unspecified asthma, uncomplicated: Secondary | ICD-10-CM | POA: Diagnosis not present

## 2021-08-29 DIAGNOSIS — E1169 Type 2 diabetes mellitus with other specified complication: Secondary | ICD-10-CM | POA: Diagnosis not present

## 2021-08-29 DIAGNOSIS — Z7984 Long term (current) use of oral hypoglycemic drugs: Secondary | ICD-10-CM | POA: Diagnosis not present

## 2021-08-29 DIAGNOSIS — I10 Essential (primary) hypertension: Secondary | ICD-10-CM | POA: Diagnosis not present

## 2021-08-29 DIAGNOSIS — Z951 Presence of aortocoronary bypass graft: Secondary | ICD-10-CM | POA: Diagnosis not present

## 2021-08-29 DIAGNOSIS — I25118 Atherosclerotic heart disease of native coronary artery with other forms of angina pectoris: Secondary | ICD-10-CM | POA: Diagnosis not present

## 2021-08-29 DIAGNOSIS — E785 Hyperlipidemia, unspecified: Secondary | ICD-10-CM | POA: Diagnosis not present

## 2021-08-29 DIAGNOSIS — Z0000 Encounter for general adult medical examination without abnormal findings: Secondary | ICD-10-CM | POA: Diagnosis not present

## 2021-08-29 DIAGNOSIS — Z1389 Encounter for screening for other disorder: Secondary | ICD-10-CM | POA: Diagnosis not present

## 2021-08-29 DIAGNOSIS — Z23 Encounter for immunization: Secondary | ICD-10-CM | POA: Diagnosis not present

## 2021-08-29 DIAGNOSIS — R300 Dysuria: Secondary | ICD-10-CM | POA: Diagnosis not present

## 2021-09-14 DIAGNOSIS — R350 Frequency of micturition: Secondary | ICD-10-CM | POA: Diagnosis not present

## 2021-09-14 DIAGNOSIS — N390 Urinary tract infection, site not specified: Secondary | ICD-10-CM | POA: Diagnosis not present

## 2021-09-26 ENCOUNTER — Other Ambulatory Visit: Payer: Self-pay | Admitting: Cardiovascular Disease

## 2021-09-26 ENCOUNTER — Ambulatory Visit
Admission: RE | Admit: 2021-09-26 | Discharge: 2021-09-26 | Disposition: A | Discharge status: Home / Self Care | Payer: Medicare Other | Source: Ambulatory Visit | Attending: Cardiovascular Disease | Admitting: Cardiovascular Disease

## 2021-09-26 DIAGNOSIS — J40 Bronchitis, not specified as acute or chronic: Secondary | ICD-10-CM

## 2021-09-26 DIAGNOSIS — E119 Type 2 diabetes mellitus without complications: Secondary | ICD-10-CM | POA: Diagnosis not present

## 2021-09-26 DIAGNOSIS — R079 Chest pain, unspecified: Secondary | ICD-10-CM | POA: Diagnosis not present

## 2021-09-26 DIAGNOSIS — I340 Nonrheumatic mitral (valve) insufficiency: Secondary | ICD-10-CM | POA: Diagnosis not present

## 2021-09-26 DIAGNOSIS — R059 Cough, unspecified: Secondary | ICD-10-CM | POA: Diagnosis not present

## 2021-09-26 DIAGNOSIS — I2510 Atherosclerotic heart disease of native coronary artery without angina pectoris: Secondary | ICD-10-CM | POA: Diagnosis not present

## 2021-09-26 DIAGNOSIS — R072 Precordial pain: Secondary | ICD-10-CM | POA: Diagnosis not present

## 2021-11-21 DIAGNOSIS — R072 Precordial pain: Secondary | ICD-10-CM | POA: Diagnosis not present

## 2021-11-21 DIAGNOSIS — E119 Type 2 diabetes mellitus without complications: Secondary | ICD-10-CM | POA: Diagnosis not present

## 2021-11-21 DIAGNOSIS — I340 Nonrheumatic mitral (valve) insufficiency: Secondary | ICD-10-CM | POA: Diagnosis not present

## 2021-11-21 DIAGNOSIS — I2510 Atherosclerotic heart disease of native coronary artery without angina pectoris: Secondary | ICD-10-CM | POA: Diagnosis not present

## 2021-12-26 ENCOUNTER — Ambulatory Visit (INDEPENDENT_AMBULATORY_CARE_PROVIDER_SITE_OTHER): Payer: Medicare Other | Admitting: Obstetrics & Gynecology

## 2021-12-26 ENCOUNTER — Encounter (HOSPITAL_BASED_OUTPATIENT_CLINIC_OR_DEPARTMENT_OTHER): Payer: Self-pay | Admitting: Obstetrics & Gynecology

## 2021-12-26 VITALS — BP 143/97 | HR 75 | Ht 64.0 in | Wt 164.8 lb

## 2021-12-26 DIAGNOSIS — N390 Urinary tract infection, site not specified: Secondary | ICD-10-CM

## 2021-12-27 DIAGNOSIS — R072 Precordial pain: Secondary | ICD-10-CM | POA: Diagnosis not present

## 2021-12-27 DIAGNOSIS — E119 Type 2 diabetes mellitus without complications: Secondary | ICD-10-CM | POA: Diagnosis not present

## 2021-12-27 DIAGNOSIS — I340 Nonrheumatic mitral (valve) insufficiency: Secondary | ICD-10-CM | POA: Diagnosis not present

## 2021-12-27 DIAGNOSIS — I2510 Atherosclerotic heart disease of native coronary artery without angina pectoris: Secondary | ICD-10-CM | POA: Diagnosis not present

## 2022-02-14 ENCOUNTER — Encounter (HOSPITAL_COMMUNITY): Payer: Self-pay | Admitting: Cardiovascular Disease

## 2022-02-14 ENCOUNTER — Inpatient Hospital Stay (HOSPITAL_COMMUNITY): Payer: Medicare Other

## 2022-02-14 ENCOUNTER — Other Ambulatory Visit: Payer: Self-pay

## 2022-02-14 ENCOUNTER — Encounter (HOSPITAL_COMMUNITY): Payer: Self-pay

## 2022-02-14 ENCOUNTER — Inpatient Hospital Stay (HOSPITAL_COMMUNITY)
Admission: RE | Admit: 2022-02-14 | Discharge: 2022-02-17 | DRG: 287 | Disposition: A | Discharge status: Home / Self Care | Payer: Medicare Other | Source: Ambulatory Visit | Attending: Cardiovascular Disease | Admitting: Cardiovascular Disease

## 2022-02-14 DIAGNOSIS — Z79899 Other long term (current) drug therapy: Secondary | ICD-10-CM

## 2022-02-14 DIAGNOSIS — Z885 Allergy status to narcotic agent status: Secondary | ICD-10-CM | POA: Diagnosis not present

## 2022-02-14 DIAGNOSIS — E7800 Pure hypercholesterolemia, unspecified: Secondary | ICD-10-CM | POA: Diagnosis present

## 2022-02-14 DIAGNOSIS — K219 Gastro-esophageal reflux disease without esophagitis: Secondary | ICD-10-CM | POA: Diagnosis not present

## 2022-02-14 DIAGNOSIS — N182 Chronic kidney disease, stage 2 (mild): Secondary | ICD-10-CM | POA: Diagnosis not present

## 2022-02-14 DIAGNOSIS — Z8249 Family history of ischemic heart disease and other diseases of the circulatory system: Secondary | ICD-10-CM

## 2022-02-14 DIAGNOSIS — I25110 Atherosclerotic heart disease of native coronary artery with unstable angina pectoris: Secondary | ICD-10-CM | POA: Diagnosis not present

## 2022-02-14 DIAGNOSIS — E1122 Type 2 diabetes mellitus with diabetic chronic kidney disease: Secondary | ICD-10-CM | POA: Diagnosis not present

## 2022-02-14 DIAGNOSIS — Z7982 Long term (current) use of aspirin: Secondary | ICD-10-CM | POA: Diagnosis not present

## 2022-02-14 DIAGNOSIS — J45909 Unspecified asthma, uncomplicated: Secondary | ICD-10-CM | POA: Diagnosis not present

## 2022-02-14 DIAGNOSIS — Z951 Presence of aortocoronary bypass graft: Secondary | ICD-10-CM | POA: Diagnosis not present

## 2022-02-14 DIAGNOSIS — I129 Hypertensive chronic kidney disease with stage 1 through stage 4 chronic kidney disease, or unspecified chronic kidney disease: Secondary | ICD-10-CM | POA: Diagnosis present

## 2022-02-14 DIAGNOSIS — R079 Chest pain, unspecified: Secondary | ICD-10-CM | POA: Diagnosis not present

## 2022-02-14 DIAGNOSIS — I252 Old myocardial infarction: Secondary | ICD-10-CM | POA: Diagnosis not present

## 2022-02-14 DIAGNOSIS — I249 Acute ischemic heart disease, unspecified: Principal | ICD-10-CM | POA: Diagnosis present

## 2022-02-14 DIAGNOSIS — Z888 Allergy status to other drugs, medicaments and biological substances status: Secondary | ICD-10-CM | POA: Diagnosis not present

## 2022-02-14 DIAGNOSIS — Z7902 Long term (current) use of antithrombotics/antiplatelets: Secondary | ICD-10-CM | POA: Diagnosis not present

## 2022-02-14 DIAGNOSIS — E119 Type 2 diabetes mellitus without complications: Secondary | ICD-10-CM | POA: Diagnosis not present

## 2022-02-14 DIAGNOSIS — I340 Nonrheumatic mitral (valve) insufficiency: Secondary | ICD-10-CM | POA: Diagnosis not present

## 2022-02-14 DIAGNOSIS — I25810 Atherosclerosis of coronary artery bypass graft(s) without angina pectoris: Secondary | ICD-10-CM | POA: Diagnosis not present

## 2022-02-14 LAB — COMPREHENSIVE METABOLIC PANEL
ALT: 11 U/L (ref 0–44)
AST: 16 U/L (ref 15–41)
Albumin: 3.7 g/dL (ref 3.5–5.0)
Alkaline Phosphatase: 48 U/L (ref 38–126)
Anion gap: 7 (ref 5–15)
BUN: 11 mg/dL (ref 8–23)
CO2: 27 mmol/L (ref 22–32)
Calcium: 9.4 mg/dL (ref 8.9–10.3)
Chloride: 106 mmol/L (ref 98–111)
Creatinine, Ser: 1.04 mg/dL — ABNORMAL HIGH (ref 0.44–1.00)
GFR, Estimated: 56 mL/min — ABNORMAL LOW (ref >60)
Glucose, Bld: 126 mg/dL — ABNORMAL HIGH (ref 70–99)
Potassium: 4.3 mmol/L (ref 3.5–5.1)
Sodium: 140 mmol/L (ref 135–145)
Total Bilirubin: 0.4 mg/dL (ref 0.3–1.2)
Total Protein: 7.3 g/dL (ref 6.5–8.1)

## 2022-02-14 LAB — CBC WITH DIFFERENTIAL/PLATELET
Abs Immature Granulocytes: 0.02 10*3/uL (ref 0.00–0.07)
Basophils Absolute: 0.1 10*3/uL (ref 0.0–0.1)
Basophils Relative: 1 %
Eosinophils Absolute: 0.5 10*3/uL (ref 0.0–0.5)
Eosinophils Relative: 7 %
HCT: 35.1 % — ABNORMAL LOW (ref 36.0–46.0)
Hemoglobin: 11.2 g/dL — ABNORMAL LOW (ref 12.0–15.0)
Immature Granulocytes: 0 %
Lymphocytes Relative: 35 %
Lymphs Abs: 2.4 10*3/uL (ref 0.7–4.0)
MCH: 28.6 pg (ref 26.0–34.0)
MCHC: 31.9 g/dL (ref 30.0–36.0)
MCV: 89.8 fL (ref 80.0–100.0)
Monocytes Absolute: 0.7 10*3/uL (ref 0.1–1.0)
Monocytes Relative: 10 %
Neutro Abs: 3.2 10*3/uL (ref 1.7–7.7)
Neutrophils Relative %: 47 %
Platelets: 233 10*3/uL (ref 150–400)
RBC: 3.91 MIL/uL (ref 3.87–5.11)
RDW: 13.9 % (ref 11.5–15.5)
WBC: 6.8 10*3/uL (ref 4.0–10.5)
nRBC: 0 % (ref 0.0–0.2)

## 2022-02-14 LAB — PROTIME-INR
INR: 1.1 (ref 0.8–1.2)
Prothrombin Time: 14 seconds (ref 11.4–15.2)

## 2022-02-14 LAB — APTT: aPTT: 34 seconds (ref 24–36)

## 2022-02-14 LAB — TROPONIN I (HIGH SENSITIVITY): Troponin I (High Sensitivity): 7 ng/L (ref <18)

## 2022-02-14 MED ORDER — DILTIAZEM HCL 60 MG PO TABS
60.0000 mg | ORAL_TABLET | Freq: Three times a day (TID) | ORAL | Status: DC
  Administered 2022-02-15 – 2022-02-17 (×8): 60 mg via ORAL
  Filled 2022-02-14 (×8): qty 1

## 2022-02-14 MED ORDER — NITROGLYCERIN 0.4 MG SL SUBL: 0.4000 mg | SUBLINGUAL_TABLET | SUBLINGUAL | Status: DC | PRN

## 2022-02-14 MED ORDER — METOPROLOL SUCCINATE ER 50 MG PO TB24
50.0000 mg | ORAL_TABLET | Freq: Every day | ORAL | Status: DC
  Administered 2022-02-15 – 2022-02-16 (×3): 50 mg via ORAL
  Filled 2022-02-14 (×3): qty 1

## 2022-02-14 MED ORDER — CALCIUM CARBONATE 1250 (500 CA) MG PO TABS
1250.0000 mg | ORAL_TABLET | Freq: Every day | ORAL | Status: DC
  Administered 2022-02-15 – 2022-02-17 (×3): 1250 mg via ORAL
  Filled 2022-02-14 (×3): qty 1

## 2022-02-14 MED ORDER — ACETAMINOPHEN 325 MG PO TABS
650.0000 mg | ORAL_TABLET | ORAL | Status: DC | PRN
  Administered 2022-02-15 – 2022-02-17 (×5): 650 mg via ORAL
  Filled 2022-02-14 (×5): qty 2

## 2022-02-14 MED ORDER — METFORMIN HCL 500 MG PO TABS
500.0000 mg | ORAL_TABLET | Freq: Two times a day (BID) | ORAL | Status: DC
Start: 2022-02-14 — End: 2022-02-15
  Administered 2022-02-15 (×2): 500 mg via ORAL
  Filled 2022-02-14 (×2): qty 1

## 2022-02-14 MED ORDER — ROSUVASTATIN CALCIUM 20 MG PO TABS
20.0000 mg | ORAL_TABLET | Freq: Every day | ORAL | Status: DC
  Administered 2022-02-15 – 2022-02-17 (×3): 20 mg via ORAL
  Filled 2022-02-14 (×3): qty 1

## 2022-02-14 MED ORDER — HEPARIN BOLUS VIA INFUSION
4000.0000 [IU] | Freq: Once | INTRAVENOUS | Status: AC
  Administered 2022-02-14: 4000 [IU] via INTRAVENOUS
  Filled 2022-02-14: qty 4000

## 2022-02-14 MED ORDER — CLOPIDOGREL BISULFATE 75 MG PO TABS
75.0000 mg | ORAL_TABLET | Freq: Every day | ORAL | Status: DC
  Administered 2022-02-15 – 2022-02-17 (×3): 75 mg via ORAL
  Filled 2022-02-14 (×3): qty 1

## 2022-02-14 MED ORDER — HEPARIN (PORCINE) 25000 UT/250ML-% IV SOLN
850.0000 [IU]/h | INTRAVENOUS | Status: DC
  Administered 2022-02-14 – 2022-02-15 (×2): 850 [IU]/h via INTRAVENOUS
  Filled 2022-02-14 (×2): qty 250

## 2022-02-14 MED ORDER — PANTOPRAZOLE SODIUM 20 MG PO TBEC
20.0000 mg | DELAYED_RELEASE_TABLET | Freq: Every day | ORAL | Status: DC
  Administered 2022-02-15 – 2022-02-17 (×3): 20 mg via ORAL
  Filled 2022-02-14 (×3): qty 1

## 2022-02-14 MED ORDER — SODIUM CHLORIDE 0.9 % IV SOLN: INTRAVENOUS | Status: DC

## 2022-02-14 MED ORDER — TRAMADOL HCL 50 MG PO TABS
50.0000 mg | ORAL_TABLET | Freq: Four times a day (QID) | ORAL | Status: DC | PRN
  Administered 2022-02-15 – 2022-02-16 (×2): 50 mg via ORAL
  Filled 2022-02-14 (×2): qty 1

## 2022-02-14 MED ORDER — METOPROLOL SUCCINATE ER 100 MG PO TB24
100.0000 mg | ORAL_TABLET | Freq: Every day | ORAL | Status: DC
  Administered 2022-02-15 – 2022-02-16 (×2): 100 mg via ORAL
  Filled 2022-02-14 (×3): qty 1

## 2022-02-14 MED ORDER — FLUTICASONE PROPIONATE 50 MCG/ACT NA SUSP
1.0000 | Freq: Every day | NASAL | Status: DC
  Administered 2022-02-15 – 2022-02-17 (×2): 1 via NASAL
  Filled 2022-02-14: qty 16

## 2022-02-14 MED ORDER — ALBUTEROL SULFATE (2.5 MG/3ML) 0.083% IN NEBU: 3.0000 mL | INHALATION_SOLUTION | Freq: Four times a day (QID) | RESPIRATORY_TRACT | Status: DC | PRN

## 2022-02-14 MED ORDER — ASPIRIN 81 MG PO TBEC
81.0000 mg | DELAYED_RELEASE_TABLET | Freq: Every day | ORAL | Status: DC
  Administered 2022-02-15 – 2022-02-17 (×3): 81 mg via ORAL
  Filled 2022-02-14 (×3): qty 1

## 2022-02-14 MED ORDER — CLOTRIMAZOLE-BETAMETHASONE 1-0.05 % EX CREA
1.0000 | TOPICAL_CREAM | Freq: Two times a day (BID) | CUTANEOUS | Status: DC
Start: 2022-02-14 — End: 2022-02-17
  Administered 2022-02-15 – 2022-02-17 (×4): 1 via TOPICAL
  Filled 2022-02-14: qty 15

## 2022-02-14 MED ORDER — MONTELUKAST SODIUM 10 MG PO TABS
10.0000 mg | ORAL_TABLET | Freq: Every day | ORAL | Status: DC
  Administered 2022-02-15 – 2022-02-17 (×3): 10 mg via ORAL
  Filled 2022-02-14 (×3): qty 1

## 2022-02-14 MED ORDER — ONDANSETRON HCL 4 MG/2ML IJ SOLN: 4.0000 mg | Freq: Four times a day (QID) | INTRAMUSCULAR | Status: DC | PRN

## 2022-02-14 MED ORDER — ISOSORBIDE MONONITRATE ER 60 MG PO TB24
120.0000 mg | ORAL_TABLET | Freq: Every day | ORAL | Status: DC
  Administered 2022-02-15 – 2022-02-17 (×3): 120 mg via ORAL
  Filled 2022-02-14 (×3): qty 2

## 2022-02-14 MED ORDER — ENALAPRIL MALEATE 10 MG PO TABS
20.0000 mg | ORAL_TABLET | Freq: Every day | ORAL | Status: DC
  Administered 2022-02-15 – 2022-02-16 (×2): 20 mg via ORAL
  Filled 2022-02-14: qty 8
  Filled 2022-02-14: qty 2
  Filled 2022-02-14: qty 8
  Filled 2022-02-14: qty 2

## 2022-02-14 MED ORDER — VITAMIN D 25 MCG (1000 UNIT) PO TABS
1000.0000 [IU] | ORAL_TABLET | Freq: Every day | ORAL | Status: DC
  Administered 2022-02-15 – 2022-02-17 (×3): 1000 [IU] via ORAL
  Filled 2022-02-14 (×3): qty 1

## 2022-02-14 MED ORDER — TIZANIDINE HCL 2 MG PO TABS
2.0000 mg | ORAL_TABLET | Freq: Every day | ORAL | Status: DC
  Administered 2022-02-15 – 2022-02-16 (×3): 2 mg via ORAL
  Filled 2022-02-14 (×4): qty 1

## 2022-02-14 NOTE — Progress Notes (Signed)
ANTICOAGULATION CONSULT NOTE - Initial Consult  Pharmacy Consult for heparin infusion Indication: chest pain/ACS  Allergies  Allergen Reactions   Statins Other (See Comments)    Muscle aches    Codeine Other (See Comments)    Bleeding in urine    Patient Measurements: Height: 5\' 4"  (162.6 cm) Weight: 74 kg (163 lb 2.3 oz) IBW/kg (Calculated) : 54.7 Heparin Dosing Weight: 70 kg  Vital Signs: Temp: 98.3 F (36.8 C) (08/16 2048) Temp Source: Oral (08/16 2048) BP: 142/92 (08/16 2048) Pulse Rate: 73 (08/16 2048)  Labs: No results for input(s): "HGB", "HCT", "PLT", "APTT", "LABPROT", "INR", "HEPARINUNFRC", "HEPRLOWMOCWT", "CREATININE", "CKTOTAL", "CKMB", "TROPONINIHS" in the last 72 hours.  CrCl cannot be calculated (Patient's most recent lab result is older than the maximum 21 days allowed.).   Medical History: Past Medical History:  Diagnosis Date   Asthma    "pollen, grass" (04/20/2016)   Chronic lower back pain    Coronary artery disease    High cholesterol    Hypertension Dx 1990   Myocardial infarction [facility]) 2002   Pneumonia 04/2016   Sciatica 04/19/2016   Sciatica     Medications:  Medications Prior to Admission  Medication Sig Dispense Refill Last Dose   albuterol (VENTOLIN HFA) 108 (90 Base) MCG/ACT inhaler INHALE 1 TO 2 PUFFS EVERY 6 HOURS AS NEEDED FOR WHEEZING OR SHORTNESS OF BREATH 8 g 1    aspirin 81 MG tablet Take 1 tablet (81 mg total) by mouth daily. 90 tablet 3    calcium carbonate (OS-CAL) 1250 (500 Ca) MG chewable tablet Chew 1 tablet by mouth daily.      cephALEXin (KEFLEX) 500 MG capsule Take 1 capsule (500 mg total) by mouth 3 (three) times daily. 21 capsule 0    cholecalciferol (VITAMIN D) 1000 units tablet Take 1,000 Units by mouth daily.      clopidogrel (PLAVIX) 75 MG tablet Take 1 tablet (75 mg total) by mouth daily. 30 tablet 3    clotrimazole-betamethasone (LOTRISONE) cream APPLY TO AFFECTED AREA TWICE DAILY 45 g 1    diltiazem  (CARDIZEM) 60 MG tablet Take 60 mg by mouth 3 (three) times daily.      enalapril (VASOTEC) 20 MG tablet Take 1 tablet (20 mg total) by mouth daily. 90 tablet 0    fluticasone (FLONASE) 50 MCG/ACT nasal spray Place 1 spray into both nostrils daily. 11.1 mL 2    isosorbide mononitrate (IMDUR) 60 MG 24 hr tablet Take 120 mg by mouth daily.   3    KRILL OIL PO Take 1 capsule by mouth daily.      metFORMIN (GLUCOPHAGE) 500 MG tablet Take 1 tablet (500 mg total) by mouth 2 (two) times daily. 180 tablet 3    metoprolol succinate (TOPROL-XL) 100 MG 24 hr tablet Take 1 tablet (100 mg total) by mouth daily. Take with or immediately following a meal. 90 tablet 0    montelukast (SINGULAIR) 10 MG tablet TAKE 1 TABLET EVERY DAY 90 tablet 2    NITROSTAT 0.4 MG SL tablet Place 1 tablet (0.4 mg total) under the tongue every 5 (five) minutes as needed for chest pain. Reported on 10/20/2015 25 tablet 2    pantoprazole (PROTONIX) 20 MG tablet TAKE 1 TABLET EVERY DAY 90 tablet 2    QUNOL COQ10/UBIQUINOL/MEGA PO Take 5 mLs by mouth daily.      rosuvastatin (CRESTOR) 20 MG tablet Take 1 tablet (20 mg total) by mouth daily. 90 tablet 3  tiZANidine (ZANAFLEX) 2 MG tablet TAKE ONE TABLET BY MOUTH AT BEDTIME AS NEEDED 30 tablet 1    traMADol (ULTRAM) 50 MG tablet TAKE ONE TABLET BY MOUTH TWICE DAILY AS NEEDED. Fill ON OR AFTER 05/02/2920. Each prescription TO last ONE mth] 60 tablet 1     Assessment: 80 YOF admitted through the ED with a cc of recurrent chest pain. She does not take any form of anticoagulation in the outpatient setting based on a review of the EMR and pharmacy records. A consult was placed for a heparin infusion  Goal of Therapy:  Heparin level 0.3-0.7 units/ml Monitor platelets by anticoagulation protocol: Yes   Plan:  Give 4000 units bolus x 1 Start heparin infusion at 850 units/hr Check anti-Xa level in 8 hours and daily while on heparin Continue to monitor H&H and platelets  Cortnee Steinmiller BS,  PharmD, BCPS Clinical Pharmacist 02/14/2022 9:24 PM  Contact: [phone] after 3 PM  "Be curious, not judgmental..." -Debbora Dus

## 2022-02-14 NOTE — H&P (Signed)
Referring Physician:  [name] is an 76 y.o. female.                       Chief Complaint: Recurrent chest pain.  HPI: 76 years old female with PMH of CAD, CABG, HTN and HLD has recurrent chest pain.  Past Medical History:  Diagnosis Date   Asthma    "pollen, grass" (04/20/2016)   Chronic lower back pain    Coronary artery disease    High cholesterol    Hypertension Dx 1990   Myocardial infarction [name]) 2002   Pneumonia 04/2016   Sciatica 04/19/2016   Sciatica       Past Surgical History:  Procedure Laterality Date   CARDIAC CATHETERIZATION  04/20/2016   CARDIAC CATHETERIZATION N/A 04/20/2016   Procedure: Left Heart Cath and Cors/Grafts Angiography;  Surgeon: Orpah Cobb, MD;  Location: [facility] INV[facility]IVE [facility];  Service: Cardiovascular;  Laterality: N/A;   CARDIAC CATHETERIZATION Right 04/09/2018   CORONARY ARTERY BYPASS GRAFT  2002   Quadruple Bypass in [place]   LEFT HEART CATH AND CORS/GRAFTS ANGIOGRAPHY N/A 04/09/2018   Procedure: LEFT HEART CATH AND CORS/GRAFTS ANGIOGRAPHY;  Surgeon: Orpah Cobb, MD;  Location: [facility] INVAS[facility] CV LAB;  Service: Cardiovascular;  Laterality: N/A;   SALPINGECTOMY  1980   laparotomy for ectopic    Family History  Problem Relation Age of Onset   Heart disease Mother    Heart disease Father    Heart disease Brother    Social History:  reports that she has never smoked. She has never used smokeless tobacco. She reports that she does not drink alcohol and does not use drugs.  Allergies:  Allergies  Allergen Reactions   Statins Other (See Comments)    Muscle aches    Codeine Other (See Comments)    Bleeding in urine    Medications Prior to Admission  Medication Sig Dispense Refill   albuterol (VENTOLIN HFA) 108 (90 Base) MCG/ACT inhaler INHALE 1 TO 2 PUFFS EVERY 6 HOURS AS NEEDED FOR WHEEZING OR SHORTNESS OF BREATH 8 g 1   aspirin 81 MG tablet Take 1 tablet (81 mg total) by mouth daily. 90 tablet 3   calcium carbonate (OS-CAL)  1250 (500 Ca) MG chewable tablet Chew 1 tablet by mouth daily.     cephALEXin (KEFLEX) 500 MG capsule Take 1 capsule (500 mg total) by mouth 3 (three) times daily. 21 capsule 0   cholecalciferol (VITAMIN D) 1000 units tablet Take 1,000 Units by mouth daily.     clopidogrel (PLAVIX) 75 MG tablet Take 1 tablet (75 mg total) by mouth daily. 30 tablet 3   clotrimazole-betamethasone (LOTRISONE) cream APPLY TO AFFECTED AREA TWICE DAILY 45 g 1   diltiazem (CARDIZEM) 60 MG tablet Take 60 mg by mouth 3 (three) times daily.     enalapril (VASOTEC) 20 MG tablet Take 1 tablet (20 mg total) by mouth daily. 90 tablet 0   fluticasone (FLONASE) 50 MCG/ACT nasal spray Place 1 spray into both nostrils daily. 11.1 mL 2   isosorbide mononitrate (IMDUR) 60 MG 24 hr tablet Take 120 mg by mouth daily.   3   KRILL OIL PO Take 1 capsule by mouth daily.     metFORMIN (GLUCOPHAGE) 500 MG tablet Take 1 tablet (500 mg total) by mouth 2 (two) times daily. 180 tablet 3   metoprolol succinate (TOPROL-XL) 100 MG 24 hr tablet Take 1 tablet (100 mg total) by mouth daily. Take with or  immediately following a meal. 90 tablet 0   montelukast (SINGULAIR) 10 MG tablet TAKE 1 TABLET EVERY DAY 90 tablet 2   NITROSTAT 0.4 MG SL tablet Place 1 tablet (0.4 mg total) under the tongue every 5 (five) minutes as needed for chest pain. Reported on 10/20/2015 25 tablet 2   pantoprazole (PROTONIX) 20 MG tablet TAKE 1 TABLET EVERY DAY 90 tablet 2   QUNOL COQ10/UBIQUINOL/MEGA PO Take 5 mLs by mouth daily.     rosuvastatin (CRESTOR) 20 MG tablet Take 1 tablet (20 mg total) by mouth daily. 90 tablet 3   tiZANidine (ZANAFLEX) 2 MG tablet TAKE ONE TABLET BY MOUTH AT BEDTIME AS NEEDED 30 tablet 1   traMADol (ULTRAM) 50 MG tablet TAKE ONE TABLET BY MOUTH TWICE DAILY AS NEEDED. Fill ON OR AFTER 05/02/2920. Each prescription TO last ONE mth] 60 tablet 1    No results found for this or any previous visit (from the past 48 hour(s)). No results  found.  Review Of Systems Constitutional: No fever, chills, weight loss or gain. Eyes: No vision change, wears glasses. No discharge or pain. Ears: No hearing loss, No tinnitus. Respiratory: Positive asthma, COPD, pneumonias. Positive shortness of breath. No hemoptysis. Cardiovascular: Positive chest pain, palpitation, leg edema. Gastrointestinal: No nausea, vomiting, diarrhea, constipation. No GI bleed. No hepatitis. Genitourinary: No dysuria, hematuria, kidney stone. No incontinance. Neurological: Positive headache, no stroke, seizures.  Psychiatry: No psych facility admission for anxiety, depression, suicide. No detox. Skin: No rash. Musculoskeletal: Positive joint pain, no fibromyalgia. No neck pain, back pain. Lymphadenopathy: No lymphadenopathy. Hematology: No anemia or easy bruising.   There were no vitals taken for this visit. There is no height or weight on file to calculate BMI. General appearance: alert, cooperative, appears stated age and mild distress Head: Normocephalic, atraumatic. Eyes: Brown eyes, pink conjunctiva, corneas clear.  Neck: No adenopathy, no carotid bruit, no JVD, supple, symmetrical, trachea midline and thyroid not enlarged. Resp: Clear to auscultation bilaterally. Cardio: Regular rate and rhythm, S1, S2 normal, II/VI systolic murmur, no click, rub or gallop GI: Soft, non-tender; bowel sounds normal; no organomegaly. Extremities: No edema, cyanosis or clubbing. Skin: Warm and dry.  Neurologic: Alert and oriented X 3, normal strength. Normal coordination and slow gait.  Assessment/Plan Acute coronary syndrome CAD CABG HTN HLD GERD  Plan: Admit. IV heparin Home medications Echocardiogram. Stress test v/s cardiac catheterization.  Time spent: Review of old records, Lab, x-rays, EKG, other cardiac tests, examination, discussion with patient over 70 minutes.  Ricki Rodriguez, MD  02/14/2022, 8:49 PM

## 2022-02-15 ENCOUNTER — Inpatient Hospital Stay (HOSPITAL_COMMUNITY): Payer: Medicare Other

## 2022-02-15 LAB — HEPARIN LEVEL (UNFRACTIONATED)
Heparin Unfractionated: 0.61 IU/mL (ref 0.30–0.70)
Heparin Unfractionated: 0.63 IU/mL (ref 0.30–0.70)

## 2022-02-15 LAB — LIPID PANEL
Cholesterol: 117 mg/dL (ref 0–200)
HDL: 39 mg/dL — ABNORMAL LOW (ref >40)
LDL Cholesterol: 64 mg/dL (ref 0–99)
Total CHOL/HDL Ratio: 3 RATIO
Triglycerides: 70 mg/dL (ref <150)
VLDL: 14 mg/dL (ref 0–40)

## 2022-02-15 LAB — CBC
HCT: 33.1 % — ABNORMAL LOW (ref 36.0–46.0)
Hemoglobin: 10.7 g/dL — ABNORMAL LOW (ref 12.0–15.0)
MCH: 28.6 pg (ref 26.0–34.0)
MCHC: 32.3 g/dL (ref 30.0–36.0)
MCV: 88.5 fL (ref 80.0–100.0)
Platelets: 214 10*3/uL (ref 150–400)
RBC: 3.74 MIL/uL — ABNORMAL LOW (ref 3.87–5.11)
RDW: 13.8 % (ref 11.5–15.5)
WBC: 7.2 10*3/uL (ref 4.0–10.5)
nRBC: 0 % (ref 0.0–0.2)

## 2022-02-15 LAB — TROPONIN I (HIGH SENSITIVITY): Troponin I (High Sensitivity): 7 ng/L (ref <18)

## 2022-02-15 LAB — ECHOCARDIOGRAM COMPLETE
Area-P 1/2: 4.06 cm2
Calc EF: 58.9 %
Height: 64 in
S' Lateral: 3.5 cm
Single Plane A2C EF: 57.6 %
Single Plane A4C EF: 62.9 %
Weight: 2610.25 oz

## 2022-02-15 LAB — BASIC METABOLIC PANEL
Anion gap: 7 (ref 5–15)
BUN: 12 mg/dL (ref 8–23)
CO2: 25 mmol/L (ref 22–32)
Calcium: 9.3 mg/dL (ref 8.9–10.3)
Chloride: 107 mmol/L (ref 98–111)
Creatinine, Ser: 1.08 mg/dL — ABNORMAL HIGH (ref 0.44–1.00)
GFR, Estimated: 53 mL/min — ABNORMAL LOW (ref >60)
Glucose, Bld: 106 mg/dL — ABNORMAL HIGH (ref 70–99)
Potassium: 4.3 mmol/L (ref 3.5–5.1)
Sodium: 139 mmol/L (ref 135–145)

## 2022-02-15 LAB — HEMOGLOBIN A1C
Hgb A1c MFr Bld: 6.2 % — ABNORMAL HIGH (ref 4.8–5.6)
Mean Plasma Glucose: 131.24 mg/dL

## 2022-02-15 LAB — GLUCOSE, CAPILLARY
Glucose-Capillary: 105 mg/dL — ABNORMAL HIGH (ref 70–99)
Glucose-Capillary: 105 mg/dL — ABNORMAL HIGH (ref 70–99)

## 2022-02-15 MED ORDER — INSULIN ASPART 100 UNIT/ML IJ SOLN
0.0000 [IU] | Freq: Three times a day (TID) | INTRAMUSCULAR | Status: DC
  Administered 2022-02-16 – 2022-02-17 (×2): 3 [IU] via SUBCUTANEOUS

## 2022-02-15 NOTE — Progress Notes (Signed)
ANTICOAGULATION CONSULT NOTE   Pharmacy Consult for heparin infusion Indication: chest pain/ACS  Allergies  Allergen Reactions   Statins Other (See Comments)    Muscle aches    Codeine Other (See Comments)    Bleeding in urine    Patient Measurements: Height: 5\' 4"  (162.6 cm) Weight: 74 kg (163 lb 2.3 oz) IBW/kg (Calculated) : 54.7 Heparin Dosing Weight: 70 kg  Vital Signs: Temp: 97.9 F (36.6 C) (08/17 0415) Temp Source: Oral (08/17 0415) BP: 97/58 (08/17 0415) Pulse Rate: 70 (08/17 0023)  Labs: Recent Labs    02/14/22 2127 02/15/22 0340  HGB 11.2* 10.7*  HCT 35.1* 33.1*  PLT 233 214  APTT 34  --   LABPROT 14.0  --   INR 1.1  --   HEPARINUNFRC  --  0.61  CREATININE 1.04* 1.08*  TROPONINIHS 7  --     Estimated Creatinine Clearance: 43.7 mL/min (A) (by C-G formula based on SCr of 1.08 mg/dL (H)).   Medical History: Past Medical History:  Diagnosis Date   Asthma    "pollen, grass" (04/20/2016)   Chronic lower back pain    Coronary artery disease    High cholesterol    Hypertension Dx 1990   Myocardial infarction [facility]) 2002   Pneumonia 04/2016   Sciatica 04/19/2016   Sciatica     Medications:  Medications Prior to Admission  Medication Sig Dispense Refill Last Dose   albuterol (VENTOLIN HFA) 108 (90 Base) MCG/ACT inhaler INHALE 1 TO 2 PUFFS EVERY 6 HOURS AS NEEDED FOR WHEEZING OR SHORTNESS OF BREATH (Patient taking differently: Inhale 1 puff into the lungs every 6 (six) hours as needed for shortness of breath.) 8 g 1 02/14/2022   aspirin 81 MG tablet Take 1 tablet (81 mg total) by mouth daily. 90 tablet 3 02/14/2022   calcium carbonate (OS-CAL) 1250 (500 Ca) MG chewable tablet Chew 1 tablet by mouth daily.   02/14/2022   cholecalciferol (VITAMIN D) 1000 units tablet Take 1,000 Units by mouth daily.   02/14/2022   clopidogrel (PLAVIX) 75 MG tablet Take 1 tablet (75 mg total) by mouth daily. 30 tablet 3 02/14/2022   clotrimazole-betamethasone (LOTRISONE)  cream APPLY TO AFFECTED AREA TWICE DAILY (Patient taking differently: Apply 1 Application topically 2 (two) times daily.) 45 g 1 02/14/2022   diltiazem (CARDIZEM) 60 MG tablet Take 60 mg by mouth 3 (three) times daily.   02/14/2022   enalapril (VASOTEC) 20 MG tablet Take 1 tablet (20 mg total) by mouth daily. 90 tablet 0 02/14/2022   fluticasone (FLONASE) 50 MCG/ACT nasal spray Place 1 spray into both nostrils daily. 11.1 mL 2 02/14/2022   isosorbide mononitrate (IMDUR) 60 MG 24 hr tablet Take 120 mg by mouth daily.   3 02/14/2022   KRILL OIL PO Take 1 capsule by mouth daily.   02/14/2022   metFORMIN (GLUCOPHAGE) 500 MG tablet Take 1 tablet (500 mg total) by mouth 2 (two) times daily. 180 tablet 3 02/14/2022   metoprolol succinate (TOPROL-XL) 100 MG 24 hr tablet Take 1 tablet (100 mg total) by mouth daily. Take with or immediately following a meal. (Patient taking differently: Take 150 mg by mouth daily. Take with or immediately following a meal.) 90 tablet 0 02/13/2022 at 1900   montelukast (SINGULAIR) 10 MG tablet TAKE 1 TABLET EVERY DAY 90 tablet 2 02/14/2022   NITROSTAT 0.4 MG SL tablet Place 1 tablet (0.4 mg total) under the tongue every 5 (five) minutes as needed for chest pain.  Reported on 10/20/2015 (Patient taking differently: Place 0.4 mg under the tongue every 5 (five) minutes as needed for chest pain.) 25 tablet 2 Past Week   QUNOL COQ10/UBIQUINOL/MEGA PO Take 5 mLs by mouth daily.   02/14/2022   rosuvastatin (CRESTOR) 20 MG tablet Take 1 tablet (20 mg total) by mouth daily. 90 tablet 3 02/14/2022   tiZANidine (ZANAFLEX) 2 MG tablet TAKE ONE TABLET BY MOUTH AT BEDTIME AS NEEDED (Patient taking differently: Take 2 mg by mouth at bedtime.) 30 tablet 1 02/13/2022   traMADol (ULTRAM) 50 MG tablet TAKE ONE TABLET BY MOUTH TWICE DAILY AS NEEDED. Fill ON OR AFTER 05/02/2920. Each prescription TO last ONE mth] (Patient taking differently: Take 50 mg by mouth every 6 (six) hours as needed for moderate pain.) 60  tablet 1 02/14/2022   pantoprazole (PROTONIX) 20 MG tablet TAKE 1 TABLET EVERY DAY (Patient not taking: Reported on 02/14/2022) 90 tablet 2 Not Taking    Assessment: 61 YOF admitted through the ED with a cc of recurrent chest pain. She does not take any form of anticoagulation in the outpatient setting. Pharmacy dosing heparin  -heparin level at goal -CBC stable  Goal of Therapy:  Heparin level 0.3-0.7 units/ml Monitor platelets by anticoagulation protocol: Yes   Plan:  -Continue heparin 850 units/hr -Recheck heparin level today -Will follow plans for cath  Harland German, PharmD Clinical Pharmacist **Pharmacist phone directory can now be found on [email or web address] ([email or web address]).  Listed under [facility].

## 2022-02-15 NOTE — Progress Notes (Signed)
Ref: Marcine Matar, MD   Subjective:  Awake. VS stable. 1st Troponin I is normal.Lipid panel is near normal. CXR is unremarkable. Feeling better post IV heparin drip  Objective:  Vital Signs in the last 24 hours: Temp:  [97.4 F (36.3 C)-98.3 F (36.8 C)] 98 F (36.7 C) (08/17 0920) Pulse Rate:  [64-73] 64 (08/17 0920) Cardiac Rhythm: Normal sinus rhythm;Heart block (08/17 0914) Resp:  [16-17] 17 (08/17 0920) BP: (97-142)/(58-92) 104/64 (08/17 0920) SpO2:  [96 %-98 %] 96 % (08/17 0920) Weight:  [74 kg] 74 kg (08/16 2106)  Physical Exam: BP Readings from Last 1 Encounters:  02/15/22 104/64     Wt Readings from Last 1 Encounters:  02/14/22 74 kg    Weight change:  Body mass index is 28 kg/m. HEENT: [place]/AT, Eyes-Brown, Conjunctiva-Pink, Sclera-Non-icteric Neck: No JVD, No bruit, Trachea midline. Lungs:  Clear, Bilateral. Cardiac:  Regular rhythm, normal S1 and S2, no S3. II/VI systolic murmur. Abdomen:  Soft, non-tender. BS present. Extremities:  No edema present. No cyanosis. No clubbing. CNS: AxOx3, Cranial nerves grossly intact, moves all 4 extremities.  Skin: Warm and dry.   Intake/Output from previous day: 08/16 0701 - 08/17 0700 In: 146.8 [I.V.:146.8] Out: -     Lab Results: BMET    Component Value Date/Time   NA 139 02/15/2022 0340   NA 140 02/14/2022 2127   NA 144 03/11/2019 0914   NA 138 04/10/2018 0356   NA 143 03/13/2018 1544   NA 143 05/06/2017 1555   K 4.3 02/15/2022 0340   K 4.3 02/14/2022 2127   K 4.3 03/11/2019 0914   CL 107 02/15/2022 0340   CL 106 02/14/2022 2127   CL 104 03/11/2019 0914   CO2 25 02/15/2022 0340   CO2 27 02/14/2022 2127   CO2 27 03/11/2019 0914   GLUCOSE 106 (H) 02/15/2022 0340   GLUCOSE 126 (H) 02/14/2022 2127   GLUCOSE 104 (H) 03/11/2019 0914   GLUCOSE 127 (H) 04/10/2018 0356   BUN 12 02/15/2022 0340   BUN 11 02/14/2022 2127   BUN 7 (L) 03/11/2019 0914   BUN 12 04/10/2018 0356   BUN 13 03/13/2018 1544    BUN 10 05/06/2017 1555   CREATININE 1.08 (H) 02/15/2022 0340   CREATININE 1.04 (H) 02/14/2022 2127   CREATININE 0.70 03/11/2019 0914   CREATININE 0.71 05/06/2015 0904   CALCIUM 9.3 02/15/2022 0340   CALCIUM 9.4 02/14/2022 2127   CALCIUM 9.7 03/11/2019 0914   GFRNONAA 53 (L) 02/15/2022 0340   GFRNONAA 56 (L) 02/14/2022 2127   GFRNONAA 86 03/11/2019 0914   GFRNONAA >60 04/10/2018 0356   GFRNONAA >60 04/09/2018 0009   GFRNONAA 87 05/06/2015 0904   GFRAA 99 03/11/2019 0914   GFRAA >60 04/10/2018 0356   GFRAA >60 04/09/2018 0009   GFRAA >89 05/06/2015 0904   CBC    Component Value Date/Time   WBC 7.2 02/15/2022 0340   RBC 3.74 (L) 02/15/2022 0340   HGB 10.7 (L) 02/15/2022 0340   HGB 12.6 03/11/2019 0914   HCT 33.1 (L) 02/15/2022 0340   HCT 39.8 03/11/2019 0914   PLT 214 02/15/2022 0340   PLT 214 03/11/2019 0914   MCV 88.5 02/15/2022 0340   MCV 87 03/11/2019 0914   MCH 28.6 02/15/2022 0340   MCHC 32.3 02/15/2022 0340   RDW 13.8 02/15/2022 0340   RDW 14.2 03/11/2019 0914   LYMPHSABS 2.4 02/14/2022 2127   MONOABS 0.7 02/14/2022 2127   EOSABS 0.5 02/14/2022 2127  BASOSABS 0.1 02/14/2022 2127   HEPATIC Function Panel Recent Labs    02/14/22 2127  PROT 7.3  ALBUMIN 3.7  AST 16  ALT 11  ALKPHOS 48   HEMOGLOBIN A1C No results found for: "MPG" CARDIAC ENZYMES Lab Results  Component Value Date   TROPONINI 0.38 (HH) 04/09/2018   TROPONINI 0.36 (HH) 04/08/2018   TROPONINI 0.20 (HH) 04/08/2018   BNP No results for input(s): "PROBNP" in the last 8760 hours. TSH No results for input(s): "TSH" in the last 8760 hours. CHOLESTEROL Recent Labs    02/15/22 0340  CHOL 117    Scheduled Meds:  aspirin EC  81 mg Oral Daily   calcium carbonate  1,250 mg Oral Daily   cholecalciferol  1,000 Units Oral Daily   clopidogrel  75 mg Oral Daily   clotrimazole-betamethasone  1 Application Topical BID   diltiazem  60 mg Oral TID   enalapril  20 mg Oral Daily   fluticasone  1  spray Each Nare Daily   isosorbide mononitrate  120 mg Oral Daily   metFORMIN  500 mg Oral BID   metoprolol succinate  100 mg Oral Daily   metoprolol succinate  50 mg Oral QHS   montelukast  10 mg Oral Daily   pantoprazole  20 mg Oral Daily   rosuvastatin  20 mg Oral Daily   tiZANidine  2 mg Oral QHS   Continuous Infusions:  sodium chloride 30 mL/hr at 02/15/22 0300   heparin 850 Units/hr (02/15/22 0300)   PRN Meds:.acetaminophen, albuterol, nitroGLYCERIN, ondansetron (ZOFRAN) IV, traMADol  Assessment/Plan:  Unstable angina CAD S/P CABG HTN HLD GERD  Plan: Consider cardiac cath tomorrow. Awaiting echocardiogram and troponin I.   LOS: 1 day   Time spent including chart review, lab review, examination, discussion with patient/Nurse : 30 min   Orpah Cobb  MD  02/15/2022, 10:19 AM

## 2022-02-15 NOTE — H&P (View-Only) (Signed)
Ref: Marcine Matar, MD   Subjective:  Awake. VS stable. 1st Troponin I is normal.Lipid panel is near normal. CXR is unremarkable. Feeling better post IV heparin drip  Objective:  Vital Signs in the last 24 hours: Temp:  [97.4 F (36.3 C)-98.3 F (36.8 C)] 98 F (36.7 C) (08/17 0920) Pulse Rate:  [64-73] 64 (08/17 0920) Cardiac Rhythm: Normal sinus rhythm;Heart block (08/17 0914) Resp:  [16-17] 17 (08/17 0920) BP: (97-142)/(58-92) 104/64 (08/17 0920) SpO2:  [96 %-98 %] 96 % (08/17 0920) Weight:  [74 kg] 74 kg (08/16 2106)  Physical Exam: BP Readings from Last 1 Encounters:  02/15/22 104/64     Wt Readings from Last 1 Encounters:  02/14/22 74 kg    Weight change:  Body mass index is 28 kg/m. HEENT: [place]/AT, Eyes-Brown, Conjunctiva-Pink, Sclera-Non-icteric Neck: No JVD, No bruit, Trachea midline. Lungs:  Clear, Bilateral. Cardiac:  Regular rhythm, normal S1 and S2, no S3. II/VI systolic murmur. Abdomen:  Soft, non-tender. BS present. Extremities:  No edema present. No cyanosis. No clubbing. CNS: AxOx3, Cranial nerves grossly intact, moves all 4 extremities.  Skin: Warm and dry.   Intake/Output from previous day: 08/16 0701 - 08/17 0700 In: 146.8 [I.V.:146.8] Out: -     Lab Results: BMET    Component Value Date/Time   NA 139 02/15/2022 0340   NA 140 02/14/2022 2127   NA 144 03/11/2019 0914   NA 138 04/10/2018 0356   NA 143 03/13/2018 1544   NA 143 05/06/2017 1555   K 4.3 02/15/2022 0340   K 4.3 02/14/2022 2127   K 4.3 03/11/2019 0914   CL 107 02/15/2022 0340   CL 106 02/14/2022 2127   CL 104 03/11/2019 0914   CO2 25 02/15/2022 0340   CO2 27 02/14/2022 2127   CO2 27 03/11/2019 0914   GLUCOSE 106 (H) 02/15/2022 0340   GLUCOSE 126 (H) 02/14/2022 2127   GLUCOSE 104 (H) 03/11/2019 0914   GLUCOSE 127 (H) 04/10/2018 0356   BUN 12 02/15/2022 0340   BUN 11 02/14/2022 2127   BUN 7 (L) 03/11/2019 0914   BUN 12 04/10/2018 0356   BUN 13 03/13/2018 1544    BUN 10 05/06/2017 1555   CREATININE 1.08 (H) 02/15/2022 0340   CREATININE 1.04 (H) 02/14/2022 2127   CREATININE 0.70 03/11/2019 0914   CREATININE 0.71 05/06/2015 0904   CALCIUM 9.3 02/15/2022 0340   CALCIUM 9.4 02/14/2022 2127   CALCIUM 9.7 03/11/2019 0914   GFRNONAA 53 (L) 02/15/2022 0340   GFRNONAA 56 (L) 02/14/2022 2127   GFRNONAA 86 03/11/2019 0914   GFRNONAA >60 04/10/2018 0356   GFRNONAA >60 04/09/2018 0009   GFRNONAA 87 05/06/2015 0904   GFRAA 99 03/11/2019 0914   GFRAA >60 04/10/2018 0356   GFRAA >60 04/09/2018 0009   GFRAA >89 05/06/2015 0904   CBC    Component Value Date/Time   WBC 7.2 02/15/2022 0340   RBC 3.74 (L) 02/15/2022 0340   HGB 10.7 (L) 02/15/2022 0340   HGB 12.6 03/11/2019 0914   HCT 33.1 (L) 02/15/2022 0340   HCT 39.8 03/11/2019 0914   PLT 214 02/15/2022 0340   PLT 214 03/11/2019 0914   MCV 88.5 02/15/2022 0340   MCV 87 03/11/2019 0914   MCH 28.6 02/15/2022 0340   MCHC 32.3 02/15/2022 0340   RDW 13.8 02/15/2022 0340   RDW 14.2 03/11/2019 0914   LYMPHSABS 2.4 02/14/2022 2127   MONOABS 0.7 02/14/2022 2127   EOSABS 0.5 02/14/2022 2127  BASOSABS 0.1 02/14/2022 2127   HEPATIC Function Panel Recent Labs    02/14/22 2127  PROT 7.3  ALBUMIN 3.7  AST 16  ALT 11  ALKPHOS 48   HEMOGLOBIN A1C No results found for: "MPG" CARDIAC ENZYMES Lab Results  Component Value Date   TROPONINI 0.38 (HH) 04/09/2018   TROPONINI 0.36 (HH) 04/08/2018   TROPONINI 0.20 (HH) 04/08/2018   BNP No results for input(s): "PROBNP" in the last 8760 hours. TSH No results for input(s): "TSH" in the last 8760 hours. CHOLESTEROL Recent Labs    02/15/22 0340  CHOL 117    Scheduled Meds:  aspirin EC  81 mg Oral Daily   calcium carbonate  1,250 mg Oral Daily   cholecalciferol  1,000 Units Oral Daily   clopidogrel  75 mg Oral Daily   clotrimazole-betamethasone  1 Application Topical BID   diltiazem  60 mg Oral TID   enalapril  20 mg Oral Daily   fluticasone  1  spray Each Nare Daily   isosorbide mononitrate  120 mg Oral Daily   metFORMIN  500 mg Oral BID   metoprolol succinate  100 mg Oral Daily   metoprolol succinate  50 mg Oral QHS   montelukast  10 mg Oral Daily   pantoprazole  20 mg Oral Daily   rosuvastatin  20 mg Oral Daily   tiZANidine  2 mg Oral QHS   Continuous Infusions:  sodium chloride 30 mL/hr at 02/15/22 0300   heparin 850 Units/hr (02/15/22 0300)   PRN Meds:.acetaminophen, albuterol, nitroGLYCERIN, ondansetron (ZOFRAN) IV, traMADol  Assessment/Plan:  Unstable angina CAD S/P CABG HTN HLD GERD  Plan: Consider cardiac cath tomorrow. Awaiting echocardiogram and troponin I.   LOS: 1 day   Time spent including chart review, lab review, examination, discussion with patient/Nurse : 30 min   Sierra Gottlieb  MD  02/15/2022, 10:19 AM     

## 2022-02-15 NOTE — Progress Notes (Signed)
Echocardiogram 2D Echocardiogram has been performed.  Augustine Radar 02/15/2022, 11:47 AM

## 2022-02-16 ENCOUNTER — Encounter (HOSPITAL_COMMUNITY)
Admission: RE | Disposition: A | Discharge status: Home / Self Care | Payer: Self-pay | Source: Ambulatory Visit | Attending: Cardiovascular Disease

## 2022-02-16 ENCOUNTER — Encounter (HOSPITAL_COMMUNITY): Payer: Self-pay | Admitting: Cardiovascular Disease

## 2022-02-16 HISTORY — PX: LEFT HEART CATH AND CORS/GRAFTS ANGIOGRAPHY: CATH118250

## 2022-02-16 LAB — GLUCOSE, CAPILLARY
Glucose-Capillary: 107 mg/dL — ABNORMAL HIGH (ref 70–99)
Glucose-Capillary: 87 mg/dL (ref 70–99)
Glucose-Capillary: 97 mg/dL (ref 70–99)
Glucose-Capillary: 155 mg/dL — ABNORMAL HIGH (ref 70–99)
Glucose-Capillary: 128 mg/dL — ABNORMAL HIGH (ref 70–99)
Glucose-Capillary: 96 mg/dL (ref 70–99)

## 2022-02-16 LAB — LIPOPROTEIN A (LPA): Lipoprotein (a): 242.7 nmol/L — ABNORMAL HIGH (ref <75.0)

## 2022-02-16 LAB — CBC
HCT: 33.4 % — ABNORMAL LOW (ref 36.0–46.0)
Hemoglobin: 10.7 g/dL — ABNORMAL LOW (ref 12.0–15.0)
MCH: 28.5 pg (ref 26.0–34.0)
MCHC: 32 g/dL (ref 30.0–36.0)
MCV: 89.1 fL (ref 80.0–100.0)
Platelets: 199 10*3/uL (ref 150–400)
RBC: 3.75 MIL/uL — ABNORMAL LOW (ref 3.87–5.11)
RDW: 13.9 % (ref 11.5–15.5)
WBC: 6.8 10*3/uL (ref 4.0–10.5)
nRBC: 0 % (ref 0.0–0.2)

## 2022-02-16 LAB — HEPARIN LEVEL (UNFRACTIONATED): Heparin Unfractionated: 0.57 IU/mL (ref 0.30–0.70)

## 2022-02-16 SURGERY — LEFT HEART CATH AND CORS/GRAFTS ANGIOGRAPHY
Anesthesia: LOCAL

## 2022-02-16 MED ORDER — FENTANYL CITRATE (PF) 100 MCG/2ML IJ SOLN
INTRAMUSCULAR | Status: AC
  Filled 2022-02-16: qty 2

## 2022-02-16 MED ORDER — MIDAZOLAM HCL 2 MG/2ML IJ SOLN
INTRAMUSCULAR | Status: AC
  Filled 2022-02-16: qty 2

## 2022-02-16 MED ORDER — FENTANYL CITRATE (PF) 100 MCG/2ML IJ SOLN
INTRAMUSCULAR | Status: DC | PRN
  Administered 2022-02-16: 25 ug via INTRAVENOUS

## 2022-02-16 MED ORDER — HEPARIN (PORCINE) IN NACL 1000-0.9 UT/500ML-% IV SOLN
INTRAVENOUS | Status: DC | PRN
  Administered 2022-02-16 (×2): 500 mL

## 2022-02-16 MED ORDER — HEPARIN (PORCINE) IN NACL 1000-0.9 UT/500ML-% IV SOLN
INTRAVENOUS | Status: AC
  Filled 2022-02-16: qty 1000

## 2022-02-16 MED ORDER — VERAPAMIL HCL 2.5 MG/ML IV SOLN
INTRAVENOUS | Status: AC
  Filled 2022-02-16: qty 2

## 2022-02-16 MED ORDER — LIDOCAINE HCL (PF) 1 % IJ SOLN
INTRAMUSCULAR | Status: DC | PRN
  Administered 2022-02-16: 10 mL

## 2022-02-16 MED ORDER — SODIUM CHLORIDE 0.9 % IV SOLN: 250.0000 mL | INTRAVENOUS | Status: DC | PRN

## 2022-02-16 MED ORDER — IOHEXOL 350 MG/ML SOLN
INTRAVENOUS | Status: DC | PRN
  Administered 2022-02-16: 40 mL

## 2022-02-16 MED ORDER — LABETALOL HCL 5 MG/ML IV SOLN: 10.0000 mg | INTRAVENOUS | Status: AC | PRN

## 2022-02-16 MED ORDER — MIDAZOLAM HCL 2 MG/2ML IJ SOLN
INTRAMUSCULAR | Status: DC | PRN
  Administered 2022-02-16: 1 mg via INTRAVENOUS

## 2022-02-16 MED ORDER — LIDOCAINE HCL (PF) 1 % IJ SOLN
INTRAMUSCULAR | Status: AC
  Filled 2022-02-16: qty 30

## 2022-02-16 MED ORDER — SODIUM CHLORIDE 0.9% FLUSH: 3.0000 mL | Freq: Two times a day (BID) | INTRAVENOUS | Status: DC

## 2022-02-16 MED ORDER — SODIUM CHLORIDE 0.9% FLUSH: 3.0000 mL | INTRAVENOUS | Status: DC | PRN

## 2022-02-16 MED ORDER — SODIUM CHLORIDE 0.9 % IV SOLN: INTRAVENOUS | Status: AC

## 2022-02-16 MED ORDER — HYDRALAZINE HCL 20 MG/ML IJ SOLN: 10.0000 mg | INTRAMUSCULAR | Status: AC | PRN

## 2022-02-16 SURGICAL SUPPLY — 8 items
CATH INFINITI 5FR MULTPACK ANG (CATHETERS) IMPLANT
ELECT DEFIB PAD ADLT CADENCE (PAD) IMPLANT
KIT HEART LEFT (KITS) ×1 IMPLANT
PACK CARDIAC CATHETERIZATION (CUSTOM PROCEDURE TRAY) ×1 IMPLANT
SHEATH PINNACLE 5F 10CM (SHEATH) IMPLANT
SYR MEDRAD MARK 7 150ML (SYRINGE) ×1 IMPLANT
TRANSDUCER W/STOPCOCK (MISCELLANEOUS) ×1 IMPLANT
WIRE EMERALD 3MM-J .035X150CM (WIRE) IMPLANT

## 2022-02-16 NOTE — Progress Notes (Addendum)
SITE AREA: right femoral/groin  SITE PRIOR TO REMOVAL:  LEVEL 0  PRESSURE APPLIED FOR: approximately 20 minutes  MANUAL: yes  PATIENT STATUS DURING PULL: stable  POST PULL SITE:  LEVEL 0  POST PULL INSTRUCTIONS GIVEN: yes  POST PULL PULSES PRESENT: bilateral pedal pulses at +2  DRESSING APPLIED: gauze with tegaderm  BEDREST BEGINS @ 1026  COMMENTS:  purewick placed prior to sheath removal, tolerated well

## 2022-02-16 NOTE — Care Management (Signed)
  Transition of Care [facility]) Screening Note   Patient Details  Name: [name] Date of Birth: [birth year]   Transition of Care [facility]) CM/SW Contact:    Gala Lewandowsky, RN Phone Number: 02/16/2022, 1:31 PM    Transition of Care Department [facility]) has reviewed the patient and no TOC needs have been identified at this time. We will continue to monitor patient advancement through interdisciplinary progression rounds. If new patient transition needs arise, please place a TOC consult.

## 2022-02-16 NOTE — Progress Notes (Signed)
Mobility Specialist Progress Note    02/16/22 1513  Mobility  Activity Ambulated with assistance in room  Level of Assistance Contact guard assist, steadying assist  Assistive Device None  Distance Ambulated (ft) 400 ft  Activity Response Tolerated well  $Mobility charge 1 Mobility   Pre-Mobility: 72 HR Post-Mobility: 76 HR  Pt received in bed and agreeable. C/o feeling a little weak in the knees and nauseous. Returned to bed with call bell in reach.    Geronimo Nation Mobility Specialist

## 2022-02-16 NOTE — Interval H&P Note (Signed)
History and Physical Interval Note:  02/16/2022 9:00 AM  [name]  has presented today for surgery, with the diagnosis of nstemi.  The various methods of treatment have been discussed with the patient and family. After consideration of risks, benefits and other options for treatment, the patient has consented to  Procedure(s): LEFT HEART CATH AND CORS/GRAFTS ANGIOGRAPHY (N/A) as a surgical intervention.  The patient's history has been reviewed, patient examined, no change in status, stable for surgery.  I have reviewed the patient's chart and labs.  Questions were answered to the patient's satisfaction.     Ricki Rodriguez

## 2022-02-16 NOTE — Care Management Important Message (Signed)
Important Message  Patient Details  Name: [name] MRN: [id number] Date of Birth: 1945/07/13   Medicare Important Message Given:  Yes     Dorena Bodo 02/16/2022, 2:09 PM

## 2022-02-17 LAB — CBC
HCT: 33.2 % — ABNORMAL LOW (ref 36.0–46.0)
Hemoglobin: 10.8 g/dL — ABNORMAL LOW (ref 12.0–15.0)
MCH: 28.3 pg (ref 26.0–34.0)
MCHC: 32.5 g/dL (ref 30.0–36.0)
MCV: 87.1 fL (ref 80.0–100.0)
Platelets: 198 10*3/uL (ref 150–400)
RBC: 3.81 MIL/uL — ABNORMAL LOW (ref 3.87–5.11)
RDW: 13.8 % (ref 11.5–15.5)
WBC: 6 10*3/uL (ref 4.0–10.5)
nRBC: 0 % (ref 0.0–0.2)

## 2022-02-17 LAB — GLUCOSE, CAPILLARY: Glucose-Capillary: 168 mg/dL — ABNORMAL HIGH (ref 70–99)

## 2022-02-17 NOTE — Discharge Summary (Signed)
Physician Discharge Summary  Patient ID: [name] MRN: [id number] DOB/AGE: [id number] 76 y.o.  Admit date: 02/14/2022 Discharge date: 02/17/2022  Admission Diagnoses: Acute coronary syndrome CAD CABG HTN HLD GERD  Discharge Diagnoses:  Principal Problem:   Acute coronary syndrome (HCC) Active problem:   Multivessel, native vessel CAD   Coronary bypass graft disease   CABG   HTN   HL:D   GERD   Obesity   Type 2 DM   CKD, II  Discharged Condition: fair  Hospital Course: 76 yars old female with CAD, CABG, HTN, HLD and type 2 Dm has recurrent chest pain with and without activity. She underwent cardiac catheterization that showed severe LM, proximal LAD and LCX and RCA with calcific disease with total occlusion of revered SVGs to distal RCA and OM. LIMA to LAD was patent and there was extensive collateral circulation to distal RCA and LCX. As such review with interventionalist suggested continuing medical therapy. Patient was advised to continue medical therapy and reduce work load as much as possible. She will see me in 1-2 weeks and primary care in 1 month.  Consults: cardiology  Significant Diagnostic Studies: labs: Near normal CBC except Hgb of 10.8 gm. BMET near normal except blood sugar of 126 mg. And creatinine of 1.04 to 1.08 mg. Lipid panel near normal with LDL cholesterol of 66 mg. Hgb A1C was 6.2 %. LP(a) was elevated at 242.7 nmol.  Treatments: cardiac meds: Aspirin, Clopidogrel, Isosorbide, enalapril, metoprolol, diltiazem, and rosuvastatin.  Discharge Exam: Blood pressure 100/75, pulse 73, temperature 98 F (36.7 C), temperature source Oral, resp. rate 18, height 5\' 4"  (1.626 m), weight 74.1 kg, SpO2 97 %. General appearance: alert, cooperative and appears stated age. Head: Normocephalic, atraumatic. Eyes: Brown eyes, pink conjunctiva, corneas clear.   Neck: No adenopathy, no carotid bruit, no JVD, supple, symmetrical, trachea midline and thyroid not  enlarged. Resp: Clear to auscultation bilaterally. Cardio: Regular rate and rhythm, S1, S2 normal, II/VI systolic murmur, no click, rub or gallop. GI: Soft, non-tender; bowel sounds normal; no organomegaly. Extremities: No edema, cyanosis or clubbing. Right groin site is stable. Skin: Warm and dry.  Neurologic: Alert and oriented X 3, normal strength and tone. Normal coordination and gait.  Disposition: Discharge disposition: 01-Home or Self Care        Allergies as of 02/17/2022       Reactions   Statins Other (See Comments)   Muscle aches    Codeine Other (See Comments)   Bleeding in urine        Medication List     TAKE these medications    albuterol 108 (90 Base) MCG/ACT inhaler Commonly known as: VENTOLIN HFA INHALE 1 TO 2 PUFFS EVERY 6 HOURS AS NEEDED FOR WHEEZING OR SHORTNESS OF BREATH What changed: See the new instructions.   aspirin 81 MG tablet Take 1 tablet (81 mg total) by mouth daily.   calcium carbonate 1250 (500 Ca) MG chewable tablet Commonly known as: OS-CAL Chew 1 tablet by mouth daily.   cholecalciferol 1000 units tablet Commonly known as: VITAMIN D Take 1,000 Units by mouth daily.   clopidogrel 75 MG tablet Commonly known as: Plavix Take 1 tablet (75 mg total) by mouth daily.   clotrimazole-betamethasone cream Commonly known as: LOTRISONE APPLY TO AFFECTED AREA TWICE DAILY What changed: See the new instructions.   diltiazem 60 MG tablet Commonly known as: CARDIZEM Take 60 mg by mouth 3 (three) times daily.   enalapril 20 MG tablet Commonly known  as: VASOTEC Take 1 tablet (20 mg total) by mouth daily.   fluticasone 50 MCG/ACT nasal spray Commonly known as: FLONASE Place 1 spray into both nostrils daily.   isosorbide mononitrate 60 MG 24 hr tablet Commonly known as: IMDUR Take 120 mg by mouth daily.   KRILL OIL PO Take 1 capsule by mouth daily.   metFORMIN 500 MG tablet Commonly known as: GLUCOPHAGE Take 1 tablet (500 mg  total) by mouth 2 (two) times daily.   metoprolol succinate 100 MG 24 hr tablet Commonly known as: TOPROL-XL Take 1 tablet (100 mg total) by mouth daily. Take with or immediately following a meal. What changed: how much to take   montelukast 10 MG tablet Commonly known as: SINGULAIR TAKE 1 TABLET EVERY DAY   Nitrostat 0.4 MG SL tablet Generic drug: nitroGLYCERIN Place 1 tablet (0.4 mg total) under the tongue every 5 (five) minutes as needed for chest pain. Reported on 10/20/2015 What changed: additional instructions   pantoprazole 20 MG tablet Commonly known as: PROTONIX TAKE 1 TABLET EVERY DAY   QUNOL COQ10/UBIQUINOL/MEGA PO Take 5 mLs by mouth daily.   rosuvastatin 20 MG tablet Commonly known as: Crestor Take 1 tablet (20 mg total) by mouth daily.   tiZANidine 2 MG tablet Commonly known as: ZANAFLEX TAKE ONE TABLET BY MOUTH AT BEDTIME AS NEEDED What changed: when to take this   traMADol 50 MG tablet Commonly known as: ULTRAM TAKE ONE TABLET BY MOUTH TWICE DAILY AS NEEDED. Fill ON OR AFTER 05/02/2920. Each prescription TO last ONE mth] What changed:  how much to take how to take this when to take this reasons to take this additional instructions        Follow-up Information     Marcine Matar, MD Follow up in 1 month(s).   Specialty: Internal Medicine Contact information: [address] [place] [address] [phone]         Orpah Cobb, MD Follow up in 1 week(s).   Specialty: Cardiology Why: 1-2 weeks. Contact information: [address] [place] [address] [phone]                 Time spent: Review of old chart, current chart, lab, x-ray, cardiac tests and discussion with patient over 60 minutes.  Signed: Ricki Rodriguez 02/17/2022, 11:34 AM

## 2022-02-21 DIAGNOSIS — R7303 Prediabetes: Secondary | ICD-10-CM | POA: Diagnosis not present

## 2022-02-21 DIAGNOSIS — E785 Hyperlipidemia, unspecified: Secondary | ICD-10-CM | POA: Diagnosis not present

## 2022-02-21 DIAGNOSIS — N183 Chronic kidney disease, stage 3 unspecified: Secondary | ICD-10-CM | POA: Diagnosis not present

## 2022-02-21 DIAGNOSIS — M545 Low back pain, unspecified: Secondary | ICD-10-CM | POA: Diagnosis not present

## 2022-02-21 DIAGNOSIS — D649 Anemia, unspecified: Secondary | ICD-10-CM | POA: Diagnosis not present

## 2022-02-21 DIAGNOSIS — I2510 Atherosclerotic heart disease of native coronary artery without angina pectoris: Secondary | ICD-10-CM | POA: Diagnosis not present

## 2022-02-21 DIAGNOSIS — M25512 Pain in left shoulder: Secondary | ICD-10-CM | POA: Diagnosis not present

## 2022-02-21 DIAGNOSIS — J309 Allergic rhinitis, unspecified: Secondary | ICD-10-CM | POA: Diagnosis not present

## 2022-02-21 DIAGNOSIS — N390 Urinary tract infection, site not specified: Secondary | ICD-10-CM | POA: Diagnosis not present

## 2022-02-21 DIAGNOSIS — I10 Essential (primary) hypertension: Secondary | ICD-10-CM | POA: Diagnosis not present

## 2022-02-22 ENCOUNTER — Ambulatory Visit
Admission: RE | Admit: 2022-02-22 | Discharge: 2022-02-22 | Disposition: A | Discharge status: Home / Self Care | Payer: Medicare Other | Source: Ambulatory Visit | Attending: Internal Medicine | Admitting: Internal Medicine

## 2022-02-22 ENCOUNTER — Other Ambulatory Visit: Payer: Self-pay | Admitting: Internal Medicine

## 2022-02-22 DIAGNOSIS — M25512 Pain in left shoulder: Secondary | ICD-10-CM

## 2022-05-09 DIAGNOSIS — J309 Allergic rhinitis, unspecified: Secondary | ICD-10-CM | POA: Diagnosis not present

## 2022-05-09 DIAGNOSIS — M25512 Pain in left shoulder: Secondary | ICD-10-CM | POA: Diagnosis not present

## 2022-05-09 DIAGNOSIS — R7303 Prediabetes: Secondary | ICD-10-CM | POA: Diagnosis not present

## 2022-05-09 DIAGNOSIS — D649 Anemia, unspecified: Secondary | ICD-10-CM | POA: Diagnosis not present

## 2022-05-09 DIAGNOSIS — I10 Essential (primary) hypertension: Secondary | ICD-10-CM | POA: Diagnosis not present

## 2022-05-16 DIAGNOSIS — E119 Type 2 diabetes mellitus without complications: Secondary | ICD-10-CM | POA: Diagnosis not present

## 2022-05-16 DIAGNOSIS — I2510 Atherosclerotic heart disease of native coronary artery without angina pectoris: Secondary | ICD-10-CM | POA: Diagnosis not present

## 2022-05-16 DIAGNOSIS — R072 Precordial pain: Secondary | ICD-10-CM | POA: Diagnosis not present

## 2022-05-16 DIAGNOSIS — I340 Nonrheumatic mitral (valve) insufficiency: Secondary | ICD-10-CM | POA: Diagnosis not present

## 2022-05-30 DIAGNOSIS — N390 Urinary tract infection, site not specified: Secondary | ICD-10-CM | POA: Diagnosis not present

## 2022-05-30 DIAGNOSIS — R350 Frequency of micturition: Secondary | ICD-10-CM | POA: Diagnosis not present

## 2022-05-30 DIAGNOSIS — B952 Enterococcus as the cause of diseases classified elsewhere: Secondary | ICD-10-CM | POA: Diagnosis not present

## 2022-06-06 DIAGNOSIS — M7502 Adhesive capsulitis of left shoulder: Secondary | ICD-10-CM | POA: Diagnosis not present

## 2022-06-06 DIAGNOSIS — M25512 Pain in left shoulder: Secondary | ICD-10-CM | POA: Diagnosis not present

## 2022-06-20 DIAGNOSIS — M25512 Pain in left shoulder: Secondary | ICD-10-CM | POA: Diagnosis not present

## 2022-07-06 DIAGNOSIS — M25512 Pain in left shoulder: Secondary | ICD-10-CM | POA: Diagnosis not present

## 2022-07-20 DIAGNOSIS — M25512 Pain in left shoulder: Secondary | ICD-10-CM | POA: Diagnosis not present

## 2022-07-25 DIAGNOSIS — H52201 Unspecified astigmatism, right eye: Secondary | ICD-10-CM | POA: Diagnosis not present

## 2022-07-25 DIAGNOSIS — H04123 Dry eye syndrome of bilateral lacrimal glands: Secondary | ICD-10-CM | POA: Diagnosis not present

## 2022-07-25 DIAGNOSIS — H524 Presbyopia: Secondary | ICD-10-CM | POA: Diagnosis not present

## 2022-07-25 DIAGNOSIS — R7303 Prediabetes: Secondary | ICD-10-CM | POA: Diagnosis not present

## 2022-07-25 DIAGNOSIS — H43813 Vitreous degeneration, bilateral: Secondary | ICD-10-CM | POA: Diagnosis not present

## 2022-07-25 DIAGNOSIS — H5212 Myopia, left eye: Secondary | ICD-10-CM | POA: Diagnosis not present

## 2022-08-20 DIAGNOSIS — S76011A Strain of muscle, fascia and tendon of right hip, initial encounter: Secondary | ICD-10-CM | POA: Diagnosis not present

## 2022-08-20 DIAGNOSIS — M546 Pain in thoracic spine: Secondary | ICD-10-CM | POA: Diagnosis not present

## 2022-08-21 ENCOUNTER — Other Ambulatory Visit: Payer: Self-pay | Admitting: Family Medicine

## 2022-08-21 ENCOUNTER — Ambulatory Visit
Admission: RE | Admit: 2022-08-21 | Discharge: 2022-08-21 | Disposition: A | Discharge status: Home / Self Care | Payer: 59 | Source: Ambulatory Visit | Attending: Family Medicine | Admitting: Family Medicine

## 2022-08-21 DIAGNOSIS — M546 Pain in thoracic spine: Secondary | ICD-10-CM

## 2022-08-21 DIAGNOSIS — M1611 Unilateral primary osteoarthritis, right hip: Secondary | ICD-10-CM | POA: Diagnosis not present

## 2022-08-24 DIAGNOSIS — N183 Chronic kidney disease, stage 3 unspecified: Secondary | ICD-10-CM | POA: Diagnosis not present

## 2022-08-24 DIAGNOSIS — B372 Candidiasis of skin and nail: Secondary | ICD-10-CM | POA: Diagnosis not present

## 2022-08-24 DIAGNOSIS — M25551 Pain in right hip: Secondary | ICD-10-CM | POA: Diagnosis not present

## 2022-08-24 DIAGNOSIS — M1611 Unilateral primary osteoarthritis, right hip: Secondary | ICD-10-CM | POA: Diagnosis not present

## 2022-08-27 ENCOUNTER — Encounter: Payer: Self-pay | Admitting: Family Medicine

## 2022-08-27 ENCOUNTER — Other Ambulatory Visit: Payer: Self-pay | Admitting: Family Medicine

## 2022-08-27 DIAGNOSIS — W19XXXA Unspecified fall, initial encounter: Secondary | ICD-10-CM

## 2022-08-27 DIAGNOSIS — I2510 Atherosclerotic heart disease of native coronary artery without angina pectoris: Secondary | ICD-10-CM | POA: Diagnosis not present

## 2022-08-27 DIAGNOSIS — R55 Syncope and collapse: Secondary | ICD-10-CM | POA: Diagnosis not present

## 2022-08-27 DIAGNOSIS — R519 Headache, unspecified: Secondary | ICD-10-CM | POA: Diagnosis not present

## 2022-08-27 DIAGNOSIS — E119 Type 2 diabetes mellitus without complications: Secondary | ICD-10-CM | POA: Diagnosis not present

## 2022-08-27 DIAGNOSIS — I340 Nonrheumatic mitral (valve) insufficiency: Secondary | ICD-10-CM | POA: Diagnosis not present

## 2022-08-28 ENCOUNTER — Ambulatory Visit
Admission: RE | Admit: 2022-08-28 | Discharge: 2022-08-28 | Disposition: A | Discharge status: Home / Self Care | Payer: 59 | Source: Ambulatory Visit | Attending: Family Medicine | Admitting: Family Medicine

## 2022-08-28 DIAGNOSIS — W19XXXA Unspecified fall, initial encounter: Secondary | ICD-10-CM

## 2022-08-28 DIAGNOSIS — R42 Dizziness and giddiness: Secondary | ICD-10-CM | POA: Diagnosis not present

## 2022-08-28 DIAGNOSIS — R55 Syncope and collapse: Secondary | ICD-10-CM | POA: Diagnosis not present

## 2022-09-04 ENCOUNTER — Other Ambulatory Visit: Payer: Self-pay | Admitting: Family Medicine

## 2022-09-04 DIAGNOSIS — W19XXXA Unspecified fall, initial encounter: Secondary | ICD-10-CM

## 2022-09-05 ENCOUNTER — Ambulatory Visit
Admission: RE | Admit: 2022-09-05 | Discharge: 2022-09-05 | Disposition: A | Discharge status: Home / Self Care | Payer: 59 | Source: Ambulatory Visit | Attending: Family Medicine | Admitting: Family Medicine

## 2022-09-05 DIAGNOSIS — R350 Frequency of micturition: Secondary | ICD-10-CM | POA: Diagnosis not present

## 2022-09-05 DIAGNOSIS — N390 Urinary tract infection, site not specified: Secondary | ICD-10-CM | POA: Diagnosis not present

## 2022-09-05 DIAGNOSIS — W19XXXA Unspecified fall, initial encounter: Secondary | ICD-10-CM

## 2022-09-05 DIAGNOSIS — M542 Cervicalgia: Secondary | ICD-10-CM | POA: Diagnosis not present

## 2022-09-06 DIAGNOSIS — M25551 Pain in right hip: Secondary | ICD-10-CM | POA: Diagnosis not present

## 2022-09-06 DIAGNOSIS — M13851 Other specified arthritis, right hip: Secondary | ICD-10-CM | POA: Diagnosis not present

## 2022-09-07 DIAGNOSIS — D649 Anemia, unspecified: Secondary | ICD-10-CM | POA: Diagnosis not present

## 2022-09-07 DIAGNOSIS — E785 Hyperlipidemia, unspecified: Secondary | ICD-10-CM | POA: Diagnosis not present

## 2022-09-12 DIAGNOSIS — D649 Anemia, unspecified: Secondary | ICD-10-CM | POA: Diagnosis not present

## 2022-09-12 DIAGNOSIS — I10 Essential (primary) hypertension: Secondary | ICD-10-CM | POA: Diagnosis not present

## 2022-09-12 DIAGNOSIS — N183 Chronic kidney disease, stage 3 unspecified: Secondary | ICD-10-CM | POA: Diagnosis not present

## 2022-09-12 DIAGNOSIS — E785 Hyperlipidemia, unspecified: Secondary | ICD-10-CM | POA: Diagnosis not present

## 2022-09-12 DIAGNOSIS — R7303 Prediabetes: Secondary | ICD-10-CM | POA: Diagnosis not present

## 2022-09-12 DIAGNOSIS — N390 Urinary tract infection, site not specified: Secondary | ICD-10-CM | POA: Diagnosis not present

## 2022-09-12 DIAGNOSIS — I2510 Atherosclerotic heart disease of native coronary artery without angina pectoris: Secondary | ICD-10-CM | POA: Diagnosis not present

## 2022-09-15 DIAGNOSIS — M25551 Pain in right hip: Secondary | ICD-10-CM | POA: Diagnosis not present

## 2022-11-21 DIAGNOSIS — M25512 Pain in left shoulder: Secondary | ICD-10-CM | POA: Diagnosis not present

## 2022-11-21 DIAGNOSIS — I2510 Atherosclerotic heart disease of native coronary artery without angina pectoris: Secondary | ICD-10-CM | POA: Diagnosis not present

## 2022-11-21 DIAGNOSIS — E119 Type 2 diabetes mellitus without complications: Secondary | ICD-10-CM | POA: Diagnosis not present

## 2022-11-21 DIAGNOSIS — I340 Nonrheumatic mitral (valve) insufficiency: Secondary | ICD-10-CM | POA: Diagnosis not present

## 2022-12-16 IMAGING — DX DG CHEST 2V
2 series · 2 of 2 positions shown · non-contrast
Comparison: 04/08/2018

CLINICAL DATA: Productive cough, midline chest pain for 2 weeks

EXAM:
CHEST - 2 VIEW

[dg chest 2 view (1 of 2)]
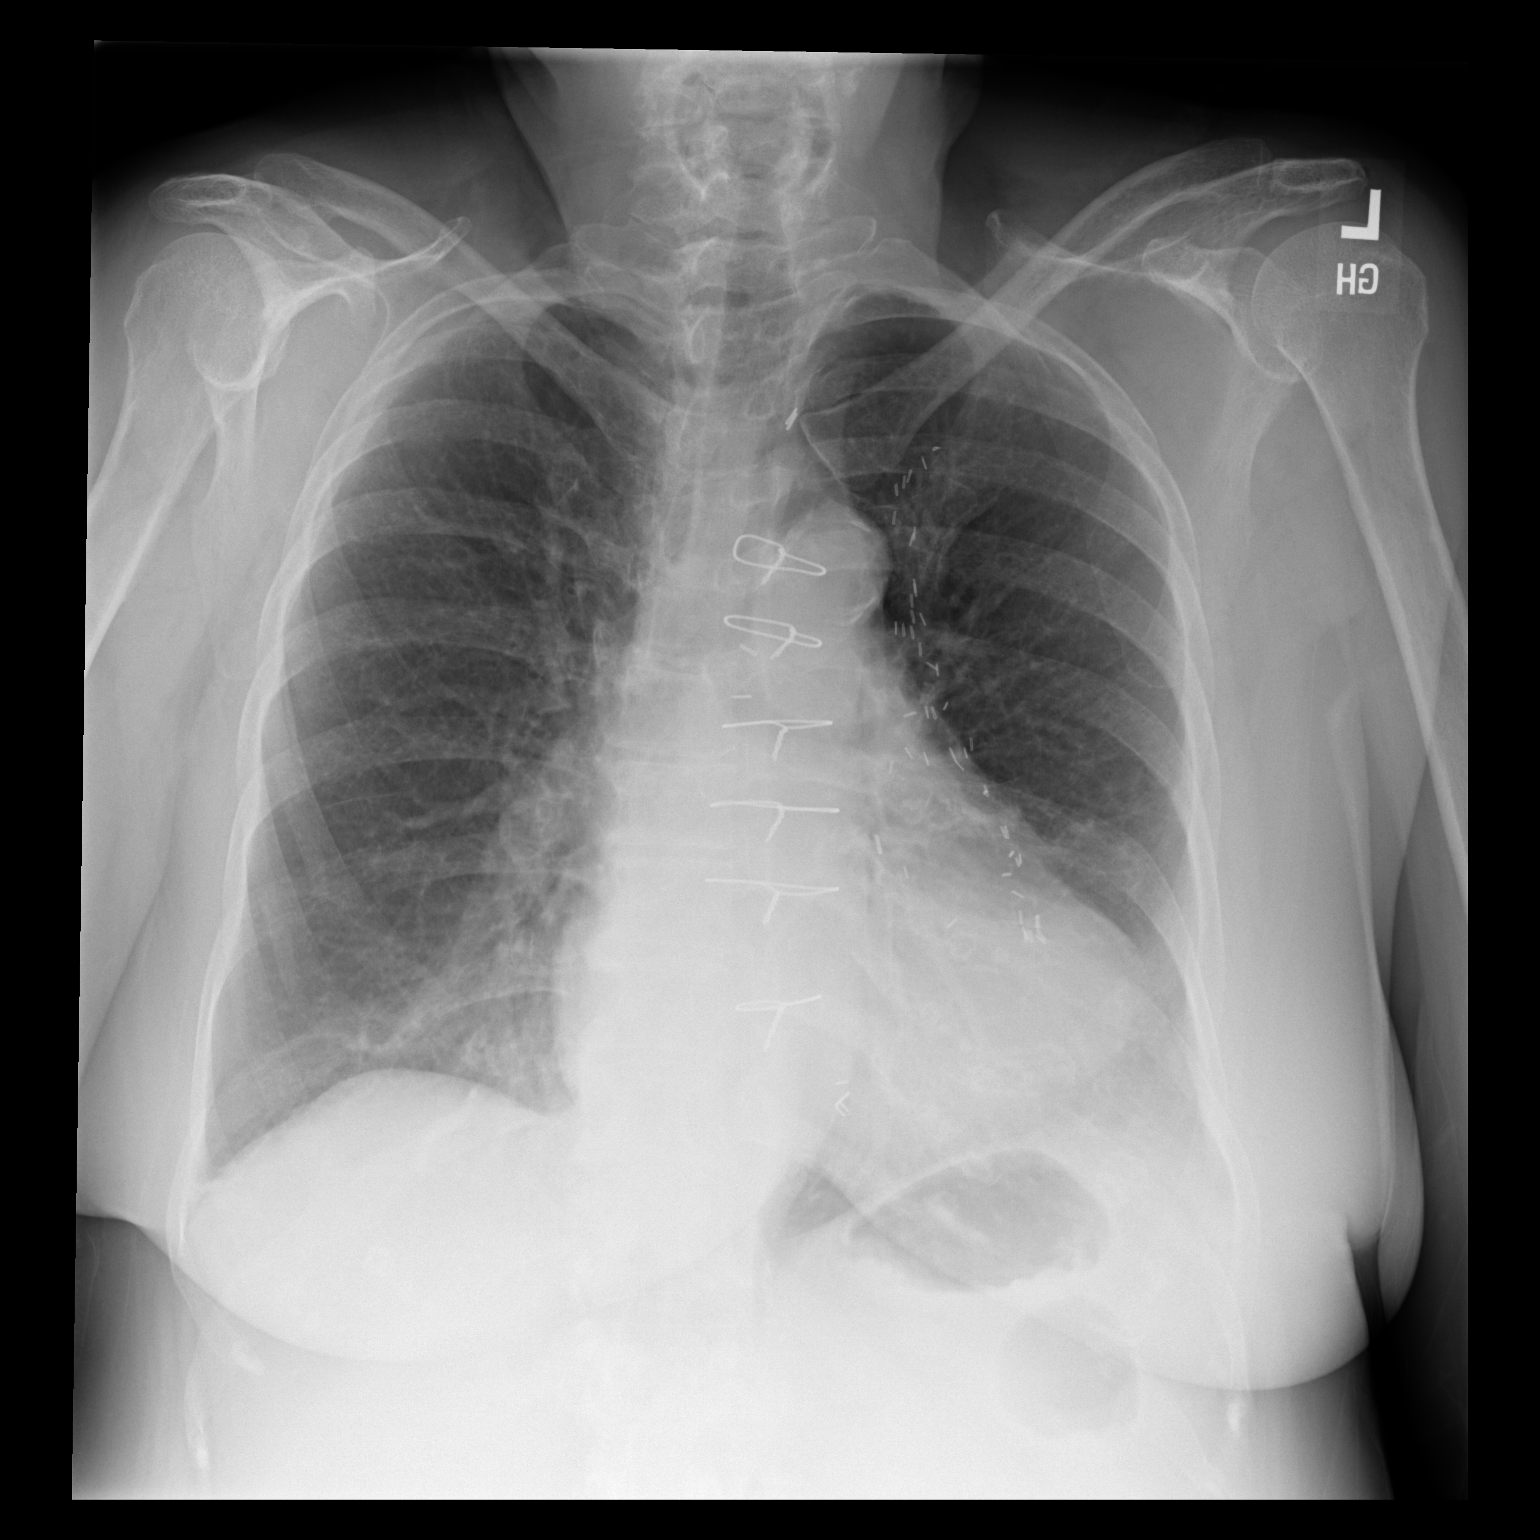

[dg chest 2 view (2 of 2)]
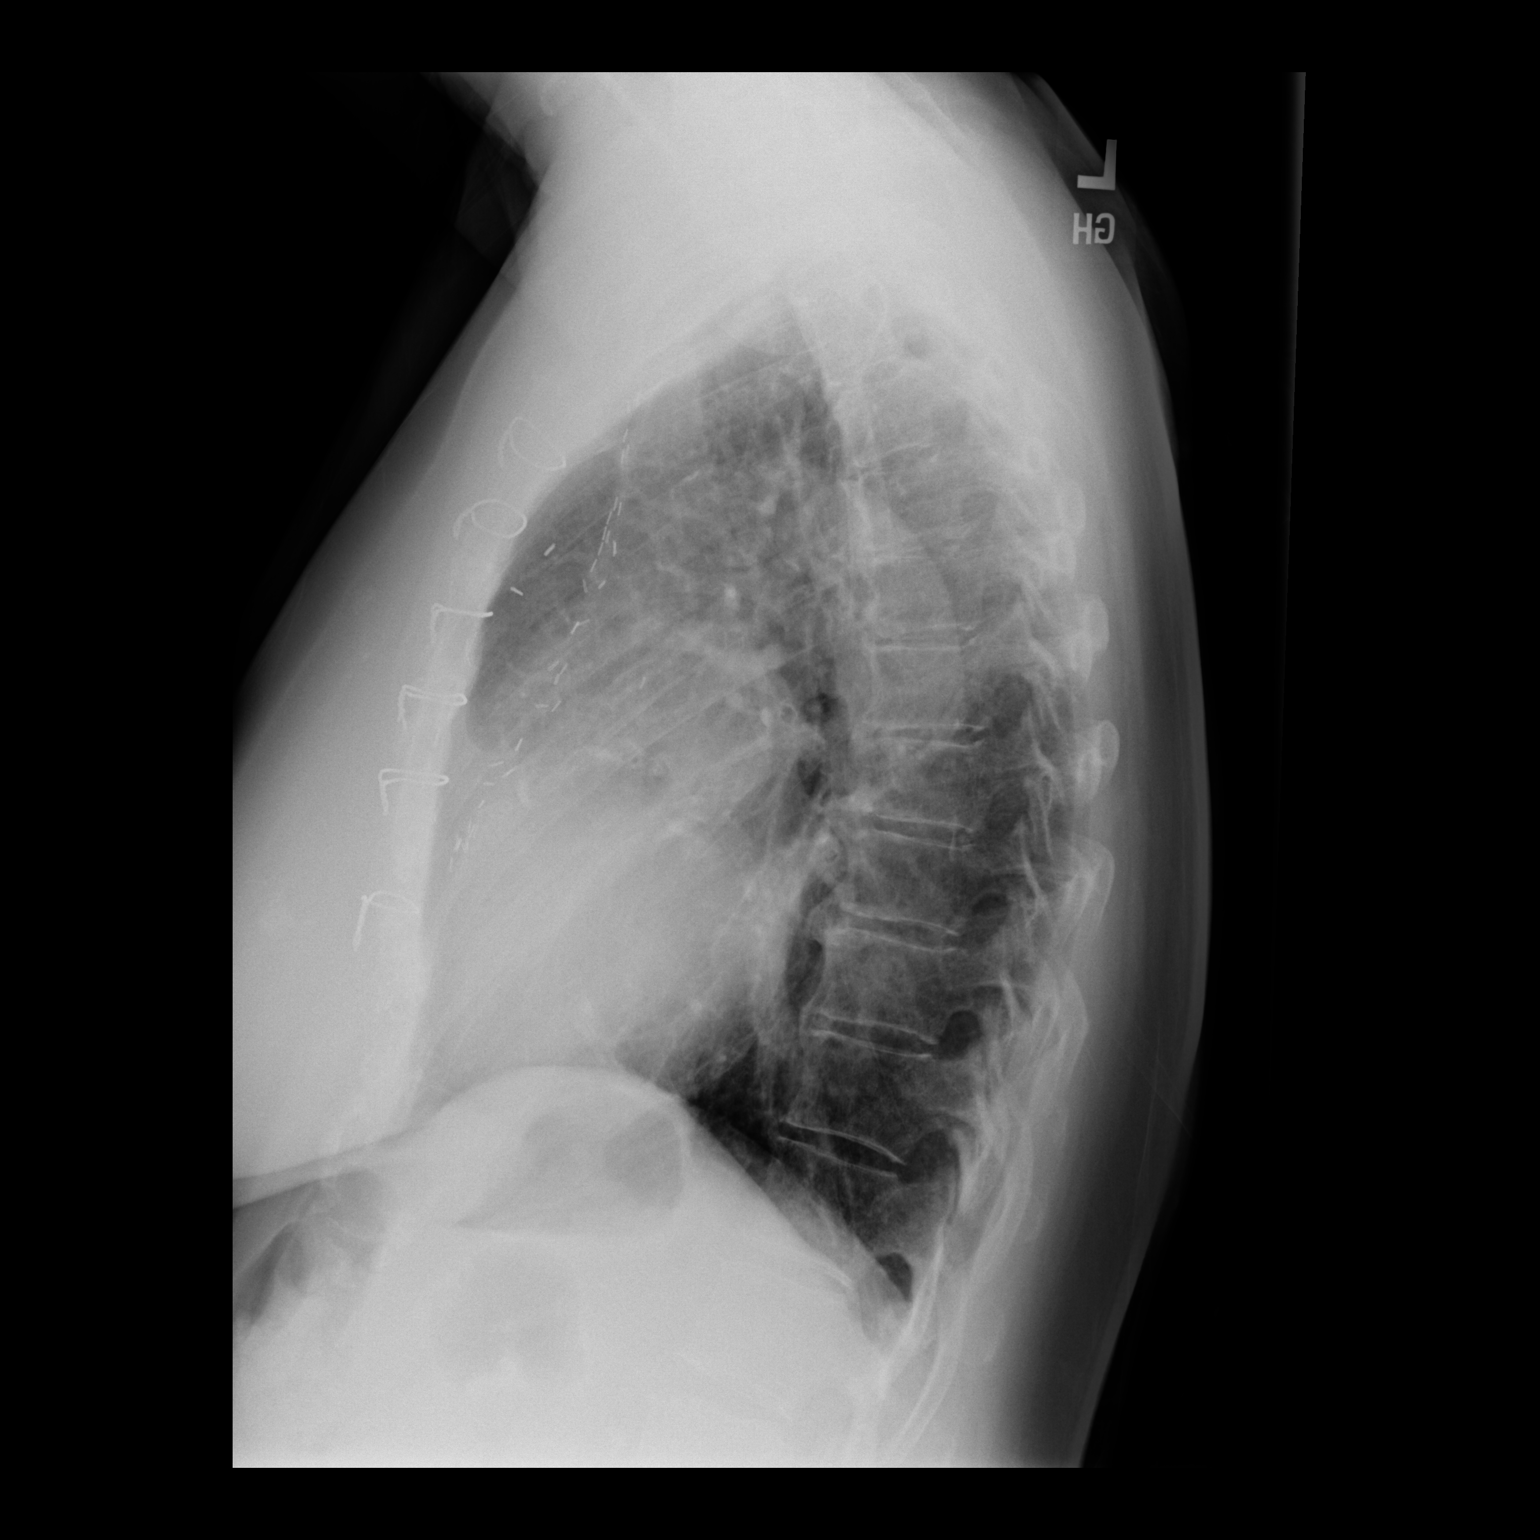

[2 of 2 positions shown; findings below may reference images not displayed]

FINDINGS: Frontal and lateral views of the chest demonstrate postsurgical
changes from median sternotomy. The cardiac silhouette is enlarged
but stable. No acute airspace disease, effusion, or pneumothorax. No
acute bony abnormalities.
IMPRESSION: 1. Stable chest, no acute process.

## 2022-12-20 DIAGNOSIS — B9689 Other specified bacterial agents as the cause of diseases classified elsewhere: Secondary | ICD-10-CM | POA: Diagnosis not present

## 2022-12-20 DIAGNOSIS — R051 Acute cough: Secondary | ICD-10-CM | POA: Diagnosis not present

## 2022-12-20 DIAGNOSIS — J019 Acute sinusitis, unspecified: Secondary | ICD-10-CM | POA: Diagnosis not present

## 2022-12-20 DIAGNOSIS — J208 Acute bronchitis due to other specified organisms: Secondary | ICD-10-CM | POA: Diagnosis not present

## 2022-12-25 DIAGNOSIS — R519 Headache, unspecified: Secondary | ICD-10-CM | POA: Diagnosis not present

## 2022-12-25 DIAGNOSIS — J019 Acute sinusitis, unspecified: Secondary | ICD-10-CM | POA: Diagnosis not present

## 2022-12-25 DIAGNOSIS — B9689 Other specified bacterial agents as the cause of diseases classified elsewhere: Secondary | ICD-10-CM | POA: Diagnosis not present

## 2022-12-25 DIAGNOSIS — J208 Acute bronchitis due to other specified organisms: Secondary | ICD-10-CM | POA: Diagnosis not present

## 2023-02-25 DIAGNOSIS — I2510 Atherosclerotic heart disease of native coronary artery without angina pectoris: Secondary | ICD-10-CM | POA: Diagnosis not present

## 2023-02-25 DIAGNOSIS — I340 Nonrheumatic mitral (valve) insufficiency: Secondary | ICD-10-CM | POA: Diagnosis not present

## 2023-03-15 DIAGNOSIS — Z0000 Encounter for general adult medical examination without abnormal findings: Secondary | ICD-10-CM | POA: Diagnosis not present

## 2023-03-20 DIAGNOSIS — Z1231 Encounter for screening mammogram for malignant neoplasm of breast: Secondary | ICD-10-CM | POA: Diagnosis not present

## 2023-03-20 DIAGNOSIS — Z0001 Encounter for general adult medical examination with abnormal findings: Secondary | ICD-10-CM | POA: Diagnosis not present

## 2023-03-20 DIAGNOSIS — E785 Hyperlipidemia, unspecified: Secondary | ICD-10-CM | POA: Diagnosis not present

## 2023-03-20 DIAGNOSIS — I10 Essential (primary) hypertension: Secondary | ICD-10-CM | POA: Diagnosis not present

## 2023-03-20 DIAGNOSIS — Z1211 Encounter for screening for malignant neoplasm of colon: Secondary | ICD-10-CM | POA: Diagnosis not present

## 2023-03-20 DIAGNOSIS — N183 Chronic kidney disease, stage 3 unspecified: Secondary | ICD-10-CM | POA: Diagnosis not present

## 2023-03-20 DIAGNOSIS — Z23 Encounter for immunization: Secondary | ICD-10-CM | POA: Diagnosis not present

## 2023-03-20 DIAGNOSIS — Z13820 Encounter for screening for osteoporosis: Secondary | ICD-10-CM | POA: Diagnosis not present

## 2023-03-20 DIAGNOSIS — Z0000 Encounter for general adult medical examination without abnormal findings: Secondary | ICD-10-CM | POA: Diagnosis not present

## 2023-03-20 DIAGNOSIS — I2510 Atherosclerotic heart disease of native coronary artery without angina pectoris: Secondary | ICD-10-CM | POA: Diagnosis not present

## 2023-03-20 DIAGNOSIS — Z136 Encounter for screening for cardiovascular disorders: Secondary | ICD-10-CM | POA: Diagnosis not present

## 2023-03-20 DIAGNOSIS — R7303 Prediabetes: Secondary | ICD-10-CM | POA: Diagnosis not present

## 2023-03-21 ENCOUNTER — Other Ambulatory Visit: Payer: Self-pay | Admitting: Internal Medicine

## 2023-03-21 DIAGNOSIS — Z13820 Encounter for screening for osteoporosis: Secondary | ICD-10-CM

## 2023-03-25 DIAGNOSIS — N183 Chronic kidney disease, stage 3 unspecified: Secondary | ICD-10-CM | POA: Diagnosis not present

## 2023-03-25 DIAGNOSIS — I2510 Atherosclerotic heart disease of native coronary artery without angina pectoris: Secondary | ICD-10-CM | POA: Diagnosis not present

## 2023-03-25 DIAGNOSIS — Z23 Encounter for immunization: Secondary | ICD-10-CM | POA: Diagnosis not present

## 2023-04-01 DIAGNOSIS — Z23 Encounter for immunization: Secondary | ICD-10-CM | POA: Diagnosis not present

## 2023-04-01 DIAGNOSIS — N183 Chronic kidney disease, stage 3 unspecified: Secondary | ICD-10-CM | POA: Diagnosis not present

## 2023-04-10 DIAGNOSIS — Z23 Encounter for immunization: Secondary | ICD-10-CM | POA: Diagnosis not present

## 2023-04-23 DIAGNOSIS — R000 Tachycardia, unspecified: Secondary | ICD-10-CM | POA: Diagnosis not present

## 2023-04-23 DIAGNOSIS — R55 Syncope and collapse: Secondary | ICD-10-CM | POA: Diagnosis not present

## 2023-04-23 DIAGNOSIS — I499 Cardiac arrhythmia, unspecified: Secondary | ICD-10-CM | POA: Diagnosis not present

## 2023-04-23 DIAGNOSIS — R0789 Other chest pain: Secondary | ICD-10-CM | POA: Diagnosis not present

## 2023-04-23 DIAGNOSIS — R404 Transient alteration of awareness: Secondary | ICD-10-CM | POA: Diagnosis not present

## 2023-11-19 ENCOUNTER — Other Ambulatory Visit: Payer: 59
